# Patient Record
Sex: Male | Born: 1990 | Race: White | Hispanic: No | Marital: Single | Smoking: Never smoker
Health system: Southern US, Community
[De-identification: ages and names within clinical notes are randomized; demographics above are authoritative.]

## PROBLEM LIST (undated history)

## (undated) DIAGNOSIS — S069XAA Unspecified intracranial injury with loss of consciousness status unknown, initial encounter: Secondary | ICD-10-CM

## (undated) DIAGNOSIS — J189 Pneumonia, unspecified organism: Secondary | ICD-10-CM

## (undated) DIAGNOSIS — L0390 Cellulitis, unspecified: Secondary | ICD-10-CM

## (undated) DIAGNOSIS — S069X9A Unspecified intracranial injury with loss of consciousness of unspecified duration, initial encounter: Secondary | ICD-10-CM

## (undated) DIAGNOSIS — R7881 Bacteremia: Secondary | ICD-10-CM

## (undated) DIAGNOSIS — T8579XA Infection and inflammatory reaction due to other internal prosthetic devices, implants and grafts, initial encounter: Secondary | ICD-10-CM

## (undated) HISTORY — PX: PEG TUBE PLACEMENT: SUR1034

## (undated) HISTORY — PX: VENTRICULOPERITONEAL SHUNT: SHX204

## (undated) HISTORY — PX: OTHER SURGICAL HISTORY: SHX169

---
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
## 2011-01-17 LAB — PROTIME-INR: Prothrombin Time: 12.7

## 2011-01-17 LAB — HEMOGLOBIN AND HEMATOCRIT, BLOOD: HCT: 40.1

## 2011-05-14 DIAGNOSIS — G919 Hydrocephalus, unspecified: Secondary | ICD-10-CM | POA: Insufficient documentation

## 2011-05-14 DIAGNOSIS — S069X0S Unspecified intracranial injury without loss of consciousness, sequela: Secondary | ICD-10-CM | POA: Insufficient documentation

## 2011-05-14 DIAGNOSIS — S069X9A Unspecified intracranial injury with loss of consciousness of unspecified duration, initial encounter: Secondary | ICD-10-CM | POA: Insufficient documentation

## 2011-05-14 DIAGNOSIS — S0291XA Unspecified fracture of skull, initial encounter for closed fracture: Secondary | ICD-10-CM | POA: Insufficient documentation

## 2011-05-14 DIAGNOSIS — G911 Obstructive hydrocephalus: Secondary | ICD-10-CM | POA: Insufficient documentation

## 2011-05-14 DIAGNOSIS — R4189 Other symptoms and signs involving cognitive functions and awareness: Secondary | ICD-10-CM | POA: Insufficient documentation

## 2011-05-15 DIAGNOSIS — T7840XA Allergy, unspecified, initial encounter: Secondary | ICD-10-CM | POA: Insufficient documentation

## 2011-05-15 DIAGNOSIS — Z8719 Personal history of other diseases of the digestive system: Secondary | ICD-10-CM | POA: Insufficient documentation

## 2011-12-09 DIAGNOSIS — J189 Pneumonia, unspecified organism: Secondary | ICD-10-CM | POA: Insufficient documentation

## 2011-12-09 HISTORY — DX: Pneumonia, unspecified organism: J18.9

## 2011-12-10 DIAGNOSIS — R7881 Bacteremia: Secondary | ICD-10-CM | POA: Insufficient documentation

## 2011-12-10 HISTORY — DX: Bacteremia: R78.81

## 2012-06-22 ENCOUNTER — Encounter: Payer: Self-pay | Admitting: Pediatrics

## 2012-06-22 ENCOUNTER — Ambulatory Visit (INDEPENDENT_AMBULATORY_CARE_PROVIDER_SITE_OTHER): Payer: Medicaid Other | Admitting: Pediatrics

## 2012-06-22 DIAGNOSIS — G808 Other cerebral palsy: Secondary | ICD-10-CM

## 2012-06-22 NOTE — Procedures (Signed)
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  A 1-1/2 inch noncoring 21-gauge Huebner needle was inserted on the first pass into the reservoir.   The reservoir was drained of 6.5 mL of baclofen placing the reservoir under partial vacuum.  40 mL  (concentration 500 mcg/mL) was placed through a Millipore filter into the reservoir filling it completely.  The reservoir was reprogrammed to reflect a 40 mL volume.  The daily dose is 330.64 mcg per day delivered as a simple continuous infusion. The patient tolerated the procedure well.  The reservoir alarm date is Aug 19, 2012.  He will return on or about that date to empty, refill, and reprogram his intrathecal  baclofen pump.

## 2012-07-30 ENCOUNTER — Telehealth: Payer: Self-pay | Admitting: *Deleted

## 2012-07-30 NOTE — Telephone Encounter (Signed)
Scott Bean left voicemail stating Tegretol level was 11.9 last week at the PCP office.  She states [name] had a seizure last week and one yesterday. 11:53 left message for [name] to call me back to get more information.

## 2012-07-30 NOTE — Telephone Encounter (Signed)
Spoke with Scott Bean.  Last week seizure lasted less than 5 minutes and threw up afterwards.  This morning was less than 5 minutes as well but did not vomit afterwards.  The Tegretol level was done Feb 20th not last week.  The level was 11.9 at that time.  She will be at work after 4pm however she will try to keep her cell phone with her.  The number is [phone].

## 2012-07-30 NOTE — Telephone Encounter (Signed)
[name] called back leaving a voicemail that she was at work and went on break again at [employer].  She will call back then.

## 2012-07-30 NOTE — Telephone Encounter (Signed)
We can't push the carbamazepine any higher.  I'm thinking about starting him on levetiracetam.  I asked mom to call you on Monday to set up a 30 minute visit to discuss changing his medicine.  Talk to me on Monday about how soon we can do that with a regular return visit or if I'm going to have to work him in.Thanks

## 2012-08-02 NOTE — Telephone Encounter (Signed)
Spoke with [name].  They have an appointment on the 13th.  She will discuss the medications at that time.  Spoke with Dr. Sharene Skeans and he agreed.

## 2012-08-02 NOTE — Telephone Encounter (Signed)
Left message for [name] to call me to schedule appointment.  We have openings this week.

## 2012-08-10 DIAGNOSIS — Z79899 Other long term (current) drug therapy: Secondary | ICD-10-CM | POA: Insufficient documentation

## 2012-08-10 DIAGNOSIS — G800 Spastic quadriplegic cerebral palsy: Secondary | ICD-10-CM | POA: Insufficient documentation

## 2012-08-10 DIAGNOSIS — G40309 Generalized idiopathic epilepsy and epileptic syndromes, not intractable, without status epilepticus: Secondary | ICD-10-CM | POA: Insufficient documentation

## 2012-08-10 DIAGNOSIS — G8110 Spastic hemiplegia affecting unspecified side: Secondary | ICD-10-CM | POA: Insufficient documentation

## 2012-08-17 ENCOUNTER — Ambulatory Visit (INDEPENDENT_AMBULATORY_CARE_PROVIDER_SITE_OTHER): Payer: Medicaid Other | Admitting: Pediatrics

## 2012-08-17 DIAGNOSIS — G8110 Spastic hemiplegia affecting unspecified side: Secondary | ICD-10-CM

## 2012-08-17 DIAGNOSIS — G40309 Generalized idiopathic epilepsy and epileptic syndromes, not intractable, without status epilepticus: Secondary | ICD-10-CM

## 2012-08-17 MED ORDER — LEVETIRACETAM 100 MG/ML PO SOLN: ORAL | Status: DC

## 2012-08-18 ENCOUNTER — Encounter: Payer: Self-pay | Admitting: Pediatrics

## 2012-08-18 NOTE — Progress Notes (Signed)
[name] had two seizures both of which lasted less than five minutes.  One was associated with vomiting and the other was not.  These were generalized tonic-clonic events.  The patient had a Tegretol level done on May 27, 2012, which was 11.9 mcg/mL.  I do not believe that we can increase his dose without causing significant side effects.  I discussed the medication levetiracetam and major side effects, which include problems with behavior.  They may benefits to the mediation including its elimination by the kidneys, which significantly lessens interactions between other medications and also the fact that it can be given intravenously.  We will steadily increase his dose by 500 mg each week and divide it morning and evening.  No changes will be made in carbamazepine.  The patient's baclofen pump was emptied, refilled, and reprogramed and this will be described below.  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 A 1-1/2 inch noncoring 21-gauge Huebner needle was inserted on the first pass into the reservoir. The reservoir was drained of 6.5 mL of baclofen placing the reservoir under partial vacuum. 40 mL (concentration 500 mcg/mL) was placed through a Millipore filter into the reservoir filling it completely. The reservoir was reprogrammed to reflect a 40 mL volume. The daily dose is 330.64 mcg per day delivered as a simple continuous infusion. The patient tolerated the procedure well. The reservoir alarm date is October 14, 2012. He will return on or about that date to empty, refill, and reprogram his intrathecal baclofen pump.  Assessment: 1. 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 2. 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  No change in carbamazepine.  At some point if levetiracetam does an excellent job with controlling seizures, we may consider tapering and discontinuing the carbamazepine.  I decreased the alarm volume to 1 mL from 2 mL because so much baclofen is being discarded at the end of each refill.  The patient is stable as far as his spastic quadriparesis is concerned.  We will need to be more aggressive with his seizure treatment.  I do not want this to become another significant medical problem for him.  I do not know why he is having it now.  Physically he is doing well and looked well.  I spent 15 minutes of face-to-face time with him in addition to the time programing the baclofen pump.    Deetta Perla MD

## 2012-08-18 NOTE — Patient Instructions (Signed)
We will introduce levetiracetam to see if we can improve his seizure control.  If this is successful, we may be able to decrease the dose of carbamazepine.  He returns to see me on or about September 14, 2012.

## 2012-08-23 ENCOUNTER — Other Ambulatory Visit: Payer: Self-pay | Admitting: Family

## 2012-09-06 ENCOUNTER — Telehealth: Payer: Self-pay | Admitting: Family

## 2012-09-06 NOTE — Telephone Encounter (Signed)
I would continue it.  This is of concern, but I don't want to stop it now.

## 2012-09-06 NOTE — Telephone Encounter (Signed)
Mom called to report that since starting the Levetiracetam, he did well on 1/2 teaspoon but has been quiet, not laughing or smiling any more on a whole teaspoon. He is not sleepy and has not had any seizures. He started the whole teaspoon twice daily on Friday 09/03/12. Mom asked if she could continue the whole teaspoon dose? TG

## 2012-09-06 NOTE — Telephone Encounter (Signed)
I called Mom with instructions. TG

## 2012-10-14 ENCOUNTER — Ambulatory Visit (INDEPENDENT_AMBULATORY_CARE_PROVIDER_SITE_OTHER): Payer: Medicaid Other | Admitting: Pediatrics

## 2012-10-14 DIAGNOSIS — G8110 Spastic hemiplegia affecting unspecified side: Secondary | ICD-10-CM

## 2012-10-14 DIAGNOSIS — G40309 Generalized idiopathic epilepsy and epileptic syndromes, not intractable, without status epilepticus: Secondary | ICD-10-CM

## 2012-10-15 ENCOUNTER — Encounter: Payer: Self-pay | Admitting: Pediatrics

## 2012-10-15 NOTE — Progress Notes (Signed)
[name] has been seizure free since his last visit.  He is taking and tolerating levetiracetam without significant side effects.  He is involved in a swimming class this summer.  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 A 1-1/2 inch noncoring 21-gauge Huebner needle was inserted on the first pass into the reservoir. The reservoir was drained of 6.0 mL of baclofen placing the reservoir under partial vacuum. 40 mL (concentration 500 mcg/mL) was placed through a Millipore filter into the reservoir filling it completely. The reservoir was reprogrammed to reflect a 40 mL volume. The daily dose is 330.64 mcg per day delivered as a simple continuous infusion. The patient tolerated the procedure well. The reservoir alarm date is December 11, 2012. He will return on or about that date to empty, refill, and reprogram his intrathecal baclofen pump.   Assessment 1.  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 2.  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 3.  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  His pump is working well and spasticity is stable.  No changes will be made in his antiepileptic drugs.  Deetta Perla MD

## 2012-10-20 ENCOUNTER — Telehealth: Payer: Self-pay | Admitting: *Deleted

## 2012-10-20 NOTE — Telephone Encounter (Signed)
I called Mom back and talked with her about the fish oil for traumatic brain injury therapy. I told her that it was experimental and that for that reason, I did not have specific information to give her. I told her that if she chose to give it, that she should monitor him carefully for side effects of GI upset and evidences of bleeding and bruising. She said that she was giving him 1000 mg of fish oil daily now and planned to gradually increase the dose to see if it helped and how he tolerated it. I asked her to stay in touch and let us know how he was doing. TG

## 2012-10-20 NOTE — Telephone Encounter (Signed)
I agree with your advice and recommendations.  I think that it unlikely to help and that he won't tolerate such a high dose.

## 2012-10-20 NOTE — Telephone Encounter (Signed)
Scott Bean the patient's mom called and stated that she had read where a patient who had a brain injury mother is giving him high doses of fish oil she believes 20 grams is what she read in the article and that patient's mom stated that it seems to be helping the patient with the brain injury, Scott Bean is wanting to know if this may be an option for her to try with Scott Bean if it's safe or not. Scott Bean would like a call back to discuss this matter on her mobile at [phone].    Thanks,  Belenda Cruise.

## 2012-12-10 ENCOUNTER — Ambulatory Visit (INDEPENDENT_AMBULATORY_CARE_PROVIDER_SITE_OTHER): Payer: Medicaid Other | Admitting: Pediatrics

## 2012-12-10 DIAGNOSIS — G8110 Spastic hemiplegia affecting unspecified side: Secondary | ICD-10-CM
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
## 2012-12-27 ENCOUNTER — Encounter: Payer: Self-pay | Admitting: Family

## 2012-12-27 ENCOUNTER — Telehealth: Payer: Self-pay | Admitting: *Deleted

## 2012-12-27 ENCOUNTER — Ambulatory Visit (INDEPENDENT_AMBULATORY_CARE_PROVIDER_SITE_OTHER): Payer: Medicaid Other | Admitting: Family

## 2012-12-27 DIAGNOSIS — L0390 Cellulitis, unspecified: Secondary | ICD-10-CM

## 2012-12-27 DIAGNOSIS — L0291 Cutaneous abscess, unspecified: Secondary | ICD-10-CM

## 2012-12-27 HISTORY — DX: Cellulitis, unspecified: L03.90

## 2012-12-27 MED ORDER — VANCOMYCIN HCL 50 MG/ML PO SOLN: 17.0000 mL | Freq: Two times a day (BID) | ORAL | Status: DC

## 2012-12-27 NOTE — Telephone Encounter (Signed)
Mom brought Lerna in today. See office note. TG

## 2012-12-27 NOTE — Telephone Encounter (Signed)
Yes, We need to take a look at it.

## 2012-12-27 NOTE — Telephone Encounter (Signed)
Mom called back and said that she may be able to bring him today but needs a fax sent to transportation service saying that it was a medical necessity. She asked for the note to be faxed to [facility] @ [phone]. I faxed the note as requested. TG

## 2012-12-27 NOTE — Patient Instructions (Signed)
Start Vancomycin 17ml via tube twice per day.  Call on Weds 12/29/12 to let us know how he is doing. Call sooner if needed.  Dr Sharene Skeans will contact Dr Samson Frederic to discuss the cellulitis over the Baclofen site. [name] may need to be seen by Dr Samson Frederic if this does not improve.

## 2012-12-27 NOTE — Telephone Encounter (Signed)
[name] the patient's mom called and stated that the patient has been sick with fever for a few day last week and that normally when he has a fever his pump site will turn red, well he is a little better now not running a fever at all and his pump site is red and swollen and mom is wanting to know what she should do if anything and if this is okay that this is still looking the way that it is. [name] would like a return call to discuss this matter at [phone].      Thanks,  Belenda Cruise.

## 2012-12-27 NOTE — Telephone Encounter (Signed)
Dr Sharene Skeans, do you want me to bring [facility] in (on my schedule) today to view the red, swollen pump site? Inetta Fermo

## 2012-12-27 NOTE — Progress Notes (Signed)
[name] returns today on urgent basis because his Mother called today to report that he had redness and swelling over his pump site. Mom said that he had fever at the end of last week and when the fever ended that he had redness and swelling over the pump site. He has not had increase in spasticity.  I asked her to bring him in so that we could evaluate him. Upon examination, the pump site has erythema, edema and warmth directly over the site, in a diameter of approximately 4 inches. He cries and grimaces when the pump site is palpated. I consulted with Dr. Sharene Skeans regarding this patient. He came in to see the patient and talked with his mother. Dr Sharene Skeans explained that [name] has symptoms of cellulitis on his right lower abdomen over the pump site. We will start him on Vancomycin for 10 days to treat the infection. Mom was instructed to call on Wednesday 12/29/12 to report on his condition or sooner if needed. Dr Sharene Skeans plans to call Dr Samson Frederic at [facility] to discuss the case with him. I called [facility] and consulted a pharmacist regarding an appropriate dose for the Vancomycin, which will be given through the gastrostomy tube. The pharmacist recommended 30mg /kg of Vancomycin 10m/ml. I sent the order to [facility], who will compound the medication for the patient. Mom knows to call if [name]'s condition worsens, if he becomes lethargic, very restless or in any way behaving differently than usual.

## 2012-12-27 NOTE — Telephone Encounter (Signed)
I called Mom and asked her to bring [place] in. She will see if she can get a ride and call me back. TG

## 2012-12-28 ENCOUNTER — Telehealth: Payer: Self-pay | Admitting: Pediatrics

## 2012-12-28 ENCOUNTER — Encounter: Payer: Self-pay | Admitting: *Deleted

## 2012-12-28 NOTE — Telephone Encounter (Signed)
Mother says that he is better today.  His is day 2 of vancomycin.  I contacted Dr. Fredric Dine at [facility] and he recommended an evaluation in the office on Thursday.  Telephone [phone] Tresa Endo).  Asked mother to call and make an appointment.  If the cellulitis is substantially better, he may not need the appointment.  I told mother to make pictures each day of the lesion so that we could see its progression.

## 2012-12-29 ENCOUNTER — Telehealth: Payer: Self-pay | Admitting: *Deleted

## 2012-12-29 NOTE — Telephone Encounter (Signed)
Noted, agree with plan . No additional action taken.

## 2012-12-29 NOTE — Telephone Encounter (Signed)
[name] the patient's mom called and has said that since using the cream on Scott Bean the pump site looks better however she still is going to keep the appointment tomorrow with Dr. Samson Frederic to be on the safe side and have him check it out. If Dr. Sharene Skeans wants to call her back she can be reached at [phone].      Thanks,  Belenda Cruise.

## 2012-12-30 ENCOUNTER — Encounter: Payer: Self-pay | Admitting: *Deleted

## 2012-12-30 ENCOUNTER — Telehealth: Payer: Self-pay | Admitting: *Deleted

## 2012-12-30 NOTE — Telephone Encounter (Signed)
I tried to see the patient's mother and we were unable to keep a connection.  I agree with this plan.

## 2012-12-30 NOTE — Telephone Encounter (Signed)
[name] the patient's mom called and stated that she and Scott Bean have just left Dr. Manfred Shirts office and Scott Bean is going to have surgery tomorrow at 9:00 am to replace the pump and put it on the other side of his stomach he will then have to stay for 2 to 3 weeks for antibiotic treatment. Mom can be reached at [phone].      Thanks,  Belenda Cruise.

## 2013-01-05 ENCOUNTER — Encounter: Payer: Self-pay | Admitting: Family

## 2013-01-05 NOTE — Telephone Encounter (Signed)
This encounter was created in error - please disregard.

## 2013-01-10 ENCOUNTER — Encounter: Payer: Self-pay | Admitting: *Deleted

## 2013-01-14 ENCOUNTER — Other Ambulatory Visit: Payer: Self-pay | Admitting: Family

## 2013-01-14 DIAGNOSIS — G40309 Generalized idiopathic epilepsy and epileptic syndromes, not intractable, without status epilepticus: Secondary | ICD-10-CM

## 2013-01-25 ENCOUNTER — Encounter: Payer: Self-pay | Admitting: Pediatrics

## 2013-01-25 ENCOUNTER — Ambulatory Visit (INDEPENDENT_AMBULATORY_CARE_PROVIDER_SITE_OTHER): Payer: Medicaid Other | Admitting: Pediatrics

## 2013-01-25 DIAGNOSIS — G40309 Generalized idiopathic epilepsy and epileptic syndromes, not intractable, without status epilepticus: Secondary | ICD-10-CM

## 2013-01-25 DIAGNOSIS — G8110 Spastic hemiplegia affecting unspecified side: Secondary | ICD-10-CM
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
## 2013-01-26 ENCOUNTER — Encounter: Payer: Self-pay | Admitting: Pediatrics

## 2013-02-04 ENCOUNTER — Encounter: Payer: Medicaid Other | Admitting: Pediatrics

## 2013-02-06 DIAGNOSIS — T8579XA Infection and inflammatory reaction due to other internal prosthetic devices, implants and grafts, initial encounter: Secondary | ICD-10-CM

## 2013-02-06 HISTORY — DX: Infection and inflammatory reaction due to other internal prosthetic devices, implants and grafts, initial encounter: T85.79XA

## 2013-03-08 ENCOUNTER — Encounter: Payer: Self-pay | Admitting: Pediatrics

## 2013-03-08 ENCOUNTER — Ambulatory Visit (INDEPENDENT_AMBULATORY_CARE_PROVIDER_SITE_OTHER): Payer: Medicaid Other | Admitting: Pediatrics

## 2013-03-08 DIAGNOSIS — G8110 Spastic hemiplegia affecting unspecified side: Secondary | ICD-10-CM

## 2013-03-08 DIAGNOSIS — G40309 Generalized idiopathic epilepsy and epileptic syndromes, not intractable, without status epilepticus: Secondary | ICD-10-CM

## 2013-03-08 NOTE — Progress Notes (Signed)
Patient: Scott Bean MRN: [id number] Sex: male DOB: 1990/11/10  Provider: Deetta Perla, MD Location of Care: [facility] Child Neurology  Note type: Routine return visit  History of Present Illness: Referral Source: Dr. Delaney Meigs History from: mother and [facility] chart Chief Complaint: Baclofen Pump Refill  Scott Bean is a 22 y.o. male who returns for evaluation and management of his generalized spasticity treated with an intrathecal baclofen pump.  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 A 1-1/2 inch noncoring 21-gauge Huebner needle was inserted on the third pass into the reservoir. The reservoir was drained of 3.0 mL of baclofen placing the reservoir under partial vacuum. 40 mL (concentration 500 mcg/mL) was placed through a Millipore filter into the reservoir filling it completely. The reservoir was reprogrammed to reflect a 40 mL volume. The daily dose was increased to 347.52 mcg per day delivered as a simple continuous infusion.(5% increased do to increase spasticity in his legs) The patient tolerated the procedure well. The reservoir alarm date is May 03, 2013.  He will return on or about that day to empty, refill, and reprogram his intrathecal baclofen pump.  His catheter is now at T2. Estimated ERI 79 months.   Review of Systems: 12 system review was unremarkable  No past medical history on file. Hospitalizations: yes, Head Injury: no, Nervous System Infections: no, Immunizations up to date: yes Past Medical History Comments: See surgical Hx for hospitalizations.  Behavior History none  Surgical History Past Surgical History  Procedure Laterality Date  . Decompressive craniotomy    . Peg tube placement    . Ventriculoperitoneal shunt    . Implanation of  intrathecal baclofen pump    . Reimplantation of intrathecal baclofen pump      Family History family history includes Heart Problems in his maternal grandfather. Family History is negative migraines, seizures, cognitive impairment, blindness, deafness, birth defects, chromosomal disorder, autism.  Social History History   Social History  . Marital Status: Single    Spouse Name: N/A    Number of Children: N/A  . Years of Education: N/A   Social History Main Topics  . Smoking status: Never Smoker   . Smokeless tobacco: Never Used  . Alcohol Use: No  . Drug Use: No  . Sexual Activity: No   Other Topics Concern  . None   Social History Narrative  . None   Educational level 12th grade School Attending: [employer]. Occupation: Consulting civil engineer  Living with mother and maternal grandmother  Hobbies/Interest: Horses School comments [name] is doing well in school he will graduate from [employer] in June 2015.  Current Outpatient Prescriptions on File Prior to Visit  Medication Sig Dispense Refill  . aluminum-magnesium hydroxide-simethicone (MAALOX) 200-200-20 MG/5ML SUSP       . carBAMazepine (TEGRETOL) 100 MG/5ML suspension 100 mg/kg/day. 16.76ml per PEG four times daily      . carBAMazepine (TEGRETOL) 100 MG/5ML suspension TAKE 16.5 ML BY MOUTH EVERY 6 HOURS  2245 mL  5  . cetirizine HCl (ZYRTEC) 5 MG/5ML SYRP 1 mg. 1 mg by Tube route daily as needed.      . cloNIDine (CATAPRES - DOSED IN MG/24 HR) 0.1 mg/24hr patch 1 patch. Place 1 patch onto the skin once a week. On Sunday.      . levETIRAcetam (KEPPRA) 100 MG/ML  solution 2.5 mL twice daily x1 week, then 5 mL twice daily x1 week, then 7.5 mL twice daily  500 mL  5  . Omega-3 Fatty Acids (OMEGA-3 FISH OIL) 1200 MG CAPS 2 capsules. 2 capsules by PEG Tube route daily. Pierce capsules and mix with medications as directed      . ranitidine (ZANTAC) 15 MG/ML syrup 150 mg. 150 mg by PEG Tube route 2 times daily.      Marland Kitchen  tetrahydrozoline 0.05 % ophthalmic solution 1 drop. Place 1 drop into the left eye 3 times daily. As directed.      . Vancomycin HCl 50 MG/ML SOLN Take 17 mLs by mouth 2 (two) times daily. Give 17 ml via tube twice per day for 10 days      . Vitamin D, Ergocalciferol, (DRISDOL) 50000 UNITS CAPS capsule 50,000 Units by PEG Tube route once a week. On Sunday       No current facility-administered medications on file prior to visit.   The medication list was reviewed and reconciled. All changes or newly prescribed medications were explained.  A complete medication list was provided to the patient/caregiver.  No Known Allergies  Physical Exam There were no vitals taken for this visit.  Assessment Spastic hemiplegia affecting the nondominant and dominant side secondary to traumatic brain injury. 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

## 2013-03-09 ENCOUNTER — Other Ambulatory Visit: Payer: Self-pay | Admitting: Family

## 2013-03-09 DIAGNOSIS — G40309 Generalized idiopathic epilepsy and epileptic syndromes, not intractable, without status epilepticus: Secondary | ICD-10-CM

## 2013-03-09 MED ORDER — LEVETIRACETAM 100 MG/ML PO SOLN: ORAL | Status: DC

## 2013-05-02 ENCOUNTER — Encounter: Payer: Self-pay | Admitting: Pediatrics

## 2013-05-02 ENCOUNTER — Ambulatory Visit (INDEPENDENT_AMBULATORY_CARE_PROVIDER_SITE_OTHER): Payer: Medicaid Other | Admitting: Pediatrics

## 2013-05-02 DIAGNOSIS — G8110 Spastic hemiplegia affecting unspecified side: Secondary | ICD-10-CM

## 2013-05-02 NOTE — Progress Notes (Signed)
Patient: Scott Bean MRN: [id number] Sex: male DOB: 10-29-1990  Provider: Deetta Perla, MD Location of Care: [facility] Neurology  Note type: Routine return visit  History of Present Illness: Referral Source: Dr. Delaney Meigs History from: mother and [facility] chart Chief Complaint: Baclofen Pump Refill  Scott Bean is a 23 y.o. male Who returns for evaluation and management of spastic double hemi-paresisfrom traumatic brain injury.  He is here to empty refill and reprogram intrathecal Baclofen pump.  His mother tells me that since increasing his dose last time, that the left leg remains very stiff and rigid.  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 A 1-1/2 inch noncoring 21-gauge Huebner needle was inserted on the fourth pass into the reservoir. The reservoir was drained of 3.2 mL of baclofen placing the reservoir under partial vacuum. 40 mL (concentration 500 mcg/mL) was placed through a Millipore filter into the reservoir filling it completely. The reservoir was reprogrammed to reflect a 40 mL volume. The daily dose was increased to 380.03 mcg delivered as a simple continuous infusion.(9% increased do to increased spasticity/rigidity in his left leg) The patient tolerated the procedure well. The reservoir alarm date is June 22, 2013. He will return on or about that day to empty, refill, and reprogram his intrathecal baclofen pump. His catheter is now at T2.  Estimated ERI 77 months.  Asked his mother to shave the central area were the reservoir nipple exists on the day of the procedure to decrease the chances of contamination.  Review of Systems: 12 system review was unremarkable  No past medical history on file. Hospitalizations: yes, Head Injury: no, Nervous System  Infections: no, Immunizations up to date: yes Past Medical History Comments: See surgical Hx for hospitalizations.  Behavior History none  Surgical History Past Surgical History  Procedure Laterality Date  . Decompressive craniotomy    . Peg tube placement    . Ventriculoperitoneal shunt    . Implanation of intrathecal baclofen pump    . Reimplantation of intrathecal baclofen pump      Family History family history includes Heart Problems in his maternal grandfather. Family History is negative migraines, seizures, cognitive impairment, blindness, deafness, birth defects, chromosomal disorder, autism.  Social History History   Social History  . Marital Status: Single    Spouse Name: N/A    Number of Children: N/A  . Years of Education: N/A   Social History Main Topics  . Smoking status: Never Smoker   . Smokeless tobacco: Never Used  . Alcohol Use: No  . Drug Use: No  . Sexual Activity: No   Other Topics Concern  . None   Social History Narrative  . None   Educational level 12th grade special education School Attending: [employer]. Occupation: Consulting civil engineer  Living with mother and maternal grandmother  Hobbies/Interest: Enjoys horse and water therapy. School comments [name] will graduate from [employer] in June of 2015.  Current Outpatient Prescriptions on File Prior to Visit  Medication Sig Dispense Refill  . aluminum-magnesium hydroxide-simethicone (MAALOX) 200-200-20 MG/5ML SUSP       . carBAMazepine (TEGRETOL) 100 MG/5ML suspension 100 mg/kg/day. 16.70ml per PEG four times daily      . carBAMazepine (TEGRETOL) 100 MG/5ML suspension TAKE 16.5 ML BY MOUTH EVERY 6 HOURS  2245 mL  5  . cetirizine  HCl (ZYRTEC) 5 MG/5ML SYRP 1 mg. 1 mg by Tube route daily as needed.      . cloNIDine (CATAPRES - DOSED IN MG/24 HR) 0.1 mg/24hr patch 1 patch. Place 1 patch onto the skin once a week. On Sunday.      . levETIRAcetam (KEPPRA) 100 MG/ML solution Give 7.5 mL  twice daily  500 mL  5  . Omega-3 Fatty Acids (OMEGA-3 FISH OIL) 1200 MG CAPS 2 capsules. 2 capsules by PEG Tube route daily. Pierce capsules and mix with medications as directed      . ranitidine (ZANTAC) 15 MG/ML syrup 150 mg. 150 mg by PEG Tube route 2 times daily.      Marland Kitchen. tetrahydrozoline 0.05 % ophthalmic solution 1 drop. Place 1 drop into the left eye 3 times daily. As directed.      . Vancomycin HCl 50 MG/ML SOLN Take 17 mLs by mouth 2 (two) times daily. Give 17 ml via tube twice per day for 10 days      . Vitamin D, Ergocalciferol, (DRISDOL) 50000 UNITS CAPS capsule 50,000 Units by PEG Tube route once a week. On Sunday       No current facility-administered medications on file prior to visit.   The medication list was reviewed and reconciled. All changes or newly prescribed medications were explained.  A complete medication list was provided to the patient/caregiver.  No Known Allergies  Physical Exam There were no vitals taken for this visit.  Patient has increased rigidity in the left leg I can bend it, but it causes him some pain.  Once I do so, it's easier to move; it still remains at least [place] 3.  Assessment Spastic double hemi-paresis  Emptying refilling and reprogramming of intrathecal Baclofen pump occurred without complications.  Deetta PerlaWilliam H Hickling MD

## 2013-06-16 ENCOUNTER — Other Ambulatory Visit: Payer: Self-pay | Admitting: Family

## 2013-06-21 ENCOUNTER — Ambulatory Visit (INDEPENDENT_AMBULATORY_CARE_PROVIDER_SITE_OTHER): Payer: Medicaid Other | Admitting: Pediatrics

## 2013-06-21 ENCOUNTER — Encounter: Payer: Self-pay | Admitting: Pediatrics

## 2013-06-21 DIAGNOSIS — G8110 Spastic hemiplegia affecting unspecified side: Secondary | ICD-10-CM

## 2013-06-21 NOTE — Progress Notes (Signed)
Patient: Scott Bean MRN: [id number] Sex: male DOB: 02/21/1991  Provider: Deetta PerlaHICKLING,Cobie Leidner H, MD [facility]  Note type: Routine return visit  History of Present Illness: Referral Source: Dr. Delaney MeigsLeonard R. Nyland History from: mother and [facility] chart Chief Complaint: Baclofen Pump Refill  Scott Bean is a 23 y.o. 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  Scott Bean is a 23 y.o. male Who returns for evaluation and management of spastic double hemi-paresisfrom traumatic brain injury. He is here to empty refill and reprogram intrathecal Baclofen pump. His mother tells me that since increasing his dose last time, that the left leg remains very stiff and rigid.   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 A 1-1/2 inch noncoring 21-gauge Huebner needle was inserted after several passes into the reservoir. The reservoir was drained of 2.5 mL of baclofen placing the reservoir under partial vacuum. 40 mL (concentration 500 mcg/mL) was placed through a Millipore filter into the reservoir filling it completely. The reservoir was reprogrammed to reflect a 40 mL volume. The daily dose was increased to 420.22 mcg delivered as a simple continuous infusion.(11% increase due to increased spasticity/rigidity in his left leg) The patient tolerated the procedure well. The reservoir alarm date is Aug 06, 2013. He will return on or about that day to empty, refill, and reprogram his intrathecal baclofen pump. His catheter is now at T2.  Estimated ERI 75 months.  Review of Systems: 12 system review was remarkable for in spasticity in his legs and arms,  preserved vision, limited ability to understand and communicate  No past medical history on file. Hospitalizations: yes, Head Injury: no, Nervous System Infections: no, Immunizations up to date: yes Past Medical History Comments: See Hx for hospitalizations.  Behavior History none  Surgical History Past Surgical History  Procedure Laterality Date  . Decompressive craniotomy    . Peg tube placement    . Ventriculoperitoneal shunt    . Implanation of intrathecal baclofen pump    . Reimplantation of intrathecal baclofen pump      Family History family history includes Heart Problems in his maternal grandfather. Family History is negative migraines, seizures, cognitive impairment, blindness, deafness, birth defects, chromosomal disorder, autism.  Social History History   Social History  . Marital Status: Single    Spouse Name: N/A    Number of Children: N/A  . Years of Education: N/A   Social History Main Topics  . Smoking status: Never Smoker   . Smokeless tobacco: Never Used  . Alcohol Use: No  . Drug Use: No  . Sexual Activity: No   Other Topics Concern  . None   Social History Narrative  . None   Educational level 12th grade special education School Attending: [employer]. Occupation: Consulting civil engineertudent  Living with mother and maternal grandmother  Hobbies/Interest: Enjoys horse and water therapy and being around his friends at school. School comments [name] will graduate June of 2015, he's doing well in school.   Current Outpatient Prescriptions on File Prior to Visit  Medication Sig Dispense Refill  . aluminum-magnesium hydroxide-simethicone (MAALOX) 200-200-20 MG/5ML SUSP       . carBAMazepine (TEGRETOL) 100 MG/5ML suspension 100 mg/kg/day. 16.425ml per PEG four times daily      .  carBAMazepine (TEGRETOL) 100 MG/5ML suspension TAKE 16.5 ML BY MOUTH EVERY 6 HOURS  2244 mL  5  . cetirizine HCl (ZYRTEC) 5 MG/5ML SYRP 1 mg. 1 mg by Tube route daily as needed.      .  cloNIDine (CATAPRES - DOSED IN MG/24 HR) 0.1 mg/24hr patch 1 patch. Place 1 patch onto the skin once a week. On Sunday.      . levETIRAcetam (KEPPRA) 100 MG/ML solution Give 7.5 mL twice daily  500 mL  5  . Omega-3 Fatty Acids (OMEGA-3 FISH OIL) 1200 MG CAPS 2 capsules. 2 capsules by PEG Tube route daily. Pierce capsules and mix with medications as directed      . ranitidine (ZANTAC) 15 MG/ML syrup 150 mg. 150 mg by PEG Tube route 2 times daily.      Marland Kitchen tetrahydrozoline 0.05 % ophthalmic solution 1 drop. Place 1 drop into the left eye 3 times daily. As directed.      . Vancomycin HCl 50 MG/ML SOLN Take 17 mLs by mouth 2 (two) times daily. Give 17 ml via tube twice per day for 10 days      . Vitamin D, Ergocalciferol, (DRISDOL) 50000 UNITS CAPS capsule 50,000 Units by PEG Tube route once a week. On Sunday       No current facility-administered medications on file prior to visit.   The medication list was reviewed and reconciled. All changes or newly prescribed medications were explained.  A complete medication list was provided to the patient/caregiver.  No Known Allergies  Physical Exam There were no vitals taken for this visit. He has severe spasticity in his left leg more so than the right.  Assessment 1.  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  Plan Emptying refilling and reprogramming of intrathecal Baclofen pump occurred without complications.  Deetta Perla MD

## 2013-08-05 ENCOUNTER — Ambulatory Visit (INDEPENDENT_AMBULATORY_CARE_PROVIDER_SITE_OTHER): Payer: Medicaid Other | Admitting: Pediatrics

## 2013-08-05 ENCOUNTER — Encounter: Payer: Self-pay | Admitting: Pediatrics

## 2013-08-05 ENCOUNTER — Encounter: Payer: Medicaid Other | Admitting: Pediatrics

## 2013-08-05 DIAGNOSIS — G8110 Spastic hemiplegia affecting unspecified side: Secondary | ICD-10-CM

## 2013-08-05 NOTE — Progress Notes (Signed)
Patient: Scott Bean MRN: [id number] Sex: male DOB: April 07, 1991  Provider: Deetta PerlaHICKLING,Marielys Trinidad H, MD [facility]  Note type: Routine return visit  History of Present Illness: Referral Source: Dr. Delaney MeigsLeonard R. Nyland [facility] Chief Complaint: Baclofen Pump Refill  Scott Bean is a 23 y.o. male who returns empty, refill, and reprogram his intrathecal baclofen pump.  Scott Bean is a 23 y.o. 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   Scott RodesCody Bean is a 23 y.o. male Who returns for evaluation and management of spastic double hemi-paresisfrom traumatic brain injury. He is here to empty refill and reprogram intrathecal Baclofen pump. His mother tells me that since increasing his dose last time, that the left leg remains very stiff and rigid.   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 A 1-1/2 inch and 3 inch noncoring 21-gauge Huebner needle was inserted after several passes into the reservoir. I had difficulty correctly finding the central reservoir.  The reservoir was drained of 4.5 mL of baclofen placing the reservoir under partial vacuum. 40 mL (concentration 500 mcg/mL) was placed through a Millipore filter into the reservoir filling it completely. The reservoir was reprogrammed to reflect a 40 mL volume. The daily dose was 420.22 mcg delivered as a simple continuous infusion. The patient tolerated the procedure well. The reservoir alarm date is September 20, 2013. He will return on or about that day to empty, refill, and reprogram his intrathecal baclofen pump. His catheter is now at T2. The reservoir has  been marked with a dark Sharpie pen. Estimated ERI 74 months.   Review of Systems: 12 system review was unremarkable  No past medical history on file. Hospitalizations: yes, Head Injury: no, Nervous System Infections: no, Immunizations up to date: yes Past Medical History Comments: See surgical Hx for hospitalizations.  Behavior History none  Surgical History Past Surgical History  Procedure Laterality Date  . Decompressive craniotomy    . Peg tube placement    . Ventriculoperitoneal shunt    . Implanation of intrathecal baclofen pump    . Reimplantation of intrathecal baclofen pump      Family History family history includes Heart Problems in his maternal grandfather. Family History is negative for migraines, seizures, cognitive impairment, blindness, deafness, birth defects, chromosomal disorder, or autism.  Social History History   Social History  . Marital Status: Single    Spouse Name: N/A    Number of Children: N/A  . Years of Education: N/A   Social History Main Topics  . Smoking status: Never Smoker   . Smokeless tobacco: Never Used  . Alcohol Use: No  . Drug Use: No  . Sexual Activity: No   Other Topics Concern  . None   Social History Narrative  . None   Educational level special education 12 th School Attending: [employer]. Occupation: Consulting civil engineertudent  Living with mother and maternal grandmother Hobbies/Interest: Enjoys horse and water therapy and being around friends at school.  School comments Scott BattenCody is doing well in school, he graduates June of 2015.  Current Outpatient Prescriptions on File Prior to Visit  Medication Sig Dispense Refill  . aluminum-magnesium hydroxide-simethicone (MAALOX) 200-200-20 MG/5ML SUSP       .  carBAMazepine (TEGRETOL) 100 MG/5ML suspension 100 mg/kg/day. 16.365ml per PEG four times daily      . carBAMazepine (TEGRETOL) 100 MG/5ML suspension TAKE 16.5 ML BY MOUTH EVERY 6 HOURS  2244 mL  5  . cetirizine HCl (ZYRTEC) 5  MG/5ML SYRP 1 mg. 1 mg by Tube route daily as needed.      . cloNIDine (CATAPRES - DOSED IN MG/24 HR) 0.1 mg/24hr patch 1 patch. Place 1 patch onto the skin once a week. On Sunday.      . levETIRAcetam (KEPPRA) 100 MG/ML solution Give 7.5 mL twice daily  500 mL  5  . Omega-3 Fatty Acids (OMEGA-3 FISH OIL) 1200 MG CAPS 2 capsules. 2 capsules by PEG Tube route daily. Pierce capsules and mix with medications as directed      . ranitidine (ZANTAC) 15 MG/ML syrup 150 mg. 150 mg by PEG Tube route 2 times daily.      Marland Kitchen. tetrahydrozoline 0.05 % ophthalmic solution 1 drop. Place 1 drop into the left eye 3 times daily. As directed.      . Vancomycin HCl 50 MG/ML SOLN Take 17 mLs by mouth 2 (two) times daily. Give 17 ml via tube twice per day for 10 days      . Vitamin D, Ergocalciferol, (DRISDOL) 50000 UNITS CAPS capsule 50,000 Units by PEG Tube route once a week. On Sunday       No current facility-administered medications on file prior to visit.   The medication list was reviewed and reconciled. All changes or newly prescribed medications were explained.  A complete medication list was provided to the patient/caregiver.  No Known Allergies  Physical Exam There were no vitals taken for this visit. He has spastic double hemiplegia with Ashworth scores of approximately 2.  Assessment 1. 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   Plan  Emptying refilling and reprogramming of intrathecal Baclofen pump occurred without complications.  Deetta PerlaWilliam H Sulayman Manning MD

## 2013-09-19 ENCOUNTER — Ambulatory Visit (INDEPENDENT_AMBULATORY_CARE_PROVIDER_SITE_OTHER): Payer: Medicaid Other | Admitting: Pediatrics

## 2013-09-19 ENCOUNTER — Ambulatory Visit (HOSPITAL_COMMUNITY)
Admission: RE | Admit: 2013-09-19 | Discharge: 2013-09-19 | Disposition: A | Discharge status: Home / Self Care | Payer: Medicaid Other | Source: Ambulatory Visit | Attending: Pediatrics | Admitting: Pediatrics

## 2013-09-19 ENCOUNTER — Encounter: Payer: Self-pay | Admitting: Pediatrics

## 2013-09-19 DIAGNOSIS — G8190 Hemiplegia, unspecified affecting unspecified side: Secondary | ICD-10-CM | POA: Insufficient documentation

## 2013-09-19 DIAGNOSIS — G8110 Spastic hemiplegia affecting unspecified side: Secondary | ICD-10-CM

## 2013-09-19 DIAGNOSIS — G40309 Generalized idiopathic epilepsy and epileptic syndromes, not intractable, without status epilepticus: Secondary | ICD-10-CM

## 2013-09-19 DIAGNOSIS — K6389 Other specified diseases of intestine: Secondary | ICD-10-CM | POA: Insufficient documentation

## 2013-09-19 NOTE — Progress Notes (Signed)
Patient: Scott Bean MRN: [id number] Sex: male DOB: 1990/04/20  Provider: Deetta PerlaHICKLING,WILLIAM H, MD [facility]  Note type: Routine return visit  History of Present Illness: Referral Source: Dr. Delaney MeigsLeonard R. Nyland  History from: mother and [facility] chart Chief Complaint: Baclofen Pump Refill  Scott Bean is a 23 y.o. male who returns for emptying, refilling, and reprogramming of intrathecal baclofen pump.  He has spastic double hemi-paresis from traumatic brain injury.  Scott Bean has not had any significant change in his spasticity since last visit.  His mother did not request change in the rate of infusion of baclofen.  There have been no seizures.  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 A 1-1/2 inch and 3 inch noncoring 21-gauge Huebner needle was inserted after several passes into the reservoir. I  Again had difficulty correctly finding the central reservoir. The site of entry was marked with an indelible marker, but prove to be inaccurate.  I obtained a plain film of his abdomen to determine the positioning of the pump and then entered the reservoir on the first pass.  The reservoir was drained of 4.6 mL of baclofen placing the reservoir under partial vacuum. 40 mL (concentration 500 mcg/mL) was placed through a Millipore filter into the reservoir filling it completely. The reservoir was reprogrammed to reflect a 40 mL volume. The daily dose was 420.22 mcg delivered as a simple continuous infusion. He tolerated the procedure well. The reservoir alarm date is November 04, 2013. He will return on or about that day to empty, refill, and reprogram his intrathecal baclofen pump. His catheter is now at T2. Estimated ERI 72 months.  Review of Systems: 12 system review was unremarkable  No  past medical history on file. Hospitalizations: no, Head Injury: no, Nervous System Infections: no, Immunizations up to date: yes Past Medical History Comments: No recent hospitalizations or recent head injuries.  Behavior History none  Surgical History Past Surgical History  Procedure Laterality Date  . Decompressive craniotomy    . Peg tube placement    . Ventriculoperitoneal shunt    . Implanation of intrathecal baclofen pump    . Reimplantation of intrathecal baclofen pump      Family History family history includes Heart Problems in his maternal grandfather. Family History is negative for migraines, seizures, cognitive impairment, blindness, deafness, birth defects, chromosomal disorder, or autism.  Social History History   Social History  . Marital Status: Single    Spouse Name: N/A    Number of Children: N/A  . Years of Education: N/A   Social History Main Topics  . Smoking status: Never Smoker   . Smokeless tobacco: Never Used  . Alcohol Use: No  . Drug Use: No  . Sexual Activity: No   Other Topics Concern  . None   Social History Narrative  . None   Educational level 12 th special education Living with mother and maternal grandmother  Hobbies/Interest: Enjoys horse and water therapy School comments Scott Bean completed school on September 17, 2013 at [employer].   Current Outpatient Prescriptions on File Prior to Visit  Medication Sig Dispense Refill  . aluminum-magnesium hydroxide-simethicone (MAALOX) 200-200-20 MG/5ML SUSP       . carBAMazepine (TEGRETOL) 100 MG/5ML suspension 100 mg/kg/day. 16.315ml per PEG four times daily      . carBAMazepine (TEGRETOL) 100  MG/5ML suspension TAKE 16.5 ML BY MOUTH EVERY 6 HOURS  2244 mL  5  . cetirizine HCl (ZYRTEC) 5 MG/5ML SYRP 1 mg. 1 mg by Tube route daily as needed.      . cloNIDine (CATAPRES - DOSED IN MG/24 HR) 0.1 mg/24hr patch 1 patch. Place 1 patch onto the skin once a week. On Sunday.      . levETIRAcetam  (KEPPRA) 100 MG/ML solution Give 7.5 mL twice daily  500 mL  5  . Omega-3 Fatty Acids (OMEGA-3 FISH OIL) 1200 MG CAPS 2 capsules. 2 capsules by PEG Tube route daily. Pierce capsules and mix with medications as directed      . ranitidine (ZANTAC) 15 MG/ML syrup 150 mg. 150 mg by PEG Tube route 2 times daily.      Marland Kitchen. tetrahydrozoline 0.05 % ophthalmic solution 1 drop. Place 1 drop into the left eye 3 times daily. As directed.      . Vancomycin HCl 50 MG/ML SOLN Take 17 mLs by mouth 2 (two) times daily. Give 17 ml via tube twice per day for 10 days      . Vitamin D, Ergocalciferol, (DRISDOL) 50000 UNITS CAPS capsule 50,000 Units by PEG Tube route once a week. On Sunday       No current facility-administered medications on file prior to visit.   The medication list was reviewed and reconciled. All changes or newly prescribed medications were explained.  A complete medication list was provided to the patient/caregiver.  No Known Allergies  Physical Exam There were no vitals taken for this visit.  He has increased tone in his arms and left leg.  The right leg is less affected.  Assessment 1. 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   Plan He will return on or about July 31 to empty refill and reprogram his intrathecal baclofen pump.  Deetta PerlaWilliam H Hickling, MD

## 2013-10-06 ENCOUNTER — Telehealth: Payer: Self-pay | Admitting: *Deleted

## 2013-10-06 DIAGNOSIS — G40309 Generalized idiopathic epilepsy and epileptic syndromes, not intractable, without status epilepticus: Secondary | ICD-10-CM

## 2013-10-06 DIAGNOSIS — Z79899 Other long term (current) drug therapy: Secondary | ICD-10-CM

## 2013-10-06 NOTE — Telephone Encounter (Signed)
Scott Bean the patient's mom called and stated that around 7:30 pm last night she received a call while at work saying that Scott Bean was kicking his legs out and when she arrived home around 9:30 pm he was very tired and his eyes looked weird. She feels that possibly he had a seizure and may also have a UTI because he has not urinated at all this morning. She also mentioned that he has red rash in between his legs that the doctor prescribed a cream for and it seems to be getting better. Scott Bean was wanting Dr. Sharene SkeansHickling to call in something for the possible UTI, I explained to mom that request would have to go through the patient's PCP office and she conformed understanding and said she would call them about the UTI possibility.   Mom would like a return call to discuss rather or not Scott Bean had a seizure, Scott Bean can be reached at [phone].    Thanks,  Belenda CruiseMichelle B.

## 2013-10-06 NOTE — Telephone Encounter (Signed)
I called and left Mom a message. She called me back and I talked with her about Dr Darl HouseholderHickling's recommendations. She said that she could take Scott Bean to have blood drawn next week at Dr Joyce CopaNyland's office. I called Dr Joyce CopaNyland's office @ [phone] but the office was closed. I will call them on Monday to arrange the blood test. I will call Mom on Monday to let her know and will arrange for the timing for his medication on the day of the test. Mom agreed with these plans. TG

## 2013-10-06 NOTE — Telephone Encounter (Signed)
Mom called me back. She said that Scott Bean will sometimes kick his legs if he is uncomfortable and need change in position. Caregiver came in to room yesterday evening and found him kicking legs and thought his face looked a little funny so he moved him from chair to bed, and called Mom at work to report. Said that he did not have loss of awareness that he saw. Mom came home soon after and thought that Scott Bean looked unusually tired and that his eyes were "jumping" back and forth in unusual way. He was otherwise awake and seemed ok. Eye movements did not last long and he was just tired afterwards and went to sleep as usual. Scott Bean has had a fungal groin rash that has been uncomfortable. He has had decreased urine output and some odor to urine. Mom called PCP this AM and took sample urine to their office. They are going to prescribe antibiotic for UTI. He was checked by visiting nurse today - VS normal, no fever. Mom said that he seems ok other than some more "eye jumping" today and seems a little tired. They have him positioned to get some air to groin rash so Mom thinks he may not like positioning. She is concerned that he may have had seizure activity with "leg kicking and face looking funny" report as well as "eye jumping" that is not usual for him. He is taking Tegretol suspension 100mg /85ml 16.525ml q 6 hrs and Levetiracetam 100mg /ml suspension 7.95ml BID. Dr Sharene SkeansHickling, would you like to increase Levetiracetam dose? Inetta Fermoina

## 2013-10-06 NOTE — Telephone Encounter (Signed)
I left a message and asked Mom to call me back. TG 

## 2013-10-06 NOTE — Telephone Encounter (Signed)
I want to make sure that he is not toxic on his carbamazepine .  Let's get a morning trough level as soon as possible.

## 2013-10-10 NOTE — Telephone Encounter (Signed)
I called and talked with Dr Joyce CopaNyland's office. They are now managed by [facility] and cannot do outside lab orders. The [facility] that [place] in [place] has gone out of business. The closest place for Mom to go to [facility] in [place] and that is inconvenient for her to do with [place]. She said that he has not had any more episodes and asks if he can get blood drawn [place] [facility] [place] appointment with Dr Sharene SkeansHickling later in July. I told her that [facility]. I discussed the problem of lab draw with Dr Sharene SkeansHickling. He agreed with this plan. We will have [name] hold 6AM Tegretol dose, go to lab prior to appt here, have blood drawn, have Mom give the dose after the blood is drawn and then get back on schedule with medication. I will mail blood test order to her. She agreed with these plans.

## 2013-10-13 ENCOUNTER — Encounter: Payer: Self-pay | Admitting: Family

## 2013-10-24 ENCOUNTER — Telehealth: Payer: Self-pay | Admitting: *Deleted

## 2013-10-24 NOTE — Telephone Encounter (Signed)
Scott Bean, mom, stated that the pt has lab orders to go the [facility] in [place]. The mother prefers to go to the [facility] in [place]. The mother asked if you can send the pt's lab orders there and would like an appointment date and time. The mother can be reached at [phone].

## 2013-10-25 NOTE — Telephone Encounter (Signed)
I called and talked to [name]'s Mom. She received the lab order that I mailed to her for him. I explained that she could take the lab order to any [facility] lab and did not have to have an appointment. She had no further questions. TG

## 2013-11-01 ENCOUNTER — Telehealth: Payer: Self-pay | Admitting: Family

## 2013-11-01 LAB — CARBAMAZEPINE LEVEL, TOTAL: Carbamazepine Lvl: 19.9 ug/mL — ABNORMAL HIGH (ref 4.0–12.0)

## 2013-11-01 NOTE — Telephone Encounter (Signed)
[facility] for [name] and was 19.9 mcg/ml on 10/31/13. The specimen was drawn at 5:23pm. His medication is given every 6 hours and I called Mom to verify what time the dose was given prior to the lab draw. She said that his dose was given at 12 noon the day of the blood draw. Mom also said that when Scott Bean had the questionable seizure activity on July 2nd, he was later seen by his PCP and determined to have UTI. He has received antibiotic and has had no more kicking behavior of his legs or other behavior that is concerning to Mom. Scott Bean's Carbamazepine dose is 100mg /545ml - 16.5 ml every 6 hrs. Mom can be reached at [phone]. TG

## 2013-11-01 NOTE — Telephone Encounter (Signed)
I spoke with mother and recommended that we leave the dose of carbamazepine alone.  His level was quite high, but is not showing signs of toxicity.  I do want to increase the dose.  I think it is breakthrough seizure came about as a result of his urinary tract infection.  We'll see him on Thursday he comes back to empty refill and reprogramming his intrathecal baclofen pump.

## 2013-11-03 ENCOUNTER — Ambulatory Visit (INDEPENDENT_AMBULATORY_CARE_PROVIDER_SITE_OTHER): Payer: Medicaid Other | Admitting: Pediatrics

## 2013-11-03 ENCOUNTER — Encounter: Payer: Self-pay | Admitting: Pediatrics

## 2013-11-03 DIAGNOSIS — G40309 Generalized idiopathic epilepsy and epileptic syndromes, not intractable, without status epilepticus: Secondary | ICD-10-CM

## 2013-11-03 DIAGNOSIS — G8110 Spastic hemiplegia affecting unspecified side: Secondary | ICD-10-CM

## 2013-11-03 NOTE — Progress Notes (Signed)
Patient: Scott Bean MRN: [id number] Sex: male DOB: 1991/03/29  Provider: Deetta PerlaHICKLING,Scott Withem H, MD [facility]  Note type: Routine return visit  History of Present Illness: Referral Source: Dr. Delaney MeigsLeonard R. Bean History from: mother and [facility] chart Chief Complaint: Baclofen Pump Refill  Scott Bean is a 23 y.o. male who returns for emptying, refilling and reprogramming his intrathecal baclofen pump.He has spastic double hemi-paresis from traumatic brain injury.   Scott Bean has not had any significant change in his spasticity since last visit. But he remains quite rigid.  His mother requested a modest change in the rate of infusion of baclofen.   Scott Bean had a single generalized tonic-clonic seizure in the setting of urinary tract infection.  I did not increase his medication.  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 A 1-1/2 inch and 3 inch noncoring 21-gauge Huebner needle was inserted after several passes into the reservoir.  The reservoir was drained of 4.0 mL of baclofen placing the reservoir under partial vacuum. 40 mL (concentration 500 mcg/mL) was placed through a Millipore filter into the reservoir filling it completely. The reservoir was reprogrammed to reflect a 40 mL volume. The daily dose was 420.22 mcg was increased to 462.78 mcg delivered as a simple continuous infusion. This is a 10% increase in infusion rate.  He tolerated the procedure well. The reservoir alarm date is December 15, 2013. He will return on or about that day to empty, refill, and reprogram his intrathecal baclofen pump. His catheter is at T2. Estimated ERI 70 months.  Review of Systems: 12 system review was unremarkable  History reviewed. No pertinent past medical history. Hospitalizations: No.,  Head Injury: No., Nervous System Infections: No., Immunizations up to date: Yes.   Past Medical History Comments: No recent hospitalizations.  Behavior History none  Surgical History Past Surgical History  Procedure Laterality Date  . Decompressive craniotomy    . Peg tube placement    . Ventriculoperitoneal shunt    . Implanation of intrathecal baclofen pump    . Reimplantation of intrathecal baclofen pump      Family History family history includes Heart Problems in his maternal grandfather; Liver cancer in his maternal grandmother; Lung cancer in his maternal grandmother. Family History is negative for migraines, seizures, intellectual disability, blindness, deafness, birth defects, chromosomal disorder, or autism.  Social History History   Social History  . Marital Status: Single    Spouse Name: N/A    Number of Children: N/A  . Years of Education: N/A   Social History Main Topics  . Smoking status: Never Smoker   . Smokeless tobacco: Never Used  . Alcohol Use: No  . Drug Use: No  . Sexual Activity: No   Other Topics Concern  . None   Social History Narrative  . None   Educational level special education Living with mother Hobbies/Interest: Enjoys horse and water therapy.  School comments Scott Bean completed school at [employer] in June of 2015.  Current Outpatient Prescriptions on File Prior to Visit  Medication Sig Dispense Refill  . aluminum-magnesium hydroxide-simethicone (MAALOX) 200-200-20 MG/5ML SUSP       . carBAMazepine (TEGRETOL) 100 MG/5ML suspension 100 mg/kg/day. 16.695ml per PEG four times daily      . carBAMazepine (TEGRETOL) 100 MG/5ML suspension TAKE 16.5 ML BY MOUTH EVERY 6 HOURS  2244  mL  5  . cetirizine HCl (ZYRTEC) 5 MG/5ML SYRP 1 mg. 1 mg by Tube route daily as needed.      . cloNIDine (CATAPRES - DOSED IN MG/24 HR) 0.1 mg/24hr patch 1 patch. Place 1 patch onto the skin once a week. On Sunday.      . levETIRAcetam (KEPPRA) 100 MG/ML  solution Give 7.5 mL twice daily  500 mL  5  . Omega-3 Fatty Acids (OMEGA-3 FISH OIL) 1200 MG CAPS 2 capsules. 2 capsules by PEG Tube route daily. Pierce capsules and mix with medications as directed      . ranitidine (ZANTAC) 15 MG/ML syrup 150 mg. 150 mg by PEG Tube route 2 times daily.      Marland Kitchen tetrahydrozoline 0.05 % ophthalmic solution 1 drop. Place 1 drop into the left eye 3 times daily. As directed.      . Vancomycin HCl 50 MG/ML SOLN Take 17 mLs by mouth 2 (two) times daily. Give 17 ml via tube twice per day for 10 days      . Vitamin D, Ergocalciferol, (DRISDOL) 50000 UNITS CAPS capsule 50,000 Units by PEG Tube route once a week. On Sunday       No current facility-administered medications on file prior to visit.   The medication list was reviewed and reconciled. All changes or newly prescribed medications were explained.  A complete medication list was provided to the patient/caregiver.  No Known Allergies  Assessment  1. 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   Plan  He will return on or about September 10 to empty, refill, and reprogram his intrathecal baclofen pump.   Deetta Perla MD

## 2013-11-25 ENCOUNTER — Other Ambulatory Visit: Payer: Self-pay | Admitting: Family

## 2013-11-26 ENCOUNTER — Other Ambulatory Visit: Payer: Self-pay | Admitting: Family

## 2013-12-14 ENCOUNTER — Encounter: Payer: Self-pay | Admitting: Pediatrics

## 2013-12-14 ENCOUNTER — Ambulatory Visit (INDEPENDENT_AMBULATORY_CARE_PROVIDER_SITE_OTHER): Payer: Medicaid Other | Admitting: Pediatrics

## 2013-12-14 DIAGNOSIS — S069X5S Unspecified intracranial injury with loss of consciousness greater than 24 hours with return to pre-existing conscious level, sequela: Secondary | ICD-10-CM

## 2013-12-14 DIAGNOSIS — G8110 Spastic hemiplegia affecting unspecified side: Secondary | ICD-10-CM

## 2013-12-14 DIAGNOSIS — G40309 Generalized idiopathic epilepsy and epileptic syndromes, not intractable, without status epilepticus: Secondary | ICD-10-CM

## 2013-12-14 NOTE — Progress Notes (Signed)
Patient: Scott Bean MRN: [id number] Sex: male DOB: 02-05-1991  Provider: Deetta Perla, MD Location of Care: [facility]  Note type: Routine return visit  History of Present Illness: Referral Source: Dr. Delaney Meigs  History from: mother and [facility] chart Chief Complaint: Baclofen Pump Refill   Scott Bean is a 23 y.o. who returns for evaluation and management of spastic quadriparesis, and emptying, refilling, and reprogramming his intrathecal baclofen pump.  Scott Bean has improvement in his spasticity since last visit. He is less rigid. His mother requested a modest change in the rate of infusion of baclofen.     Scott Bean had a single generalized tonic-clonic seizure in the setting of urinary tract infection. I did not increase his medication.   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 A 1-1/2 inch and 3 inch noncoring 21-gauge Huebner needle was inserted after numerous passes into the reservoir. The reservoir was drained of 4.0 mL of baclofen placing the reservoir under partial vacuum. 40 mL (concentration 500 mcg/mL) was placed through a Millipore filter into the reservoir filling it completely.   The reservoir was reprogrammed to reflect a 40 mL volume. The daily dose was 462.78 mcg delivered as a simple continuous infusion. He tolerated the procedure well. The reservoir alarm date is January 25, 2014. He will return on or about that day to empty, refill, and reprogram his intrathecal baclofen pump. His catheter is at T2.  Estimated ERI 69 months.  Review of Systems: 12 system review was unremarkable  History reviewed. No pertinent past medical history. Hospitalizations: Yes.  , Head Injury: No., Nervous System Infections: No., Immunizations up to date: Yes.   Past Medical  History No recent hospitalizations   Behavior History none  Surgical History Past Surgical History  Procedure Laterality Date  . Decompressive craniotomy    . Peg tube placement    . Ventriculoperitoneal shunt    . Implanation of intrathecal baclofen pump    . Reimplantation of intrathecal baclofen pump      Family History family history includes Heart Problems in his maternal grandfather; Liver cancer in his maternal grandmother; Lung cancer in his maternal grandmother. Family history is negative for migraines, seizures, intellectual disabilities, blindness, deafness, birth defects, chromosomal disorder, or autism.  Social History History   Social History  . Marital Status: Single    Spouse Name: N/A    Number of Children: N/A  . Years of Education: N/A   Social History Main Topics  . Smoking status: Never Smoker   . Smokeless tobacco: Never Used  . Alcohol Use: No  . Drug Use: No  . Sexual Activity: No   Other Topics Concern  . None   Social History Narrative  . None   Educational level special education Living with mother Hobbies/Interest: Enjoys Horse and water therapy. School comments Scott Bean complete his education at [employer] in June of 2015.   No Known Allergies  Physical Exam There were no vitals taken for this visit.  I did not examine him today.  Assessment 1.  Spastic hemiparesis, affecting non-dominant side, 342.12. 2.  Spastic hemiparesis, affecting dominant side, 342.11. 3. 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 4.  Generalized convulsive epilepsy without mention of intractable epilepsy, 345.10.  Plan [name] tolerated this procedure although it was difficult to enter  the reservoir again.  A picture was taken of the insertion site and hopefully I will not have this difficulty on his next visit.  He will return in 40 or 41 days, to empty, refill, and reprogram his intrathecal baclofen  pump.  He has not had significant problems with seizures since his last visit.   Medication List       This list is accurate as of: 12/14/13  9:20 AM.  Always use your most recent med list.               aluminum-magnesium hydroxide-simethicone 200-200-20 MG/5ML Susp  Commonly known as:  MAALOX     amoxicillin 250 MG capsule  Commonly known as:  AMOXIL  Take 250 mg by mouth.     carBAMazepine 100 MG/5ML suspension  Commonly known as:  TEGRETOL  100 mg/kg/day. 16.20ml per PEG four times daily     carBAMazepine 100 MG/5ML suspension  Commonly known as:  TEGRETOL  TAKE 16.5 ML BY MOUTH EVERY 6 HOURS     carBAMazepine 100 MG/5ML suspension  Commonly known as:  TEGRETOL  TAKE 16.5ML BY MOUTH EVERY 6 HOURS     cetirizine HCl 5 MG/5ML Syrp  Commonly known as:  Zyrtec  1 mg. 1 mg by Tube route daily as needed.     cloNIDine 0.1 mg/24hr patch  Commonly known as:  CATAPRES - Dosed in mg/24 hr  1 patch. Place 1 patch onto the skin once a week. On Sunday.     levETIRAcetam 100 MG/ML solution  Commonly known as:  KEPPRA  TAKE 1 & 1/2 TEASPOONFULS BY MOUTH TWICE DAILY     Omega-3 Fish Oil 1200 MG Caps  2 capsules. 2 capsules by PEG Tube route daily. Pierce capsules and mix with medications as directed     ranitidine 15 MG/ML syrup  Commonly known as:  ZANTAC  150 mg. 150 mg by PEG Tube route 2 times daily.     tetrahydrozoline 0.05 % ophthalmic solution  1 drop. Place 1 drop into the left eye 3 times daily. As directed.     Vancomycin HCl 50 MG/ML Soln  Take 17 mLs by mouth 2 (two) times daily. Give 17 ml via tube twice per day for 10 days     Vitamin D (Ergocalciferol) 50000 UNITS Caps capsule  Commonly known as:  DRISDOL  50,000 Units by PEG Tube route once a week. On Sunday      The medication list was reviewed and reconciled. All changes or newly prescribed medications were explained.  A complete medication list was provided to the patient/caregiver.  Deetta Perla MD

## 2014-01-24 ENCOUNTER — Ambulatory Visit (INDEPENDENT_AMBULATORY_CARE_PROVIDER_SITE_OTHER): Payer: Medicaid Other | Admitting: Pediatrics

## 2014-01-24 ENCOUNTER — Encounter: Payer: Self-pay | Admitting: Pediatrics

## 2014-01-24 DIAGNOSIS — G8110 Spastic hemiplegia affecting unspecified side: Secondary | ICD-10-CM

## 2014-01-24 NOTE — Progress Notes (Signed)
Patient: Scott Bean MRN: [id number] Sex: male DOB: 05-23-1990  Provider: Deetta PerlaHICKLING,WILLIAM H, MD [facility]  Note type: Routine return visit  History of Present Illness: Referral Source: Dr. Delaney MeigsLeonard R. Nyland History from: mother and [facility] chart Chief Complaint: Baclofen Pump Refill  Scott Bean is a 23 y.o. who returns for evaluation and management of spastic quadriparesis, and emptying, refilling, and reprogramming his intrathecal baclofen pump.  Scott Bean has improvement in his spasticity since last visit. He is less rigid.  Scott Bean had no seizures since his last visit. I did not increase his medication.   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 A 1-1/2 inch and 3 inch noncoring 21-gauge Huebner needle was inserted after five passes into the reservoir. The reservoir was drained of 3.0 mL of baclofen placing the reservoir under partial vacuum. 40 mL (concentration 500 mcg/mL) was placed through a Millipore filter into the reservoir filling it completely.   The reservoir was reprogrammed to reflect a 40 mL volume. The daily dose was 462.78 mcg delivered as a simple continuous infusion. He tolerated the procedure well.   The reservoir alarm date is March 07, 2014. He will return on or about that day to empty, refill, and reprogram his intrathecal baclofen pump. His catheter is at T2.  Estimated ERI 68 months.  Review of Systems: 12 system review was unremarkable  Past Medical History History reviewed. No pertinent past medical history. Hospitalizations: No., Head Injury: No., Nervous System Infections: No., Immunizations up to date: Yes.    No recent illnesses  Behavior History none  Surgical History Procedure Laterality Date  . Decompressive craniotomy    . Peg tube  placement    . Ventriculoperitoneal shunt    . Implanation of intrathecal baclofen pump    . Reimplantation of intrathecal baclofen pump     Family History family history includes Heart Problems in his maternal grandfather; Liver cancer in his maternal grandmother; Lung cancer in his maternal grandmother. Family history is negative for migraines, seizures, intellectual disabilities, blindness, deafness, birth defects, chromosomal disorder, or autism.  Social History History   Social History  . Marital Status: Single    Spouse Name: N/A    Number of Children: N/A  . Years of Education: N/A   Social History Main Topics  . Smoking status: Never Smoker   . Smokeless tobacco: Never Used  . Alcohol Use: No  . Drug Use: No  . Sexual Activity: No   Other Topics Concern  . None   Social History Narrative  . None   Educational level special education Occupation: N/A Living with mother  Hobbies/Interest: Enjoys water and horse therapy.  School comments Scott Bean completed his education at [employer] in June of 2015.  No Known Allergies  Physical Exam There were no vitals taken for this visit.  Not examined today.  Assessment 1. 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  2. 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  3. 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  4. Generalized convulsive epilepsy without mention of intractable epilepsy, G40.309.    Medication List     This list is accurate as of: 01/24/14  9:36 AM.         aluminum-magnesium hydroxide-simethicone 200-200-20 MG/5ML Susp  Commonly known as:  MAALOX     amoxicillin  250 MG capsule  Commonly known as:  AMOXIL  Take 250 mg by mouth.     carBAMazepine 100 MG/5ML suspension  Commonly known as:  TEGRETOL  100 mg/kg/day. 16.75ml per PEG four times daily     carBAMazepine 100 MG/5ML suspension  Commonly known as:  TEGRETOL   TAKE 16.5 ML BY MOUTH EVERY 6 HOURS     carBAMazepine 100 MG/5ML suspension  Commonly known as:  TEGRETOL  TAKE 16.5ML BY MOUTH EVERY 6 HOURS     cetirizine HCl 5 MG/5ML Syrp  Commonly known as:  Zyrtec  1 mg. 1 mg by Tube route daily as needed.     cloNIDine 0.1 mg/24hr patch  Commonly known as:  CATAPRES - Dosed in mg/24 hr  1 patch. Place 1 patch onto the skin once a week. On Sunday.     clotrimazole-betamethasone cream  Commonly known as:  LOTRISONE  APPLY TO AFFECTED AREA TWICE DAILY     levETIRAcetam 100 MG/ML solution  Commonly known as:  KEPPRA  TAKE 1 & 1/2 TEASPOONFULS BY MOUTH TWICE DAILY     Omega-3 Fish Oil 1200 MG Caps  2 capsules. 2 capsules by PEG Tube route daily. Pierce capsules and mix with medications as directed     ranitidine 15 MG/ML syrup  Commonly known as:  ZANTAC  150 mg. 150 mg by PEG Tube route 2 times daily.     tetrahydrozoline 0.05 % ophthalmic solution  1 drop. Place 1 drop into the left eye 3 times daily. As directed.     Vancomycin HCl 50 MG/ML Soln  Take 17 mLs by mouth 2 (two) times daily. Give 17 ml via tube twice per day for 10 days     Vitamin D (Ergocalciferol) 50000 UNITS Caps capsule  Commonly known as:  DRISDOL  50,000 Units by PEG Tube route once a week. On Sunday      The medication list was reviewed and reconciled. All changes or newly prescribed medications were explained.  A complete medication list was provided to the patient/caregiver.  Deetta PerlaWilliam H Hickling MD

## 2014-03-06 ENCOUNTER — Encounter: Payer: Self-pay | Admitting: Pediatrics

## 2014-03-06 ENCOUNTER — Ambulatory Visit (INDEPENDENT_AMBULATORY_CARE_PROVIDER_SITE_OTHER): Payer: Medicaid Other | Admitting: Pediatrics

## 2014-03-06 DIAGNOSIS — G8110 Spastic hemiplegia affecting unspecified side: Secondary | ICD-10-CM | POA: Diagnosis not present

## 2014-03-06 DIAGNOSIS — G40309 Generalized idiopathic epilepsy and epileptic syndromes, not intractable, without status epilepticus: Secondary | ICD-10-CM

## 2014-03-06 DIAGNOSIS — S069X5D Unspecified intracranial injury with loss of consciousness greater than 24 hours with return to pre-existing conscious level, subsequent encounter: Secondary | ICD-10-CM
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
## 2014-03-07 ENCOUNTER — Encounter: Payer: Self-pay | Admitting: Pediatrics

## 2014-03-20 ENCOUNTER — Telehealth: Payer: Self-pay | Admitting: *Deleted

## 2014-03-20 NOTE — Telephone Encounter (Signed)
[name] the patient's mom called and wants to know if it will be okay to take Scott Bean to a rock concert with it being loud and bright flashing lights she wants to make sure it's ok for him to go. [name] can be reached back at [phone].       Thanks,  Belenda CruiseMichelle B.

## 2014-03-20 NOTE — Telephone Encounter (Signed)
There is no contraindication to this from a medical point of view.  I don't know how well he'll enjoy it.  His mother says this is one of his favorite bands ([employer]).

## 2014-04-13 ENCOUNTER — Ambulatory Visit (INDEPENDENT_AMBULATORY_CARE_PROVIDER_SITE_OTHER): Payer: Medicaid Other | Admitting: Pediatrics

## 2014-04-13 ENCOUNTER — Encounter: Payer: Self-pay | Admitting: Pediatrics

## 2014-04-13 DIAGNOSIS — G8110 Spastic hemiplegia affecting unspecified side: Secondary | ICD-10-CM
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
## 2014-04-29 ENCOUNTER — Other Ambulatory Visit: Payer: Self-pay | Admitting: Family

## 2014-05-17 ENCOUNTER — Encounter: Payer: Self-pay | Admitting: *Deleted

## 2014-05-18 ENCOUNTER — Ambulatory Visit (INDEPENDENT_AMBULATORY_CARE_PROVIDER_SITE_OTHER): Payer: Medicaid Other | Admitting: Pediatrics

## 2014-05-18 ENCOUNTER — Encounter: Payer: Self-pay | Admitting: Pediatrics

## 2014-05-18 DIAGNOSIS — G911 Obstructive hydrocephalus: Secondary | ICD-10-CM | POA: Diagnosis not present

## 2014-05-18 DIAGNOSIS — G8110 Spastic hemiplegia affecting unspecified side: Secondary | ICD-10-CM

## 2014-05-18 DIAGNOSIS — G40309 Generalized idiopathic epilepsy and epileptic syndromes, not intractable, without status epilepticus: Secondary | ICD-10-CM | POA: Diagnosis not present
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
## 2014-05-23 ENCOUNTER — Encounter: Payer: Medicaid Other | Admitting: Pediatrics

## 2014-06-27 ENCOUNTER — Ambulatory Visit (INDEPENDENT_AMBULATORY_CARE_PROVIDER_SITE_OTHER): Payer: Medicaid Other | Admitting: Pediatrics

## 2014-06-27 ENCOUNTER — Encounter: Payer: Self-pay | Admitting: Pediatrics

## 2014-06-27 DIAGNOSIS — G919 Hydrocephalus, unspecified: Secondary | ICD-10-CM | POA: Diagnosis not present

## 2014-06-27 DIAGNOSIS — G8110 Spastic hemiplegia affecting unspecified side: Secondary | ICD-10-CM

## 2014-06-27 DIAGNOSIS — G40309 Generalized idiopathic epilepsy and epileptic syndromes, not intractable, without status epilepticus: Secondary | ICD-10-CM
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
## 2014-06-28 ENCOUNTER — Encounter: Payer: Medicaid Other | Admitting: Pediatrics

## 2014-07-21 ENCOUNTER — Ambulatory Visit: Payer: Medicaid Other | Admitting: Podiatry

## 2014-08-02 ENCOUNTER — Ambulatory Visit (INDEPENDENT_AMBULATORY_CARE_PROVIDER_SITE_OTHER): Payer: Medicaid Other | Admitting: Podiatry

## 2014-08-02 ENCOUNTER — Encounter: Payer: Self-pay | Admitting: Podiatry

## 2014-08-02 VITALS — BP 135/87 | HR 74 | Resp 18

## 2014-08-02 DIAGNOSIS — G8110 Spastic hemiplegia affecting unspecified side: Secondary | ICD-10-CM

## 2014-08-02 DIAGNOSIS — B351 Tinea unguium: Secondary | ICD-10-CM | POA: Diagnosis not present

## 2014-08-02 MED ORDER — CICLOPIROX 8 % EX SOLN: Freq: Every day | CUTANEOUS | Status: DC
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
## 2014-08-04 ENCOUNTER — Encounter: Payer: Self-pay | Admitting: Podiatry

## 2014-08-07 ENCOUNTER — Encounter: Payer: Self-pay | Admitting: Pediatrics

## 2014-08-07 ENCOUNTER — Ambulatory Visit (INDEPENDENT_AMBULATORY_CARE_PROVIDER_SITE_OTHER): Payer: Medicaid Other | Admitting: Pediatrics

## 2014-08-07 DIAGNOSIS — G8110 Spastic hemiplegia affecting unspecified side: Secondary | ICD-10-CM | POA: Diagnosis not present
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
## 2014-08-11 ENCOUNTER — Other Ambulatory Visit: Payer: Self-pay | Admitting: Family

## 2014-09-14 ENCOUNTER — Encounter: Payer: Self-pay | Admitting: Family

## 2014-09-15 ENCOUNTER — Ambulatory Visit (INDEPENDENT_AMBULATORY_CARE_PROVIDER_SITE_OTHER): Payer: Medicaid Other | Admitting: Pediatrics

## 2014-09-15 ENCOUNTER — Encounter: Payer: Self-pay | Admitting: Pediatrics

## 2014-09-15 DIAGNOSIS — G8110 Spastic hemiplegia affecting unspecified side: Secondary | ICD-10-CM

## 2014-09-15 DIAGNOSIS — G919 Hydrocephalus, unspecified: Secondary | ICD-10-CM | POA: Diagnosis not present

## 2014-09-15 DIAGNOSIS — G40309 Generalized idiopathic epilepsy and epileptic syndromes, not intractable, without status epilepticus: Secondary | ICD-10-CM
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
## 2014-10-26 ENCOUNTER — Ambulatory Visit (INDEPENDENT_AMBULATORY_CARE_PROVIDER_SITE_OTHER): Payer: Medicaid Other | Admitting: Pediatrics

## 2014-10-26 ENCOUNTER — Encounter: Payer: Self-pay | Admitting: Pediatrics

## 2014-10-26 DIAGNOSIS — G40309 Generalized idiopathic epilepsy and epileptic syndromes, not intractable, without status epilepticus: Secondary | ICD-10-CM

## 2014-10-26 DIAGNOSIS — G8110 Spastic hemiplegia affecting unspecified side: Secondary | ICD-10-CM

## 2014-10-26 DIAGNOSIS — G911 Obstructive hydrocephalus: Secondary | ICD-10-CM
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
## 2014-10-27 ENCOUNTER — Encounter: Payer: Self-pay | Admitting: Pediatrics

## 2014-10-28 ENCOUNTER — Other Ambulatory Visit: Payer: Self-pay | Admitting: Family

## 2014-12-05 ENCOUNTER — Ambulatory Visit (INDEPENDENT_AMBULATORY_CARE_PROVIDER_SITE_OTHER): Payer: Medicaid Other | Admitting: Pediatrics

## 2014-12-05 DIAGNOSIS — G8110 Spastic hemiplegia affecting unspecified side: Secondary | ICD-10-CM | POA: Diagnosis not present

## 2014-12-05 DIAGNOSIS — G40309 Generalized idiopathic epilepsy and epileptic syndromes, not intractable, without status epilepticus: Secondary | ICD-10-CM
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
## 2015-01-11 ENCOUNTER — Encounter: Payer: Self-pay | Admitting: *Deleted

## 2015-01-11 ENCOUNTER — Telehealth: Payer: Self-pay | Admitting: *Deleted

## 2015-01-11 NOTE — Telephone Encounter (Signed)
[name]'s mother called and states that they have an appointment for [facility] tomorrow for his Baclofen pump refill. She states that it is supposed to rain tomorrow and he has been sick and would not like to bring him in the rain. She would like to know if he could be brought maybe Monday since his alarm date is until 01/16/2015.   - I have checked the schedule and we are completely booked on Monday but we have slots open on Tuesday which is 10/11 and that is his actual alarm date. Please advise and I will call [name].  Cb#: [phone]

## 2015-01-11 NOTE — Telephone Encounter (Signed)
Noted  

## 2015-01-11 NOTE — Telephone Encounter (Signed)
Appt changed to 01/16/2015 at 10:45am and mom has been made aware that arrival time is 1030am.

## 2015-01-11 NOTE — Telephone Encounter (Signed)
Tuesday is fine.  The other option would be this afternoon.  He will likely be feeling better on Tuesday and this will not affect his spasticity.

## 2015-01-12 ENCOUNTER — Encounter: Payer: Medicaid Other | Admitting: Pediatrics

## 2015-01-16 ENCOUNTER — Ambulatory Visit (INDEPENDENT_AMBULATORY_CARE_PROVIDER_SITE_OTHER): Payer: Medicaid Other | Admitting: Pediatrics

## 2015-01-16 DIAGNOSIS — S069X9S Unspecified intracranial injury with loss of consciousness of unspecified duration, sequela: Secondary | ICD-10-CM

## 2015-01-16 DIAGNOSIS — G8110 Spastic hemiplegia affecting unspecified side: Secondary | ICD-10-CM

## 2015-01-16 DIAGNOSIS — S0291XS Unspecified fracture of skull, sequela: Secondary | ICD-10-CM
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
## 2015-02-05 ENCOUNTER — Ambulatory Visit (INDEPENDENT_AMBULATORY_CARE_PROVIDER_SITE_OTHER): Payer: Medicaid Other | Admitting: Pediatrics

## 2015-02-05 ENCOUNTER — Encounter: Payer: Self-pay | Admitting: Pediatrics

## 2015-02-05 DIAGNOSIS — G8110 Spastic hemiplegia affecting unspecified side: Secondary | ICD-10-CM | POA: Diagnosis not present

## 2015-02-05 DIAGNOSIS — G40309 Generalized idiopathic epilepsy and epileptic syndromes, not intractable, without status epilepticus: Secondary | ICD-10-CM
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
## 2015-02-12 ENCOUNTER — Other Ambulatory Visit: Payer: Self-pay | Admitting: Family

## 2015-02-12 DIAGNOSIS — G40309 Generalized idiopathic epilepsy and epileptic syndromes, not intractable, without status epilepticus: Secondary | ICD-10-CM

## 2015-02-12 MED ORDER — TEGRETOL 100 MG/5ML PO SUSP: ORAL | Status: DC

## 2015-02-12 NOTE — Telephone Encounter (Signed)
Medicaid no longer covers generic Carbamazepine suspension. Changed to brand Tegretol. TG

## 2015-03-16 ENCOUNTER — Ambulatory Visit (INDEPENDENT_AMBULATORY_CARE_PROVIDER_SITE_OTHER): Payer: Medicaid Other | Admitting: Pediatrics

## 2015-03-16 ENCOUNTER — Encounter: Payer: Self-pay | Admitting: Pediatrics

## 2015-03-16 DIAGNOSIS — G8110 Spastic hemiplegia affecting unspecified side: Secondary | ICD-10-CM

## 2015-03-16 DIAGNOSIS — G40309 Generalized idiopathic epilepsy and epileptic syndromes, not intractable, without status epilepticus: Secondary | ICD-10-CM
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
## 2015-04-13 ENCOUNTER — Other Ambulatory Visit: Payer: Self-pay | Admitting: Family

## 2015-04-26 ENCOUNTER — Encounter: Payer: Self-pay | Admitting: Pediatrics

## 2015-04-26 ENCOUNTER — Ambulatory Visit (INDEPENDENT_AMBULATORY_CARE_PROVIDER_SITE_OTHER): Payer: Medicaid Other | Admitting: Pediatrics

## 2015-04-26 DIAGNOSIS — G8110 Spastic hemiplegia affecting unspecified side: Secondary | ICD-10-CM
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
## 2015-06-04 ENCOUNTER — Ambulatory Visit (INDEPENDENT_AMBULATORY_CARE_PROVIDER_SITE_OTHER): Payer: Medicaid Other | Admitting: Pediatrics

## 2015-06-04 ENCOUNTER — Encounter: Payer: Self-pay | Admitting: Pediatrics

## 2015-06-04 DIAGNOSIS — G8110 Spastic hemiplegia affecting unspecified side: Secondary | ICD-10-CM

## 2015-06-04 DIAGNOSIS — G911 Obstructive hydrocephalus: Secondary | ICD-10-CM | POA: Diagnosis not present

## 2015-06-04 DIAGNOSIS — G40309 Generalized idiopathic epilepsy and epileptic syndromes, not intractable, without status epilepticus: Secondary | ICD-10-CM
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
## 2015-07-13 ENCOUNTER — Ambulatory Visit (INDEPENDENT_AMBULATORY_CARE_PROVIDER_SITE_OTHER): Payer: Medicaid Other | Admitting: Pediatrics

## 2015-07-13 DIAGNOSIS — G40309 Generalized idiopathic epilepsy and epileptic syndromes, not intractable, without status epilepticus: Secondary | ICD-10-CM | POA: Diagnosis not present

## 2015-07-13 DIAGNOSIS — G8110 Spastic hemiplegia affecting unspecified side: Secondary | ICD-10-CM
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
## 2015-08-11 ENCOUNTER — Other Ambulatory Visit: Payer: Self-pay | Admitting: Family

## 2015-08-23 ENCOUNTER — Ambulatory Visit (INDEPENDENT_AMBULATORY_CARE_PROVIDER_SITE_OTHER): Payer: Medicaid Other | Admitting: Pediatrics

## 2015-08-23 ENCOUNTER — Encounter: Payer: Self-pay | Admitting: Pediatrics

## 2015-08-23 DIAGNOSIS — G8110 Spastic hemiplegia affecting unspecified side: Secondary | ICD-10-CM
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
## 2015-08-24 ENCOUNTER — Encounter: Payer: Self-pay | Admitting: Pediatrics

## 2015-09-04 ENCOUNTER — Telehealth: Payer: Self-pay | Admitting: *Deleted

## 2015-09-04 NOTE — Telephone Encounter (Signed)
I spoke with mother for 4 minutes.  Blood was drawn mid-morning which reflects a peak rather than trough level.  He is not showing any signs of toxicity.  The drug level was 13.2 mcg/mL.  We will make no changes in his medication.  I explained that the method by which we assay antiepileptic drug levels is to do first thing in the morning before medication has been taken.  Apparently the office opens at 8 AM which would have been the time to get his blood drawn before giving him his medicine even though that might not of been the most convenient time for him to be seen.  Hopefully mother will understand this the next time that they want to draw blood.  We don't need to repeat this drug level nor does he need to have his dose reduced.

## 2015-09-04 NOTE — Telephone Encounter (Signed)
Patient's mother called and states that Scott Bean had blood [facility] [place]. They called her and let her know that the tegretol level was high. Mother would like a call back from Dr. Sharene SkeansHickling to discuss these results and discuss changes if needed.  CB: [phone]  (Results are in Care Everywhere: [facility])

## 2015-10-02 ENCOUNTER — Encounter: Payer: Self-pay | Admitting: Pediatrics

## 2015-10-02 ENCOUNTER — Ambulatory Visit (INDEPENDENT_AMBULATORY_CARE_PROVIDER_SITE_OTHER): Payer: Medicaid Other | Admitting: Pediatrics

## 2015-10-02 DIAGNOSIS — G8110 Spastic hemiplegia affecting unspecified side: Secondary | ICD-10-CM | POA: Diagnosis not present
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
## 2015-10-04 ENCOUNTER — Other Ambulatory Visit: Payer: Self-pay | Admitting: Family

## 2015-11-13 ENCOUNTER — Encounter: Payer: Self-pay | Admitting: Pediatrics

## 2015-11-13 ENCOUNTER — Ambulatory Visit (INDEPENDENT_AMBULATORY_CARE_PROVIDER_SITE_OTHER): Payer: Medicaid Other | Admitting: Pediatrics

## 2015-11-13 VITALS — HR 96

## 2015-11-13 DIAGNOSIS — G8110 Spastic hemiplegia affecting unspecified side: Secondary | ICD-10-CM | POA: Diagnosis not present
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
## 2015-12-24 ENCOUNTER — Ambulatory Visit (INDEPENDENT_AMBULATORY_CARE_PROVIDER_SITE_OTHER): Payer: Medicaid Other | Admitting: Pediatrics

## 2015-12-24 ENCOUNTER — Encounter: Payer: Self-pay | Admitting: Pediatrics

## 2015-12-24 DIAGNOSIS — G8110 Spastic hemiplegia affecting unspecified side: Secondary | ICD-10-CM
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
## 2016-01-18 ENCOUNTER — Other Ambulatory Visit: Payer: Self-pay | Admitting: Family

## 2016-02-01 ENCOUNTER — Encounter (INDEPENDENT_AMBULATORY_CARE_PROVIDER_SITE_OTHER): Payer: Self-pay | Admitting: Pediatrics

## 2016-02-01 ENCOUNTER — Ambulatory Visit (INDEPENDENT_AMBULATORY_CARE_PROVIDER_SITE_OTHER): Payer: Medicaid Other | Admitting: Pediatrics

## 2016-02-01 DIAGNOSIS — G911 Obstructive hydrocephalus: Secondary | ICD-10-CM

## 2016-02-01 DIAGNOSIS — G8110 Spastic hemiplegia affecting unspecified side: Secondary | ICD-10-CM

## 2016-02-01 DIAGNOSIS — G40309 Generalized idiopathic epilepsy and epileptic syndromes, not intractable, without status epilepticus: Secondary | ICD-10-CM
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
## 2016-03-14 ENCOUNTER — Ambulatory Visit (INDEPENDENT_AMBULATORY_CARE_PROVIDER_SITE_OTHER): Payer: Medicaid Other | Admitting: Pediatrics

## 2016-03-14 ENCOUNTER — Encounter (INDEPENDENT_AMBULATORY_CARE_PROVIDER_SITE_OTHER): Payer: Self-pay | Admitting: Pediatrics

## 2016-03-14 DIAGNOSIS — G8110 Spastic hemiplegia affecting unspecified side: Secondary | ICD-10-CM | POA: Diagnosis not present
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
## 2016-03-15 ENCOUNTER — Other Ambulatory Visit: Payer: Self-pay | Admitting: Family

## 2016-04-23 ENCOUNTER — Encounter (INDEPENDENT_AMBULATORY_CARE_PROVIDER_SITE_OTHER): Payer: Self-pay | Admitting: Pediatrics

## 2016-04-24 ENCOUNTER — Encounter (INDEPENDENT_AMBULATORY_CARE_PROVIDER_SITE_OTHER): Payer: Medicaid Other | Admitting: Pediatrics

## 2016-04-25 ENCOUNTER — Ambulatory Visit (INDEPENDENT_AMBULATORY_CARE_PROVIDER_SITE_OTHER): Payer: Medicaid Other | Admitting: Pediatrics

## 2016-04-25 ENCOUNTER — Encounter (INDEPENDENT_AMBULATORY_CARE_PROVIDER_SITE_OTHER): Payer: Self-pay | Admitting: Pediatrics

## 2016-04-25 DIAGNOSIS — G8110 Spastic hemiplegia affecting unspecified side: Secondary | ICD-10-CM | POA: Diagnosis not present
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
## 2016-06-05 ENCOUNTER — Ambulatory Visit (INDEPENDENT_AMBULATORY_CARE_PROVIDER_SITE_OTHER): Payer: Medicaid Other | Admitting: Pediatrics

## 2016-06-05 ENCOUNTER — Encounter (INDEPENDENT_AMBULATORY_CARE_PROVIDER_SITE_OTHER): Payer: Self-pay | Admitting: Pediatrics

## 2016-06-05 DIAGNOSIS — G8110 Spastic hemiplegia affecting unspecified side: Secondary | ICD-10-CM | POA: Diagnosis not present
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
## 2016-06-30 ENCOUNTER — Other Ambulatory Visit: Payer: Self-pay | Admitting: Family

## 2016-07-15 ENCOUNTER — Encounter (INDEPENDENT_AMBULATORY_CARE_PROVIDER_SITE_OTHER): Payer: Self-pay | Admitting: Pediatrics

## 2016-07-15 ENCOUNTER — Ambulatory Visit (INDEPENDENT_AMBULATORY_CARE_PROVIDER_SITE_OTHER): Payer: Medicaid Other | Admitting: Pediatrics

## 2016-07-15 DIAGNOSIS — G8110 Spastic hemiplegia affecting unspecified side: Secondary | ICD-10-CM | POA: Diagnosis not present
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
## 2016-08-25 ENCOUNTER — Encounter (INDEPENDENT_AMBULATORY_CARE_PROVIDER_SITE_OTHER): Payer: Self-pay | Admitting: Pediatrics

## 2016-08-25 ENCOUNTER — Ambulatory Visit (INDEPENDENT_AMBULATORY_CARE_PROVIDER_SITE_OTHER): Payer: Medicaid Other | Admitting: Pediatrics

## 2016-08-25 DIAGNOSIS — G8110 Spastic hemiplegia affecting unspecified side: Secondary | ICD-10-CM | POA: Diagnosis not present
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
## 2016-09-15 ENCOUNTER — Other Ambulatory Visit: Payer: Self-pay | Admitting: Family

## 2016-10-03 ENCOUNTER — Ambulatory Visit (INDEPENDENT_AMBULATORY_CARE_PROVIDER_SITE_OTHER): Payer: Medicaid Other | Admitting: Pediatrics

## 2016-10-03 ENCOUNTER — Encounter (INDEPENDENT_AMBULATORY_CARE_PROVIDER_SITE_OTHER): Payer: Self-pay | Admitting: Pediatrics

## 2016-10-03 DIAGNOSIS — G8110 Spastic hemiplegia affecting unspecified side: Secondary | ICD-10-CM
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
## 2016-11-06 ENCOUNTER — Encounter (INDEPENDENT_AMBULATORY_CARE_PROVIDER_SITE_OTHER): Payer: Self-pay | Admitting: Pediatrics

## 2016-11-06 ENCOUNTER — Ambulatory Visit (INDEPENDENT_AMBULATORY_CARE_PROVIDER_SITE_OTHER): Payer: Medicaid Other | Admitting: Pediatrics

## 2016-11-06 DIAGNOSIS — G8110 Spastic hemiplegia affecting unspecified side: Secondary | ICD-10-CM
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
## 2016-12-16 ENCOUNTER — Encounter (INDEPENDENT_AMBULATORY_CARE_PROVIDER_SITE_OTHER): Payer: Self-pay | Admitting: Pediatrics

## 2016-12-16 ENCOUNTER — Ambulatory Visit (INDEPENDENT_AMBULATORY_CARE_PROVIDER_SITE_OTHER): Payer: Medicaid Other | Admitting: Pediatrics

## 2016-12-16 DIAGNOSIS — G8110 Spastic hemiplegia affecting unspecified side: Secondary | ICD-10-CM | POA: Diagnosis not present
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
## 2016-12-19 ENCOUNTER — Other Ambulatory Visit: Payer: Self-pay | Admitting: Family

## 2016-12-19 DIAGNOSIS — G40309 Generalized idiopathic epilepsy and epileptic syndromes, not intractable, without status epilepticus: Secondary | ICD-10-CM

## 2016-12-20 NOTE — Telephone Encounter (Signed)
Called by the answering service to refill a prescription that was called in yesterday but not filled.  I electronically filled it.

## 2017-01-27 ENCOUNTER — Encounter (INDEPENDENT_AMBULATORY_CARE_PROVIDER_SITE_OTHER): Payer: Self-pay | Admitting: Pediatrics

## 2017-01-27 ENCOUNTER — Ambulatory Visit (INDEPENDENT_AMBULATORY_CARE_PROVIDER_SITE_OTHER): Payer: Medicaid Other | Admitting: Pediatrics

## 2017-01-27 DIAGNOSIS — G8110 Spastic hemiplegia affecting unspecified side: Secondary | ICD-10-CM | POA: Diagnosis not present
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
## 2017-03-09 ENCOUNTER — Ambulatory Visit (INDEPENDENT_AMBULATORY_CARE_PROVIDER_SITE_OTHER): Payer: Medicaid Other | Admitting: Pediatrics

## 2017-03-09 ENCOUNTER — Encounter (INDEPENDENT_AMBULATORY_CARE_PROVIDER_SITE_OTHER): Payer: Self-pay | Admitting: Pediatrics

## 2017-03-09 DIAGNOSIS — G8110 Spastic hemiplegia affecting unspecified side: Secondary | ICD-10-CM | POA: Diagnosis not present
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
## 2017-03-12 ENCOUNTER — Other Ambulatory Visit: Payer: Self-pay | Admitting: Pediatrics

## 2017-04-10 ENCOUNTER — Other Ambulatory Visit (INDEPENDENT_AMBULATORY_CARE_PROVIDER_SITE_OTHER): Payer: Self-pay | Admitting: Pediatrics

## 2017-04-15 ENCOUNTER — Telehealth (INDEPENDENT_AMBULATORY_CARE_PROVIDER_SITE_OTHER): Payer: Self-pay

## 2017-04-15 NOTE — Telephone Encounter (Signed)
[name] has a baclofen pump refill on Monday, January 14.  This is the day of his alarm date.  I think that we should try to move this up to sometime on Friday and we need to decide when that will be.

## 2017-04-15 NOTE — Telephone Encounter (Signed)
Mrs. [name] called to see if [name] will be okay with his Baclofen pump with the snow coming this weekend. He has an appointment Tuesday the 15th and she is just worried they may not be able to get out. Please give her a call

## 2017-04-16 NOTE — Telephone Encounter (Signed)
Thank you :)

## 2017-04-16 NOTE — Telephone Encounter (Signed)
Spoke with Scott Bean about the appointment changing to tomorrow. She stated that they will be here at 9:00

## 2017-04-17 ENCOUNTER — Encounter (INDEPENDENT_AMBULATORY_CARE_PROVIDER_SITE_OTHER): Payer: Self-pay | Admitting: Pediatrics

## 2017-04-17 ENCOUNTER — Ambulatory Visit (INDEPENDENT_AMBULATORY_CARE_PROVIDER_SITE_OTHER): Payer: Medicaid Other | Admitting: Pediatrics

## 2017-04-17 DIAGNOSIS — G8110 Spastic hemiplegia affecting unspecified side: Secondary | ICD-10-CM
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
## 2017-04-20 ENCOUNTER — Encounter (INDEPENDENT_AMBULATORY_CARE_PROVIDER_SITE_OTHER): Payer: Medicaid Other | Admitting: Pediatrics

## 2017-05-08 ENCOUNTER — Telehealth (INDEPENDENT_AMBULATORY_CARE_PROVIDER_SITE_OTHER): Payer: Self-pay

## 2017-05-08 ENCOUNTER — Other Ambulatory Visit (INDEPENDENT_AMBULATORY_CARE_PROVIDER_SITE_OTHER): Payer: Self-pay | Admitting: Pediatrics

## 2017-05-08 DIAGNOSIS — G40309 Generalized idiopathic epilepsy and epileptic syndromes, not intractable, without status epilepticus: Secondary | ICD-10-CM

## 2017-05-08 MED ORDER — RANITIDINE HCL 15 MG/ML PO SYRP: 150.0000 mg | ORAL_SOLUTION | Freq: Two times a day (BID) | ORAL | 1 refills | Status: DC

## 2017-05-08 MED ORDER — TEGRETOL 100 MG/5ML PO SUSP: ORAL | 5 refills | Status: DC

## 2017-05-08 MED ORDER — LEVETIRACETAM 100 MG/ML PO SOLN: ORAL | 0 refills | Status: DC

## 2017-05-08 NOTE — Telephone Encounter (Signed)
Signed and returned for disposition 

## 2017-05-08 NOTE — Telephone Encounter (Signed)
Mrs. [name] called in for refills on [name]'s medication. He needs a new script written for Clonidine.

## 2017-05-29 ENCOUNTER — Encounter (INDEPENDENT_AMBULATORY_CARE_PROVIDER_SITE_OTHER): Payer: Self-pay | Admitting: Pediatrics

## 2017-05-29 ENCOUNTER — Ambulatory Visit (INDEPENDENT_AMBULATORY_CARE_PROVIDER_SITE_OTHER): Payer: Medicaid Other | Admitting: Pediatrics

## 2017-05-29 DIAGNOSIS — G8110 Spastic hemiplegia affecting unspecified side: Secondary | ICD-10-CM
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
## 2017-06-23 DIAGNOSIS — S06890A Other specified intracranial injury without loss of consciousness, initial encounter: Secondary | ICD-10-CM | POA: Diagnosis not present

## 2017-07-03 ENCOUNTER — Other Ambulatory Visit (INDEPENDENT_AMBULATORY_CARE_PROVIDER_SITE_OTHER): Payer: Self-pay | Admitting: Pediatrics

## 2017-07-09 ENCOUNTER — Other Ambulatory Visit (INDEPENDENT_AMBULATORY_CARE_PROVIDER_SITE_OTHER): Payer: Self-pay | Admitting: Pediatrics

## 2017-07-09 ENCOUNTER — Ambulatory Visit (INDEPENDENT_AMBULATORY_CARE_PROVIDER_SITE_OTHER): Payer: Medicaid Other | Admitting: Pediatrics

## 2017-07-09 ENCOUNTER — Encounter (INDEPENDENT_AMBULATORY_CARE_PROVIDER_SITE_OTHER): Payer: Self-pay | Admitting: Pediatrics

## 2017-07-09 DIAGNOSIS — G8110 Spastic hemiplegia affecting unspecified side: Secondary | ICD-10-CM | POA: Diagnosis not present

## 2017-07-09 DIAGNOSIS — G40309 Generalized idiopathic epilepsy and epileptic syndromes, not intractable, without status epilepticus: Secondary | ICD-10-CM

## 2017-07-09 MED ORDER — LEVETIRACETAM 100 MG/ML PO SOLN: ORAL | 5 refills | Status: DC
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
## 2017-08-03 ENCOUNTER — Other Ambulatory Visit (INDEPENDENT_AMBULATORY_CARE_PROVIDER_SITE_OTHER): Payer: Self-pay | Admitting: Pediatrics

## 2017-08-20 ENCOUNTER — Encounter (INDEPENDENT_AMBULATORY_CARE_PROVIDER_SITE_OTHER): Payer: Self-pay | Admitting: Pediatrics

## 2017-08-20 ENCOUNTER — Ambulatory Visit (INDEPENDENT_AMBULATORY_CARE_PROVIDER_SITE_OTHER): Payer: Medicaid Other | Admitting: Pediatrics

## 2017-08-20 DIAGNOSIS — G8110 Spastic hemiplegia affecting unspecified side: Secondary | ICD-10-CM | POA: Diagnosis not present
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
## 2017-09-02 ENCOUNTER — Other Ambulatory Visit (INDEPENDENT_AMBULATORY_CARE_PROVIDER_SITE_OTHER): Payer: Self-pay | Admitting: Pediatrics

## 2017-09-03 ENCOUNTER — Other Ambulatory Visit (INDEPENDENT_AMBULATORY_CARE_PROVIDER_SITE_OTHER): Payer: Self-pay | Admitting: Pediatrics

## 2017-09-03 DIAGNOSIS — G40309 Generalized idiopathic epilepsy and epileptic syndromes, not intractable, without status epilepticus: Secondary | ICD-10-CM

## 2017-09-03 MED ORDER — TEGRETOL 100 MG/5ML PO SUSP: ORAL | 5 refills | Status: DC

## 2017-09-03 MED ORDER — CARBAMAZEPINE 200 MG PO TABS: ORAL_TABLET | ORAL | 0 refills | Status: DC

## 2017-09-03 NOTE — Telephone Encounter (Signed)
Mrs. Scott Bean called stating that the pharmacy does not have Scott Bean's Tegretol. She stated that due to the holiday, they did not order the medication in time. She states that all of the pharmacies and the warehouse, do not have this medication in [place]. She states that Scott Bean will miss four doses due to the medication not being ready until tomorrow. She is wondering if the four doses are okay to miss or can we send it to another pharmacy. Please advise.

## 2017-09-03 NOTE — Telephone Encounter (Signed)
I called mother twice.  It seems that she is measuring a little bit more each time that she should.  She is a day 30 her treatment and she should have 34 days if she measured exactly.  I recommended that we give [name] 200 mg tablets 1-1/2 3 times daily and then 2 at bedtime which works out to 1300 mg very close to his 1320 that he would ordinarily take.  I told her to make sure she crush the tablets to find powder mix them in some fluid and then gave it through the G-tube.  I gave her 14 tablets which gives her 2 days worth.  I also asked the pharmacist to give her a syringe that exactly measures 10 mL and 6.5 mL.  He said that he would do so.  Prescriptions have been sent electronically.

## 2017-09-18 DIAGNOSIS — S06360D Traumatic hemorrhage of cerebrum, unspecified, without loss of consciousness, subsequent encounter: Secondary | ICD-10-CM | POA: Diagnosis not present

## 2017-09-18 DIAGNOSIS — T85733A Infection and inflammatory reaction due to implanted electronic neurostimulator of spinal cord, electrode (lead), initial encounter: Secondary | ICD-10-CM | POA: Diagnosis not present

## 2017-09-18 DIAGNOSIS — S06360A Traumatic hemorrhage of cerebrum, unspecified, without loss of consciousness, initial encounter: Secondary | ICD-10-CM | POA: Diagnosis not present

## 2017-09-18 DIAGNOSIS — G911 Obstructive hydrocephalus: Secondary | ICD-10-CM | POA: Diagnosis not present

## 2017-10-01 ENCOUNTER — Encounter (INDEPENDENT_AMBULATORY_CARE_PROVIDER_SITE_OTHER): Payer: Self-pay | Admitting: Pediatrics

## 2017-10-01 ENCOUNTER — Ambulatory Visit (INDEPENDENT_AMBULATORY_CARE_PROVIDER_SITE_OTHER): Payer: Medicaid Other | Admitting: Pediatrics

## 2017-10-01 DIAGNOSIS — G8110 Spastic hemiplegia affecting unspecified side: Secondary | ICD-10-CM | POA: Diagnosis not present
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
## 2017-10-03 ENCOUNTER — Other Ambulatory Visit (INDEPENDENT_AMBULATORY_CARE_PROVIDER_SITE_OTHER): Payer: Self-pay | Admitting: Pediatrics

## 2017-10-05 DIAGNOSIS — S06360D Traumatic hemorrhage of cerebrum, unspecified, without loss of consciousness, subsequent encounter: Secondary | ICD-10-CM | POA: Diagnosis not present

## 2017-10-05 DIAGNOSIS — S06360A Traumatic hemorrhage of cerebrum, unspecified, without loss of consciousness, initial encounter: Secondary | ICD-10-CM | POA: Diagnosis not present

## 2017-10-05 DIAGNOSIS — T85733A Infection and inflammatory reaction due to implanted electronic neurostimulator of spinal cord, electrode (lead), initial encounter: Secondary | ICD-10-CM | POA: Diagnosis not present

## 2017-10-05 DIAGNOSIS — G911 Obstructive hydrocephalus: Secondary | ICD-10-CM | POA: Diagnosis not present

## 2017-10-12 DIAGNOSIS — Z452 Encounter for adjustment and management of vascular access device: Secondary | ICD-10-CM | POA: Diagnosis not present

## 2017-10-12 DIAGNOSIS — G911 Obstructive hydrocephalus: Secondary | ICD-10-CM | POA: Diagnosis not present

## 2017-10-12 DIAGNOSIS — R32 Unspecified urinary incontinence: Secondary | ICD-10-CM | POA: Diagnosis not present

## 2017-10-12 DIAGNOSIS — Z87820 Personal history of traumatic brain injury: Secondary | ICD-10-CM | POA: Diagnosis not present

## 2017-10-12 DIAGNOSIS — R569 Unspecified convulsions: Secondary | ICD-10-CM | POA: Diagnosis not present

## 2017-10-12 DIAGNOSIS — R258 Other abnormal involuntary movements: Secondary | ICD-10-CM | POA: Diagnosis not present

## 2017-10-12 DIAGNOSIS — Z931 Gastrostomy status: Secondary | ICD-10-CM | POA: Diagnosis not present

## 2017-10-12 DIAGNOSIS — Z5181 Encounter for therapeutic drug level monitoring: Secondary | ICD-10-CM | POA: Diagnosis not present

## 2017-10-20 DIAGNOSIS — S06360D Traumatic hemorrhage of cerebrum, unspecified, without loss of consciousness, subsequent encounter: Secondary | ICD-10-CM | POA: Diagnosis not present

## 2017-10-20 DIAGNOSIS — S06360A Traumatic hemorrhage of cerebrum, unspecified, without loss of consciousness, initial encounter: Secondary | ICD-10-CM | POA: Diagnosis not present

## 2017-10-20 DIAGNOSIS — T85733A Infection and inflammatory reaction due to implanted electronic neurostimulator of spinal cord, electrode (lead), initial encounter: Secondary | ICD-10-CM | POA: Diagnosis not present

## 2017-10-20 DIAGNOSIS — G911 Obstructive hydrocephalus: Secondary | ICD-10-CM | POA: Diagnosis not present

## 2017-10-20 DIAGNOSIS — Z931 Gastrostomy status: Secondary | ICD-10-CM | POA: Diagnosis not present

## 2017-10-30 DIAGNOSIS — Z87820 Personal history of traumatic brain injury: Secondary | ICD-10-CM | POA: Diagnosis not present

## 2017-10-30 DIAGNOSIS — G911 Obstructive hydrocephalus: Secondary | ICD-10-CM | POA: Diagnosis not present

## 2017-10-30 DIAGNOSIS — R32 Unspecified urinary incontinence: Secondary | ICD-10-CM | POA: Diagnosis not present

## 2017-10-30 DIAGNOSIS — Z452 Encounter for adjustment and management of vascular access device: Secondary | ICD-10-CM | POA: Diagnosis not present

## 2017-10-30 DIAGNOSIS — Z931 Gastrostomy status: Secondary | ICD-10-CM | POA: Diagnosis not present

## 2017-10-30 DIAGNOSIS — R258 Other abnormal involuntary movements: Secondary | ICD-10-CM | POA: Diagnosis not present

## 2017-10-30 DIAGNOSIS — Z5181 Encounter for therapeutic drug level monitoring: Secondary | ICD-10-CM | POA: Diagnosis not present

## 2017-10-30 DIAGNOSIS — R569 Unspecified convulsions: Secondary | ICD-10-CM | POA: Diagnosis not present

## 2017-11-05 DIAGNOSIS — T85733A Infection and inflammatory reaction due to implanted electronic neurostimulator of spinal cord, electrode (lead), initial encounter: Secondary | ICD-10-CM | POA: Diagnosis not present

## 2017-11-05 DIAGNOSIS — S06360D Traumatic hemorrhage of cerebrum, unspecified, without loss of consciousness, subsequent encounter: Secondary | ICD-10-CM | POA: Diagnosis not present

## 2017-11-05 DIAGNOSIS — G911 Obstructive hydrocephalus: Secondary | ICD-10-CM | POA: Diagnosis not present

## 2017-11-05 DIAGNOSIS — S06360A Traumatic hemorrhage of cerebrum, unspecified, without loss of consciousness, initial encounter: Secondary | ICD-10-CM | POA: Diagnosis not present

## 2017-11-12 ENCOUNTER — Encounter (INDEPENDENT_AMBULATORY_CARE_PROVIDER_SITE_OTHER): Payer: Self-pay | Admitting: Pediatrics

## 2017-11-12 ENCOUNTER — Ambulatory Visit (INDEPENDENT_AMBULATORY_CARE_PROVIDER_SITE_OTHER): Payer: Medicaid Other | Admitting: Pediatrics

## 2017-11-12 DIAGNOSIS — I10 Essential (primary) hypertension: Secondary | ICD-10-CM | POA: Diagnosis not present

## 2017-11-12 DIAGNOSIS — K219 Gastro-esophageal reflux disease without esophagitis: Secondary | ICD-10-CM | POA: Insufficient documentation

## 2017-11-12 DIAGNOSIS — G8110 Spastic hemiplegia affecting unspecified side: Secondary | ICD-10-CM

## 2017-11-12 DIAGNOSIS — R569 Unspecified convulsions: Secondary | ICD-10-CM | POA: Diagnosis not present

## 2017-11-12 DIAGNOSIS — Z7689 Persons encountering health services in other specified circumstances: Secondary | ICD-10-CM | POA: Diagnosis not present
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
## 2017-11-16 DIAGNOSIS — Z931 Gastrostomy status: Secondary | ICD-10-CM | POA: Diagnosis not present

## 2017-11-19 DIAGNOSIS — S06360A Traumatic hemorrhage of cerebrum, unspecified, without loss of consciousness, initial encounter: Secondary | ICD-10-CM | POA: Diagnosis not present

## 2017-11-19 DIAGNOSIS — T85733A Infection and inflammatory reaction due to implanted electronic neurostimulator of spinal cord, electrode (lead), initial encounter: Secondary | ICD-10-CM | POA: Diagnosis not present

## 2017-11-19 DIAGNOSIS — S06360D Traumatic hemorrhage of cerebrum, unspecified, without loss of consciousness, subsequent encounter: Secondary | ICD-10-CM | POA: Diagnosis not present

## 2017-11-19 DIAGNOSIS — G911 Obstructive hydrocephalus: Secondary | ICD-10-CM | POA: Diagnosis not present

## 2017-12-06 DIAGNOSIS — G911 Obstructive hydrocephalus: Secondary | ICD-10-CM | POA: Diagnosis not present

## 2017-12-06 DIAGNOSIS — T85733A Infection and inflammatory reaction due to implanted electronic neurostimulator of spinal cord, electrode (lead), initial encounter: Secondary | ICD-10-CM | POA: Diagnosis not present

## 2017-12-06 DIAGNOSIS — S06360A Traumatic hemorrhage of cerebrum, unspecified, without loss of consciousness, initial encounter: Secondary | ICD-10-CM | POA: Diagnosis not present

## 2017-12-06 DIAGNOSIS — S06360D Traumatic hemorrhage of cerebrum, unspecified, without loss of consciousness, subsequent encounter: Secondary | ICD-10-CM | POA: Diagnosis not present

## 2017-12-14 ENCOUNTER — Telehealth (INDEPENDENT_AMBULATORY_CARE_PROVIDER_SITE_OTHER): Payer: Self-pay | Admitting: Pediatrics

## 2017-12-14 DIAGNOSIS — T85733A Infection and inflammatory reaction due to implanted electronic neurostimulator of spinal cord, electrode (lead), initial encounter: Secondary | ICD-10-CM | POA: Diagnosis not present

## 2017-12-14 NOTE — Telephone Encounter (Signed)
This is a gastrostomy tube.  We have not provided this referral will order to [facility].  We need to be the primary physician, or gastroenterologist.  I left a message for mother to call back but that is the issue.

## 2017-12-14 NOTE — Telephone Encounter (Signed)
Mom is requesting a rx for the [employer]

## 2017-12-14 NOTE — Telephone Encounter (Signed)
°  Who's calling (name and relationship to patient) : [name] (Mother) Best contact number: [phone] Provider they see: Dr. Sharene Skeans  Reason for call: Mom wanted to speak with someone about an rx for a Scott Bean for pt. Mom stated the type is a 14 [place] 2.0. She would like for there to be 4 refills on the Button so that the button will last the pt the entire year.       PRESCRIPTION REFILL ONLY  Name of prescription: Scott Bean 14 [place] 2.0 Pharmacy: [facility]   [phone]

## 2017-12-14 NOTE — Telephone Encounter (Signed)
[name], patient's mother, called the office stating this was not to be sent to [place]. The Mickey button is handled by a different physician.  Rufina Falco

## 2017-12-16 DIAGNOSIS — Z87820 Personal history of traumatic brain injury: Secondary | ICD-10-CM | POA: Diagnosis not present

## 2017-12-16 DIAGNOSIS — G911 Obstructive hydrocephalus: Secondary | ICD-10-CM | POA: Diagnosis not present

## 2017-12-16 DIAGNOSIS — Z931 Gastrostomy status: Secondary | ICD-10-CM | POA: Diagnosis not present

## 2017-12-16 DIAGNOSIS — Z5181 Encounter for therapeutic drug level monitoring: Secondary | ICD-10-CM | POA: Diagnosis not present

## 2017-12-16 DIAGNOSIS — R258 Other abnormal involuntary movements: Secondary | ICD-10-CM | POA: Diagnosis not present

## 2017-12-16 DIAGNOSIS — R569 Unspecified convulsions: Secondary | ICD-10-CM | POA: Diagnosis not present

## 2017-12-16 DIAGNOSIS — Z452 Encounter for adjustment and management of vascular access device: Secondary | ICD-10-CM | POA: Diagnosis not present

## 2017-12-16 DIAGNOSIS — R32 Unspecified urinary incontinence: Secondary | ICD-10-CM | POA: Diagnosis not present

## 2017-12-17 DIAGNOSIS — Z931 Gastrostomy status: Secondary | ICD-10-CM | POA: Diagnosis not present

## 2017-12-17 DIAGNOSIS — Z452 Encounter for adjustment and management of vascular access device: Secondary | ICD-10-CM | POA: Diagnosis not present

## 2017-12-17 DIAGNOSIS — G911 Obstructive hydrocephalus: Secondary | ICD-10-CM | POA: Diagnosis not present

## 2017-12-17 DIAGNOSIS — R258 Other abnormal involuntary movements: Secondary | ICD-10-CM | POA: Diagnosis not present

## 2017-12-17 DIAGNOSIS — R32 Unspecified urinary incontinence: Secondary | ICD-10-CM | POA: Diagnosis not present

## 2017-12-17 DIAGNOSIS — Z87820 Personal history of traumatic brain injury: Secondary | ICD-10-CM | POA: Diagnosis not present

## 2017-12-17 DIAGNOSIS — Z5181 Encounter for therapeutic drug level monitoring: Secondary | ICD-10-CM | POA: Diagnosis not present

## 2017-12-17 DIAGNOSIS — R569 Unspecified convulsions: Secondary | ICD-10-CM | POA: Diagnosis not present

## 2017-12-19 DIAGNOSIS — G911 Obstructive hydrocephalus: Secondary | ICD-10-CM | POA: Diagnosis not present

## 2017-12-19 DIAGNOSIS — S06360D Traumatic hemorrhage of cerebrum, unspecified, without loss of consciousness, subsequent encounter: Secondary | ICD-10-CM | POA: Diagnosis not present

## 2017-12-19 DIAGNOSIS — T85733A Infection and inflammatory reaction due to implanted electronic neurostimulator of spinal cord, electrode (lead), initial encounter: Secondary | ICD-10-CM | POA: Diagnosis not present

## 2017-12-19 DIAGNOSIS — S06360A Traumatic hemorrhage of cerebrum, unspecified, without loss of consciousness, initial encounter: Secondary | ICD-10-CM | POA: Diagnosis not present

## 2017-12-23 DIAGNOSIS — S06360A Traumatic hemorrhage of cerebrum, unspecified, without loss of consciousness, initial encounter: Secondary | ICD-10-CM | POA: Diagnosis not present

## 2017-12-23 DIAGNOSIS — G911 Obstructive hydrocephalus: Secondary | ICD-10-CM | POA: Diagnosis not present

## 2017-12-23 DIAGNOSIS — S06360D Traumatic hemorrhage of cerebrum, unspecified, without loss of consciousness, subsequent encounter: Secondary | ICD-10-CM | POA: Diagnosis not present

## 2017-12-23 DIAGNOSIS — T85733A Infection and inflammatory reaction due to implanted electronic neurostimulator of spinal cord, electrode (lead), initial encounter: Secondary | ICD-10-CM | POA: Diagnosis not present

## 2017-12-24 ENCOUNTER — Ambulatory Visit (INDEPENDENT_AMBULATORY_CARE_PROVIDER_SITE_OTHER): Payer: Medicaid Other | Admitting: Pediatrics

## 2017-12-24 ENCOUNTER — Encounter (INDEPENDENT_AMBULATORY_CARE_PROVIDER_SITE_OTHER): Payer: Self-pay | Admitting: Pediatrics

## 2017-12-24 DIAGNOSIS — G8110 Spastic hemiplegia affecting unspecified side: Secondary | ICD-10-CM | POA: Diagnosis not present

## 2017-12-24 DIAGNOSIS — Z7689 Persons encountering health services in other specified circumstances: Secondary | ICD-10-CM | POA: Diagnosis not present
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
## 2018-01-05 DIAGNOSIS — S06360A Traumatic hemorrhage of cerebrum, unspecified, without loss of consciousness, initial encounter: Secondary | ICD-10-CM | POA: Diagnosis not present

## 2018-01-05 DIAGNOSIS — S06360D Traumatic hemorrhage of cerebrum, unspecified, without loss of consciousness, subsequent encounter: Secondary | ICD-10-CM | POA: Diagnosis not present

## 2018-01-05 DIAGNOSIS — G911 Obstructive hydrocephalus: Secondary | ICD-10-CM | POA: Diagnosis not present

## 2018-01-05 DIAGNOSIS — T85733A Infection and inflammatory reaction due to implanted electronic neurostimulator of spinal cord, electrode (lead), initial encounter: Secondary | ICD-10-CM | POA: Diagnosis not present

## 2018-01-08 ENCOUNTER — Other Ambulatory Visit (INDEPENDENT_AMBULATORY_CARE_PROVIDER_SITE_OTHER): Payer: Self-pay | Admitting: Family

## 2018-01-08 ENCOUNTER — Other Ambulatory Visit (INDEPENDENT_AMBULATORY_CARE_PROVIDER_SITE_OTHER): Payer: Self-pay | Admitting: Pediatrics

## 2018-01-08 DIAGNOSIS — G40309 Generalized idiopathic epilepsy and epileptic syndromes, not intractable, without status epilepticus: Secondary | ICD-10-CM

## 2018-01-13 DIAGNOSIS — Z931 Gastrostomy status: Secondary | ICD-10-CM | POA: Diagnosis not present

## 2018-01-18 DIAGNOSIS — G911 Obstructive hydrocephalus: Secondary | ICD-10-CM | POA: Diagnosis not present

## 2018-01-18 DIAGNOSIS — T85733A Infection and inflammatory reaction due to implanted electronic neurostimulator of spinal cord, electrode (lead), initial encounter: Secondary | ICD-10-CM | POA: Diagnosis not present

## 2018-01-18 DIAGNOSIS — S06360A Traumatic hemorrhage of cerebrum, unspecified, without loss of consciousness, initial encounter: Secondary | ICD-10-CM | POA: Diagnosis not present

## 2018-01-18 DIAGNOSIS — S06360D Traumatic hemorrhage of cerebrum, unspecified, without loss of consciousness, subsequent encounter: Secondary | ICD-10-CM | POA: Diagnosis not present

## 2018-02-04 ENCOUNTER — Encounter (INDEPENDENT_AMBULATORY_CARE_PROVIDER_SITE_OTHER): Payer: Self-pay | Admitting: Pediatrics

## 2018-02-04 ENCOUNTER — Ambulatory Visit (INDEPENDENT_AMBULATORY_CARE_PROVIDER_SITE_OTHER): Payer: Medicaid Other | Admitting: Pediatrics

## 2018-02-04 DIAGNOSIS — G8110 Spastic hemiplegia affecting unspecified side: Secondary | ICD-10-CM

## 2018-02-04 DIAGNOSIS — Z7689 Persons encountering health services in other specified circumstances: Secondary | ICD-10-CM | POA: Diagnosis not present
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
## 2018-02-05 ENCOUNTER — Other Ambulatory Visit (INDEPENDENT_AMBULATORY_CARE_PROVIDER_SITE_OTHER): Payer: Self-pay | Admitting: Pediatrics

## 2018-02-05 DIAGNOSIS — G40309 Generalized idiopathic epilepsy and epileptic syndromes, not intractable, without status epilepticus: Secondary | ICD-10-CM

## 2018-02-05 DIAGNOSIS — S06360D Traumatic hemorrhage of cerebrum, unspecified, without loss of consciousness, subsequent encounter: Secondary | ICD-10-CM | POA: Diagnosis not present

## 2018-02-05 DIAGNOSIS — S06360A Traumatic hemorrhage of cerebrum, unspecified, without loss of consciousness, initial encounter: Secondary | ICD-10-CM | POA: Diagnosis not present

## 2018-02-05 DIAGNOSIS — T85733A Infection and inflammatory reaction due to implanted electronic neurostimulator of spinal cord, electrode (lead), initial encounter: Secondary | ICD-10-CM | POA: Diagnosis not present

## 2018-02-05 DIAGNOSIS — G911 Obstructive hydrocephalus: Secondary | ICD-10-CM | POA: Diagnosis not present

## 2018-02-11 DIAGNOSIS — R258 Other abnormal involuntary movements: Secondary | ICD-10-CM | POA: Diagnosis not present

## 2018-02-11 DIAGNOSIS — G911 Obstructive hydrocephalus: Secondary | ICD-10-CM | POA: Diagnosis not present

## 2018-02-11 DIAGNOSIS — Z5181 Encounter for therapeutic drug level monitoring: Secondary | ICD-10-CM | POA: Diagnosis not present

## 2018-02-11 DIAGNOSIS — Z931 Gastrostomy status: Secondary | ICD-10-CM | POA: Diagnosis not present

## 2018-02-11 DIAGNOSIS — Z87820 Personal history of traumatic brain injury: Secondary | ICD-10-CM | POA: Diagnosis not present

## 2018-02-11 DIAGNOSIS — Z452 Encounter for adjustment and management of vascular access device: Secondary | ICD-10-CM | POA: Diagnosis not present

## 2018-02-11 DIAGNOSIS — R32 Unspecified urinary incontinence: Secondary | ICD-10-CM | POA: Diagnosis not present

## 2018-02-11 DIAGNOSIS — Z7689 Persons encountering health services in other specified circumstances: Secondary | ICD-10-CM | POA: Diagnosis not present

## 2018-02-11 DIAGNOSIS — R569 Unspecified convulsions: Secondary | ICD-10-CM | POA: Diagnosis not present

## 2018-02-16 DIAGNOSIS — Z87820 Personal history of traumatic brain injury: Secondary | ICD-10-CM | POA: Diagnosis not present

## 2018-02-16 DIAGNOSIS — R258 Other abnormal involuntary movements: Secondary | ICD-10-CM | POA: Diagnosis not present

## 2018-02-16 DIAGNOSIS — G911 Obstructive hydrocephalus: Secondary | ICD-10-CM | POA: Diagnosis not present

## 2018-02-16 DIAGNOSIS — R569 Unspecified convulsions: Secondary | ICD-10-CM | POA: Diagnosis not present

## 2018-02-16 DIAGNOSIS — Z452 Encounter for adjustment and management of vascular access device: Secondary | ICD-10-CM | POA: Diagnosis not present

## 2018-02-16 DIAGNOSIS — Z931 Gastrostomy status: Secondary | ICD-10-CM | POA: Diagnosis not present

## 2018-02-16 DIAGNOSIS — Z5181 Encounter for therapeutic drug level monitoring: Secondary | ICD-10-CM | POA: Diagnosis not present

## 2018-02-16 DIAGNOSIS — R32 Unspecified urinary incontinence: Secondary | ICD-10-CM | POA: Diagnosis not present

## 2018-02-17 DIAGNOSIS — T85733A Infection and inflammatory reaction due to implanted electronic neurostimulator of spinal cord, electrode (lead), initial encounter: Secondary | ICD-10-CM | POA: Diagnosis not present

## 2018-02-17 DIAGNOSIS — G911 Obstructive hydrocephalus: Secondary | ICD-10-CM | POA: Diagnosis not present

## 2018-02-17 DIAGNOSIS — S06360A Traumatic hemorrhage of cerebrum, unspecified, without loss of consciousness, initial encounter: Secondary | ICD-10-CM | POA: Diagnosis not present

## 2018-02-17 DIAGNOSIS — S06360D Traumatic hemorrhage of cerebrum, unspecified, without loss of consciousness, subsequent encounter: Secondary | ICD-10-CM | POA: Diagnosis not present

## 2018-03-07 DIAGNOSIS — S06360D Traumatic hemorrhage of cerebrum, unspecified, without loss of consciousness, subsequent encounter: Secondary | ICD-10-CM | POA: Diagnosis not present

## 2018-03-07 DIAGNOSIS — T85733A Infection and inflammatory reaction due to implanted electronic neurostimulator of spinal cord, electrode (lead), initial encounter: Secondary | ICD-10-CM | POA: Diagnosis not present

## 2018-03-07 DIAGNOSIS — S06360A Traumatic hemorrhage of cerebrum, unspecified, without loss of consciousness, initial encounter: Secondary | ICD-10-CM | POA: Diagnosis not present

## 2018-03-07 DIAGNOSIS — G911 Obstructive hydrocephalus: Secondary | ICD-10-CM | POA: Diagnosis not present

## 2018-03-08 ENCOUNTER — Other Ambulatory Visit (INDEPENDENT_AMBULATORY_CARE_PROVIDER_SITE_OTHER): Payer: Self-pay | Admitting: Pediatrics

## 2018-03-08 DIAGNOSIS — G40309 Generalized idiopathic epilepsy and epileptic syndromes, not intractable, without status epilepticus: Secondary | ICD-10-CM

## 2018-03-17 DIAGNOSIS — Z931 Gastrostomy status: Secondary | ICD-10-CM | POA: Diagnosis not present

## 2018-03-18 ENCOUNTER — Encounter (INDEPENDENT_AMBULATORY_CARE_PROVIDER_SITE_OTHER): Payer: Self-pay | Admitting: Pediatrics

## 2018-03-18 ENCOUNTER — Ambulatory Visit (INDEPENDENT_AMBULATORY_CARE_PROVIDER_SITE_OTHER): Payer: Medicaid Other | Admitting: Pediatrics

## 2018-03-18 DIAGNOSIS — G8110 Spastic hemiplegia affecting unspecified side: Secondary | ICD-10-CM | POA: Diagnosis not present

## 2018-03-18 DIAGNOSIS — Z7689 Persons encountering health services in other specified circumstances: Secondary | ICD-10-CM | POA: Diagnosis not present
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
## 2018-03-19 DIAGNOSIS — G911 Obstructive hydrocephalus: Secondary | ICD-10-CM | POA: Diagnosis not present

## 2018-03-19 DIAGNOSIS — S06360A Traumatic hemorrhage of cerebrum, unspecified, without loss of consciousness, initial encounter: Secondary | ICD-10-CM | POA: Diagnosis not present

## 2018-03-19 DIAGNOSIS — T85733A Infection and inflammatory reaction due to implanted electronic neurostimulator of spinal cord, electrode (lead), initial encounter: Secondary | ICD-10-CM | POA: Diagnosis not present

## 2018-03-19 DIAGNOSIS — S06360D Traumatic hemorrhage of cerebrum, unspecified, without loss of consciousness, subsequent encounter: Secondary | ICD-10-CM | POA: Diagnosis not present

## 2018-04-07 DIAGNOSIS — G911 Obstructive hydrocephalus: Secondary | ICD-10-CM | POA: Diagnosis not present

## 2018-04-07 DIAGNOSIS — S06360A Traumatic hemorrhage of cerebrum, unspecified, without loss of consciousness, initial encounter: Secondary | ICD-10-CM | POA: Diagnosis not present

## 2018-04-07 DIAGNOSIS — T85733A Infection and inflammatory reaction due to implanted electronic neurostimulator of spinal cord, electrode (lead), initial encounter: Secondary | ICD-10-CM | POA: Diagnosis not present

## 2018-04-07 DIAGNOSIS — S06360D Traumatic hemorrhage of cerebrum, unspecified, without loss of consciousness, subsequent encounter: Secondary | ICD-10-CM | POA: Diagnosis not present

## 2018-04-08 ENCOUNTER — Other Ambulatory Visit (INDEPENDENT_AMBULATORY_CARE_PROVIDER_SITE_OTHER): Payer: Self-pay | Admitting: Pediatrics

## 2018-04-08 DIAGNOSIS — G40309 Generalized idiopathic epilepsy and epileptic syndromes, not intractable, without status epilepticus: Secondary | ICD-10-CM

## 2018-04-08 MED ORDER — TEGRETOL 100 MG/5ML PO SUSP: ORAL | 5 refills | Status: DC

## 2018-04-08 MED ORDER — LEVETIRACETAM 100 MG/ML PO SOLN: ORAL | 5 refills | Status: DC

## 2018-04-08 NOTE — Telephone Encounter (Signed)
Please send this to the pharmacy 

## 2018-04-08 NOTE — Addendum Note (Signed)
Addended by: Deetta Perla on: 04/08/2018 11:09 AM   Modules accepted: Orders

## 2018-04-08 NOTE — Telephone Encounter (Signed)
There were no refills because this was not discussed nor was it mentioned by mother.  There are now 5 refills.

## 2018-04-08 NOTE — Telephone Encounter (Signed)
Prescriptions represcribed with no refills, as this has been the case the last several months.  Patient has appointment with Dr Sharene Skeans this month where they can discuss having refills for longer period of time, as these seem to be chronic meds.   Lorenz Coaster MD MPH

## 2018-04-13 DIAGNOSIS — Z87820 Personal history of traumatic brain injury: Secondary | ICD-10-CM | POA: Diagnosis not present

## 2018-04-13 DIAGNOSIS — R569 Unspecified convulsions: Secondary | ICD-10-CM | POA: Diagnosis not present

## 2018-04-13 DIAGNOSIS — Z5181 Encounter for therapeutic drug level monitoring: Secondary | ICD-10-CM | POA: Diagnosis not present

## 2018-04-13 DIAGNOSIS — Z452 Encounter for adjustment and management of vascular access device: Secondary | ICD-10-CM | POA: Diagnosis not present

## 2018-04-13 DIAGNOSIS — G911 Obstructive hydrocephalus: Secondary | ICD-10-CM | POA: Diagnosis not present

## 2018-04-13 DIAGNOSIS — R32 Unspecified urinary incontinence: Secondary | ICD-10-CM | POA: Diagnosis not present

## 2018-04-13 DIAGNOSIS — Z931 Gastrostomy status: Secondary | ICD-10-CM | POA: Diagnosis not present

## 2018-04-13 DIAGNOSIS — R258 Other abnormal involuntary movements: Secondary | ICD-10-CM | POA: Diagnosis not present

## 2018-04-14 DIAGNOSIS — G911 Obstructive hydrocephalus: Secondary | ICD-10-CM | POA: Diagnosis not present

## 2018-04-14 DIAGNOSIS — R32 Unspecified urinary incontinence: Secondary | ICD-10-CM | POA: Diagnosis not present

## 2018-04-14 DIAGNOSIS — Z931 Gastrostomy status: Secondary | ICD-10-CM | POA: Diagnosis not present

## 2018-04-14 DIAGNOSIS — Z5181 Encounter for therapeutic drug level monitoring: Secondary | ICD-10-CM | POA: Diagnosis not present

## 2018-04-14 DIAGNOSIS — R569 Unspecified convulsions: Secondary | ICD-10-CM | POA: Diagnosis not present

## 2018-04-14 DIAGNOSIS — Z452 Encounter for adjustment and management of vascular access device: Secondary | ICD-10-CM | POA: Diagnosis not present

## 2018-04-14 DIAGNOSIS — Z87820 Personal history of traumatic brain injury: Secondary | ICD-10-CM | POA: Diagnosis not present

## 2018-04-14 DIAGNOSIS — R258 Other abnormal involuntary movements: Secondary | ICD-10-CM | POA: Diagnosis not present

## 2018-04-16 DIAGNOSIS — G911 Obstructive hydrocephalus: Secondary | ICD-10-CM | POA: Diagnosis not present

## 2018-04-16 DIAGNOSIS — S06360A Traumatic hemorrhage of cerebrum, unspecified, without loss of consciousness, initial encounter: Secondary | ICD-10-CM | POA: Diagnosis not present

## 2018-04-16 DIAGNOSIS — S06360D Traumatic hemorrhage of cerebrum, unspecified, without loss of consciousness, subsequent encounter: Secondary | ICD-10-CM | POA: Diagnosis not present

## 2018-04-16 DIAGNOSIS — T85733A Infection and inflammatory reaction due to implanted electronic neurostimulator of spinal cord, electrode (lead), initial encounter: Secondary | ICD-10-CM | POA: Diagnosis not present

## 2018-04-17 DIAGNOSIS — Z931 Gastrostomy status: Secondary | ICD-10-CM | POA: Diagnosis not present

## 2018-04-18 DIAGNOSIS — S06360A Traumatic hemorrhage of cerebrum, unspecified, without loss of consciousness, initial encounter: Secondary | ICD-10-CM | POA: Diagnosis not present

## 2018-04-18 DIAGNOSIS — S06360D Traumatic hemorrhage of cerebrum, unspecified, without loss of consciousness, subsequent encounter: Secondary | ICD-10-CM | POA: Diagnosis not present

## 2018-04-18 DIAGNOSIS — G911 Obstructive hydrocephalus: Secondary | ICD-10-CM | POA: Diagnosis not present

## 2018-04-18 DIAGNOSIS — T85733A Infection and inflammatory reaction due to implanted electronic neurostimulator of spinal cord, electrode (lead), initial encounter: Secondary | ICD-10-CM | POA: Diagnosis not present

## 2018-04-29 ENCOUNTER — Encounter (INDEPENDENT_AMBULATORY_CARE_PROVIDER_SITE_OTHER): Payer: Self-pay | Admitting: Pediatrics

## 2018-04-29 ENCOUNTER — Ambulatory Visit (INDEPENDENT_AMBULATORY_CARE_PROVIDER_SITE_OTHER): Payer: Medicaid Other | Admitting: Pediatrics

## 2018-04-29 DIAGNOSIS — G8110 Spastic hemiplegia affecting unspecified side: Secondary | ICD-10-CM | POA: Diagnosis not present

## 2018-04-29 DIAGNOSIS — Z7689 Persons encountering health services in other specified circumstances: Secondary | ICD-10-CM | POA: Diagnosis not present
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
## 2018-05-07 ENCOUNTER — Other Ambulatory Visit (INDEPENDENT_AMBULATORY_CARE_PROVIDER_SITE_OTHER): Payer: Self-pay | Admitting: Pediatrics

## 2018-05-08 DIAGNOSIS — G911 Obstructive hydrocephalus: Secondary | ICD-10-CM | POA: Diagnosis not present

## 2018-05-08 DIAGNOSIS — S06360D Traumatic hemorrhage of cerebrum, unspecified, without loss of consciousness, subsequent encounter: Secondary | ICD-10-CM | POA: Diagnosis not present

## 2018-05-08 DIAGNOSIS — S06360A Traumatic hemorrhage of cerebrum, unspecified, without loss of consciousness, initial encounter: Secondary | ICD-10-CM | POA: Diagnosis not present

## 2018-05-08 DIAGNOSIS — T85733A Infection and inflammatory reaction due to implanted electronic neurostimulator of spinal cord, electrode (lead), initial encounter: Secondary | ICD-10-CM | POA: Diagnosis not present

## 2018-05-15 DIAGNOSIS — Z931 Gastrostomy status: Secondary | ICD-10-CM | POA: Diagnosis not present

## 2018-05-18 DIAGNOSIS — S06360A Traumatic hemorrhage of cerebrum, unspecified, without loss of consciousness, initial encounter: Secondary | ICD-10-CM | POA: Diagnosis not present

## 2018-05-18 DIAGNOSIS — G911 Obstructive hydrocephalus: Secondary | ICD-10-CM | POA: Diagnosis not present

## 2018-05-18 DIAGNOSIS — T85733A Infection and inflammatory reaction due to implanted electronic neurostimulator of spinal cord, electrode (lead), initial encounter: Secondary | ICD-10-CM | POA: Diagnosis not present

## 2018-05-18 DIAGNOSIS — S06360D Traumatic hemorrhage of cerebrum, unspecified, without loss of consciousness, subsequent encounter: Secondary | ICD-10-CM | POA: Diagnosis not present

## 2018-06-06 DIAGNOSIS — S06360A Traumatic hemorrhage of cerebrum, unspecified, without loss of consciousness, initial encounter: Secondary | ICD-10-CM | POA: Diagnosis not present

## 2018-06-06 DIAGNOSIS — T85733A Infection and inflammatory reaction due to implanted electronic neurostimulator of spinal cord, electrode (lead), initial encounter: Secondary | ICD-10-CM | POA: Diagnosis not present

## 2018-06-06 DIAGNOSIS — G911 Obstructive hydrocephalus: Secondary | ICD-10-CM | POA: Diagnosis not present

## 2018-06-06 DIAGNOSIS — S06360D Traumatic hemorrhage of cerebrum, unspecified, without loss of consciousness, subsequent encounter: Secondary | ICD-10-CM | POA: Diagnosis not present

## 2018-06-08 ENCOUNTER — Other Ambulatory Visit (INDEPENDENT_AMBULATORY_CARE_PROVIDER_SITE_OTHER): Payer: Self-pay | Admitting: Pediatrics

## 2018-06-10 ENCOUNTER — Ambulatory Visit (INDEPENDENT_AMBULATORY_CARE_PROVIDER_SITE_OTHER): Payer: Medicaid Other | Admitting: Pediatrics

## 2018-06-10 ENCOUNTER — Encounter (INDEPENDENT_AMBULATORY_CARE_PROVIDER_SITE_OTHER): Payer: Self-pay | Admitting: Pediatrics

## 2018-06-10 DIAGNOSIS — Z7689 Persons encountering health services in other specified circumstances: Secondary | ICD-10-CM | POA: Diagnosis not present

## 2018-06-10 DIAGNOSIS — R258 Other abnormal involuntary movements: Secondary | ICD-10-CM | POA: Diagnosis not present

## 2018-06-10 DIAGNOSIS — R569 Unspecified convulsions: Secondary | ICD-10-CM | POA: Diagnosis not present

## 2018-06-10 DIAGNOSIS — Z87820 Personal history of traumatic brain injury: Secondary | ICD-10-CM | POA: Diagnosis not present

## 2018-06-10 DIAGNOSIS — Z931 Gastrostomy status: Secondary | ICD-10-CM | POA: Diagnosis not present

## 2018-06-10 DIAGNOSIS — Z5181 Encounter for therapeutic drug level monitoring: Secondary | ICD-10-CM | POA: Diagnosis not present

## 2018-06-10 DIAGNOSIS — Z452 Encounter for adjustment and management of vascular access device: Secondary | ICD-10-CM | POA: Diagnosis not present

## 2018-06-10 DIAGNOSIS — G911 Obstructive hydrocephalus: Secondary | ICD-10-CM | POA: Diagnosis not present

## 2018-06-10 DIAGNOSIS — G8110 Spastic hemiplegia affecting unspecified side: Secondary | ICD-10-CM

## 2018-06-10 DIAGNOSIS — R32 Unspecified urinary incontinence: Secondary | ICD-10-CM | POA: Diagnosis not present
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
## 2018-06-17 DIAGNOSIS — Z5181 Encounter for therapeutic drug level monitoring: Secondary | ICD-10-CM | POA: Diagnosis not present

## 2018-06-17 DIAGNOSIS — R258 Other abnormal involuntary movements: Secondary | ICD-10-CM | POA: Diagnosis not present

## 2018-06-17 DIAGNOSIS — R32 Unspecified urinary incontinence: Secondary | ICD-10-CM | POA: Diagnosis not present

## 2018-06-17 DIAGNOSIS — G911 Obstructive hydrocephalus: Secondary | ICD-10-CM | POA: Diagnosis not present

## 2018-06-17 DIAGNOSIS — Z87820 Personal history of traumatic brain injury: Secondary | ICD-10-CM | POA: Diagnosis not present

## 2018-06-17 DIAGNOSIS — R569 Unspecified convulsions: Secondary | ICD-10-CM | POA: Diagnosis not present

## 2018-06-17 DIAGNOSIS — Z931 Gastrostomy status: Secondary | ICD-10-CM | POA: Diagnosis not present

## 2018-06-17 DIAGNOSIS — Z452 Encounter for adjustment and management of vascular access device: Secondary | ICD-10-CM | POA: Diagnosis not present

## 2018-06-21 DIAGNOSIS — G911 Obstructive hydrocephalus: Secondary | ICD-10-CM | POA: Diagnosis not present

## 2018-06-21 DIAGNOSIS — S06360D Traumatic hemorrhage of cerebrum, unspecified, without loss of consciousness, subsequent encounter: Secondary | ICD-10-CM | POA: Diagnosis not present

## 2018-06-21 DIAGNOSIS — S06360A Traumatic hemorrhage of cerebrum, unspecified, without loss of consciousness, initial encounter: Secondary | ICD-10-CM | POA: Diagnosis not present

## 2018-06-21 DIAGNOSIS — T85733A Infection and inflammatory reaction due to implanted electronic neurostimulator of spinal cord, electrode (lead), initial encounter: Secondary | ICD-10-CM | POA: Diagnosis not present

## 2018-07-14 DIAGNOSIS — S06360A Traumatic hemorrhage of cerebrum, unspecified, without loss of consciousness, initial encounter: Secondary | ICD-10-CM | POA: Diagnosis not present

## 2018-07-14 DIAGNOSIS — G911 Obstructive hydrocephalus: Secondary | ICD-10-CM | POA: Diagnosis not present

## 2018-07-14 DIAGNOSIS — T85733A Infection and inflammatory reaction due to implanted electronic neurostimulator of spinal cord, electrode (lead), initial encounter: Secondary | ICD-10-CM | POA: Diagnosis not present

## 2018-07-14 DIAGNOSIS — S06360D Traumatic hemorrhage of cerebrum, unspecified, without loss of consciousness, subsequent encounter: Secondary | ICD-10-CM | POA: Diagnosis not present

## 2018-07-16 DIAGNOSIS — S06360A Traumatic hemorrhage of cerebrum, unspecified, without loss of consciousness, initial encounter: Secondary | ICD-10-CM | POA: Diagnosis not present

## 2018-07-16 DIAGNOSIS — G911 Obstructive hydrocephalus: Secondary | ICD-10-CM | POA: Diagnosis not present

## 2018-07-16 DIAGNOSIS — S06360D Traumatic hemorrhage of cerebrum, unspecified, without loss of consciousness, subsequent encounter: Secondary | ICD-10-CM | POA: Diagnosis not present

## 2018-07-16 DIAGNOSIS — T85733A Infection and inflammatory reaction due to implanted electronic neurostimulator of spinal cord, electrode (lead), initial encounter: Secondary | ICD-10-CM | POA: Diagnosis not present

## 2018-07-22 ENCOUNTER — Encounter (INDEPENDENT_AMBULATORY_CARE_PROVIDER_SITE_OTHER): Payer: Self-pay | Admitting: Pediatrics

## 2018-07-22 ENCOUNTER — Ambulatory Visit (INDEPENDENT_AMBULATORY_CARE_PROVIDER_SITE_OTHER): Payer: Medicaid Other | Admitting: Pediatrics

## 2018-07-22 ENCOUNTER — Other Ambulatory Visit: Payer: Self-pay

## 2018-07-22 DIAGNOSIS — G8110 Spastic hemiplegia affecting unspecified side: Secondary | ICD-10-CM
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
## 2018-07-28 DIAGNOSIS — K219 Gastro-esophageal reflux disease without esophagitis: Secondary | ICD-10-CM | POA: Diagnosis not present

## 2018-07-28 DIAGNOSIS — I10 Essential (primary) hypertension: Secondary | ICD-10-CM | POA: Diagnosis not present

## 2018-07-28 DIAGNOSIS — R569 Unspecified convulsions: Secondary | ICD-10-CM | POA: Diagnosis not present

## 2018-08-02 DIAGNOSIS — Z87820 Personal history of traumatic brain injury: Secondary | ICD-10-CM | POA: Diagnosis not present

## 2018-08-02 DIAGNOSIS — Z5181 Encounter for therapeutic drug level monitoring: Secondary | ICD-10-CM | POA: Diagnosis not present

## 2018-08-02 DIAGNOSIS — R569 Unspecified convulsions: Secondary | ICD-10-CM | POA: Diagnosis not present

## 2018-08-02 DIAGNOSIS — R258 Other abnormal involuntary movements: Secondary | ICD-10-CM | POA: Diagnosis not present

## 2018-08-02 DIAGNOSIS — Z452 Encounter for adjustment and management of vascular access device: Secondary | ICD-10-CM | POA: Diagnosis not present

## 2018-08-02 DIAGNOSIS — Z931 Gastrostomy status: Secondary | ICD-10-CM | POA: Diagnosis not present

## 2018-08-02 DIAGNOSIS — R32 Unspecified urinary incontinence: Secondary | ICD-10-CM | POA: Diagnosis not present

## 2018-08-02 DIAGNOSIS — G911 Obstructive hydrocephalus: Secondary | ICD-10-CM | POA: Diagnosis not present

## 2018-08-06 DIAGNOSIS — S06360D Traumatic hemorrhage of cerebrum, unspecified, without loss of consciousness, subsequent encounter: Secondary | ICD-10-CM | POA: Diagnosis not present

## 2018-08-09 DIAGNOSIS — R32 Unspecified urinary incontinence: Secondary | ICD-10-CM | POA: Diagnosis not present

## 2018-08-09 DIAGNOSIS — Z931 Gastrostomy status: Secondary | ICD-10-CM | POA: Diagnosis not present

## 2018-08-09 DIAGNOSIS — Z87820 Personal history of traumatic brain injury: Secondary | ICD-10-CM | POA: Diagnosis not present

## 2018-08-09 DIAGNOSIS — Z452 Encounter for adjustment and management of vascular access device: Secondary | ICD-10-CM | POA: Diagnosis not present

## 2018-08-09 DIAGNOSIS — G911 Obstructive hydrocephalus: Secondary | ICD-10-CM | POA: Diagnosis not present

## 2018-08-09 DIAGNOSIS — R569 Unspecified convulsions: Secondary | ICD-10-CM | POA: Diagnosis not present

## 2018-08-09 DIAGNOSIS — Z5181 Encounter for therapeutic drug level monitoring: Secondary | ICD-10-CM | POA: Diagnosis not present

## 2018-08-09 DIAGNOSIS — R258 Other abnormal involuntary movements: Secondary | ICD-10-CM | POA: Diagnosis not present

## 2018-08-19 DIAGNOSIS — S06360A Traumatic hemorrhage of cerebrum, unspecified, without loss of consciousness, initial encounter: Secondary | ICD-10-CM | POA: Diagnosis not present

## 2018-08-19 DIAGNOSIS — T85733A Infection and inflammatory reaction due to implanted electronic neurostimulator of spinal cord, electrode (lead), initial encounter: Secondary | ICD-10-CM | POA: Diagnosis not present

## 2018-08-19 DIAGNOSIS — G911 Obstructive hydrocephalus: Secondary | ICD-10-CM | POA: Diagnosis not present

## 2018-08-19 DIAGNOSIS — S06360D Traumatic hemorrhage of cerebrum, unspecified, without loss of consciousness, subsequent encounter: Secondary | ICD-10-CM | POA: Diagnosis not present

## 2018-09-02 ENCOUNTER — Ambulatory Visit (INDEPENDENT_AMBULATORY_CARE_PROVIDER_SITE_OTHER): Payer: Medicaid Other | Admitting: Pediatrics

## 2018-09-02 ENCOUNTER — Encounter (INDEPENDENT_AMBULATORY_CARE_PROVIDER_SITE_OTHER): Payer: Self-pay | Admitting: Pediatrics

## 2018-09-02 ENCOUNTER — Other Ambulatory Visit: Payer: Self-pay

## 2018-09-02 DIAGNOSIS — G8110 Spastic hemiplegia affecting unspecified side: Secondary | ICD-10-CM | POA: Diagnosis not present
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
## 2018-09-06 DIAGNOSIS — S06360D Traumatic hemorrhage of cerebrum, unspecified, without loss of consciousness, subsequent encounter: Secondary | ICD-10-CM | POA: Diagnosis not present

## 2018-09-13 DIAGNOSIS — R258 Other abnormal involuntary movements: Secondary | ICD-10-CM | POA: Diagnosis not present

## 2018-09-13 DIAGNOSIS — G911 Obstructive hydrocephalus: Secondary | ICD-10-CM | POA: Diagnosis not present

## 2018-09-13 DIAGNOSIS — R569 Unspecified convulsions: Secondary | ICD-10-CM | POA: Diagnosis not present

## 2018-09-13 DIAGNOSIS — Z931 Gastrostomy status: Secondary | ICD-10-CM | POA: Diagnosis not present

## 2018-09-13 DIAGNOSIS — Z87820 Personal history of traumatic brain injury: Secondary | ICD-10-CM | POA: Diagnosis not present

## 2018-09-13 DIAGNOSIS — R32 Unspecified urinary incontinence: Secondary | ICD-10-CM | POA: Diagnosis not present

## 2018-09-24 DIAGNOSIS — G911 Obstructive hydrocephalus: Secondary | ICD-10-CM | POA: Diagnosis not present

## 2018-09-24 DIAGNOSIS — S06360A Traumatic hemorrhage of cerebrum, unspecified, without loss of consciousness, initial encounter: Secondary | ICD-10-CM | POA: Diagnosis not present

## 2018-09-24 DIAGNOSIS — S06360D Traumatic hemorrhage of cerebrum, unspecified, without loss of consciousness, subsequent encounter: Secondary | ICD-10-CM | POA: Diagnosis not present

## 2018-09-24 DIAGNOSIS — T85733A Infection and inflammatory reaction due to implanted electronic neurostimulator of spinal cord, electrode (lead), initial encounter: Secondary | ICD-10-CM | POA: Diagnosis not present

## 2018-10-06 DIAGNOSIS — S06360D Traumatic hemorrhage of cerebrum, unspecified, without loss of consciousness, subsequent encounter: Secondary | ICD-10-CM | POA: Diagnosis not present

## 2018-10-11 DIAGNOSIS — R258 Other abnormal involuntary movements: Secondary | ICD-10-CM | POA: Diagnosis not present

## 2018-10-11 DIAGNOSIS — G911 Obstructive hydrocephalus: Secondary | ICD-10-CM | POA: Diagnosis not present

## 2018-10-11 DIAGNOSIS — Z87820 Personal history of traumatic brain injury: Secondary | ICD-10-CM | POA: Diagnosis not present

## 2018-10-11 DIAGNOSIS — R32 Unspecified urinary incontinence: Secondary | ICD-10-CM | POA: Diagnosis not present

## 2018-10-11 DIAGNOSIS — R569 Unspecified convulsions: Secondary | ICD-10-CM | POA: Diagnosis not present

## 2018-10-11 DIAGNOSIS — Z931 Gastrostomy status: Secondary | ICD-10-CM | POA: Diagnosis not present

## 2018-10-14 ENCOUNTER — Encounter (INDEPENDENT_AMBULATORY_CARE_PROVIDER_SITE_OTHER): Payer: Self-pay | Admitting: Pediatrics

## 2018-10-14 ENCOUNTER — Other Ambulatory Visit: Payer: Self-pay

## 2018-10-14 ENCOUNTER — Ambulatory Visit (INDEPENDENT_AMBULATORY_CARE_PROVIDER_SITE_OTHER): Payer: Medicaid Other | Admitting: Pediatrics

## 2018-10-14 DIAGNOSIS — G8110 Spastic hemiplegia affecting unspecified side: Secondary | ICD-10-CM
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
## 2018-10-18 ENCOUNTER — Other Ambulatory Visit: Payer: Self-pay

## 2018-10-19 ENCOUNTER — Encounter: Payer: Self-pay | Admitting: Family Medicine

## 2018-10-19 ENCOUNTER — Ambulatory Visit (INDEPENDENT_AMBULATORY_CARE_PROVIDER_SITE_OTHER): Payer: Medicaid Other | Admitting: Family Medicine

## 2018-10-19 VITALS — BP 137/76 | HR 87 | Temp 97.1°F

## 2018-10-19 DIAGNOSIS — G8110 Spastic hemiplegia affecting unspecified side: Secondary | ICD-10-CM | POA: Diagnosis not present

## 2018-10-19 DIAGNOSIS — Z7689 Persons encountering health services in other specified circumstances: Secondary | ICD-10-CM | POA: Diagnosis not present

## 2018-10-19 DIAGNOSIS — S06360D Traumatic hemorrhage of cerebrum, unspecified, without loss of consciousness, subsequent encounter: Secondary | ICD-10-CM | POA: Diagnosis not present

## 2018-10-19 DIAGNOSIS — G911 Obstructive hydrocephalus: Secondary | ICD-10-CM | POA: Diagnosis not present

## 2018-10-19 DIAGNOSIS — T85733A Infection and inflammatory reaction due to implanted electronic neurostimulator of spinal cord, electrode (lead), initial encounter: Secondary | ICD-10-CM | POA: Diagnosis not present

## 2018-10-19 DIAGNOSIS — S06360A Traumatic hemorrhage of cerebrum, unspecified, without loss of consciousness, initial encounter: Secondary | ICD-10-CM | POA: Diagnosis not present

## 2018-10-19 DIAGNOSIS — G40309 Generalized idiopathic epilepsy and epileptic syndromes, not intractable, without status epilepticus: Secondary | ICD-10-CM

## 2018-10-19 NOTE — Patient Instructions (Addendum)
Make sure he is getting regular eye exams to check intraocular pressure while on Tegretol.    You had labs performed today.  You will be contacted with the results of the labs once they are available, usually in the next 3 business days for routine lab work.  If you have an active my chart account, they will be released to your MyChart.  If you prefer to have these labs released to you via telephone, please let us know.  If you had a pap smear or biopsy performed, expect to be contacted in about 7-10 days.
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
## 2018-10-20 ENCOUNTER — Telehealth: Payer: Self-pay | Admitting: Family Medicine

## 2018-10-20 LAB — CMP14+EGFR
ALT: 39 IU/L (ref 0–44)
AST: 8 IU/L (ref 0–40)
Albumin/Globulin Ratio: 1.5 (ref 1.2–2.2)
Albumin: 4.8 g/dL (ref 4.1–5.2)
Alkaline Phosphatase: 163 IU/L — ABNORMAL HIGH (ref 39–117)
BUN/Creatinine Ratio: 25 — ABNORMAL HIGH (ref 9–20)
BUN: 9 mg/dL (ref 6–20)
Bilirubin Total: <0.2 mg/dL (ref 0.0–1.2)
CO2: 24 mmol/L (ref 20–29)
Calcium: 9.5 mg/dL (ref 8.7–10.2)
Chloride: 96 mmol/L (ref 96–106)
Creatinine, Ser: 0.36 mg/dL — ABNORMAL LOW (ref 0.76–1.27)
GFR calc Af Amer: 197 mL/min/{1.73_m2} (ref >59)
GFR calc non Af Amer: 170 mL/min/{1.73_m2} (ref >59)
Globulin, Total: 3.1 g/dL (ref 1.5–4.5)
Glucose: 92 mg/dL (ref 65–99)
Potassium: 4.3 mmol/L (ref 3.5–5.2)
Sodium: 136 mmol/L (ref 134–144)
Total Protein: 7.9 g/dL (ref 6.0–8.5)

## 2018-10-20 LAB — CARBAMAZEPINE LEVEL, TOTAL: Carbamazepine (Tegretol), S: 14.5 ug/mL (ref 4.0–12.0)

## 2018-10-20 LAB — CBC
Hematocrit: 43.8 % (ref 37.5–51.0)
Hemoglobin: 15.1 g/dL (ref 13.0–17.7)
MCH: 31.1 pg (ref 26.6–33.0)
MCHC: 34.5 g/dL (ref 31.5–35.7)
MCV: 90 fL (ref 79–97)
Platelets: 263 10*3/uL (ref 150–450)
RBC: 4.86 x10E6/uL (ref 4.14–5.80)
RDW: 13.3 % (ref 11.6–15.4)
WBC: 7.1 10*3/uL (ref 3.4–10.8)

## 2018-10-20 LAB — LEVETIRACETAM LEVEL: Levetiracetam Lvl: 18.1 ug/mL (ref 10.0–40.0)

## 2018-10-20 NOTE — Telephone Encounter (Signed)
Aware.  Provider has not reviewed.

## 2018-11-01 ENCOUNTER — Other Ambulatory Visit (INDEPENDENT_AMBULATORY_CARE_PROVIDER_SITE_OTHER): Payer: Self-pay | Admitting: Pediatrics

## 2018-11-01 DIAGNOSIS — G40309 Generalized idiopathic epilepsy and epileptic syndromes, not intractable, without status epilepticus: Secondary | ICD-10-CM

## 2018-11-19 DIAGNOSIS — S06360A Traumatic hemorrhage of cerebrum, unspecified, without loss of consciousness, initial encounter: Secondary | ICD-10-CM | POA: Diagnosis not present

## 2018-11-19 DIAGNOSIS — T85733A Infection and inflammatory reaction due to implanted electronic neurostimulator of spinal cord, electrode (lead), initial encounter: Secondary | ICD-10-CM | POA: Diagnosis not present

## 2018-11-19 DIAGNOSIS — G911 Obstructive hydrocephalus: Secondary | ICD-10-CM | POA: Diagnosis not present

## 2018-11-19 DIAGNOSIS — S06360D Traumatic hemorrhage of cerebrum, unspecified, without loss of consciousness, subsequent encounter: Secondary | ICD-10-CM | POA: Diagnosis not present

## 2018-11-25 ENCOUNTER — Encounter (INDEPENDENT_AMBULATORY_CARE_PROVIDER_SITE_OTHER): Payer: Self-pay | Admitting: Pediatrics

## 2018-11-25 ENCOUNTER — Other Ambulatory Visit: Payer: Self-pay

## 2018-11-25 ENCOUNTER — Ambulatory Visit (INDEPENDENT_AMBULATORY_CARE_PROVIDER_SITE_OTHER): Payer: Medicaid Other | Admitting: Pediatrics

## 2018-11-25 DIAGNOSIS — G8110 Spastic hemiplegia affecting unspecified side: Secondary | ICD-10-CM
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
## 2018-11-30 ENCOUNTER — Other Ambulatory Visit: Payer: Self-pay | Admitting: *Deleted

## 2018-11-30 MED ORDER — FLUTICASONE PROPIONATE 50 MCG/ACT NA SUSP: 2.0000 | Freq: Every day | NASAL | 1 refills | Status: DC

## 2018-12-07 DIAGNOSIS — G911 Obstructive hydrocephalus: Secondary | ICD-10-CM | POA: Diagnosis not present

## 2018-12-07 DIAGNOSIS — S06360A Traumatic hemorrhage of cerebrum, unspecified, without loss of consciousness, initial encounter: Secondary | ICD-10-CM | POA: Diagnosis not present

## 2018-12-07 DIAGNOSIS — S06360D Traumatic hemorrhage of cerebrum, unspecified, without loss of consciousness, subsequent encounter: Secondary | ICD-10-CM | POA: Diagnosis not present

## 2018-12-07 DIAGNOSIS — T85733A Infection and inflammatory reaction due to implanted electronic neurostimulator of spinal cord, electrode (lead), initial encounter: Secondary | ICD-10-CM | POA: Diagnosis not present

## 2018-12-08 DIAGNOSIS — R258 Other abnormal involuntary movements: Secondary | ICD-10-CM | POA: Diagnosis not present

## 2018-12-08 DIAGNOSIS — R32 Unspecified urinary incontinence: Secondary | ICD-10-CM | POA: Diagnosis not present

## 2018-12-08 DIAGNOSIS — Z931 Gastrostomy status: Secondary | ICD-10-CM | POA: Diagnosis not present

## 2018-12-08 DIAGNOSIS — R569 Unspecified convulsions: Secondary | ICD-10-CM | POA: Diagnosis not present

## 2018-12-08 DIAGNOSIS — Z87820 Personal history of traumatic brain injury: Secondary | ICD-10-CM | POA: Diagnosis not present

## 2018-12-08 DIAGNOSIS — G911 Obstructive hydrocephalus: Secondary | ICD-10-CM | POA: Diagnosis not present

## 2018-12-09 DIAGNOSIS — Z931 Gastrostomy status: Secondary | ICD-10-CM | POA: Diagnosis not present

## 2018-12-09 DIAGNOSIS — G911 Obstructive hydrocephalus: Secondary | ICD-10-CM | POA: Diagnosis not present

## 2018-12-09 DIAGNOSIS — R258 Other abnormal involuntary movements: Secondary | ICD-10-CM | POA: Diagnosis not present

## 2018-12-09 DIAGNOSIS — Z87820 Personal history of traumatic brain injury: Secondary | ICD-10-CM | POA: Diagnosis not present

## 2018-12-09 DIAGNOSIS — R569 Unspecified convulsions: Secondary | ICD-10-CM | POA: Diagnosis not present

## 2018-12-09 DIAGNOSIS — R32 Unspecified urinary incontinence: Secondary | ICD-10-CM | POA: Diagnosis not present

## 2018-12-10 DIAGNOSIS — S06360D Traumatic hemorrhage of cerebrum, unspecified, without loss of consciousness, subsequent encounter: Secondary | ICD-10-CM | POA: Diagnosis not present

## 2018-12-10 DIAGNOSIS — G911 Obstructive hydrocephalus: Secondary | ICD-10-CM | POA: Diagnosis not present

## 2018-12-10 DIAGNOSIS — S06360A Traumatic hemorrhage of cerebrum, unspecified, without loss of consciousness, initial encounter: Secondary | ICD-10-CM | POA: Diagnosis not present

## 2018-12-10 DIAGNOSIS — T85733A Infection and inflammatory reaction due to implanted electronic neurostimulator of spinal cord, electrode (lead), initial encounter: Secondary | ICD-10-CM | POA: Diagnosis not present

## 2018-12-22 ENCOUNTER — Ambulatory Visit (INDEPENDENT_AMBULATORY_CARE_PROVIDER_SITE_OTHER): Payer: Medicaid Other

## 2018-12-22 ENCOUNTER — Other Ambulatory Visit: Payer: Self-pay

## 2018-12-22 DIAGNOSIS — Z87820 Personal history of traumatic brain injury: Secondary | ICD-10-CM

## 2018-12-22 DIAGNOSIS — R258 Other abnormal involuntary movements: Secondary | ICD-10-CM | POA: Diagnosis not present

## 2018-12-22 DIAGNOSIS — G911 Obstructive hydrocephalus: Secondary | ICD-10-CM

## 2018-12-22 DIAGNOSIS — R569 Unspecified convulsions: Secondary | ICD-10-CM | POA: Diagnosis not present

## 2018-12-22 DIAGNOSIS — Z931 Gastrostomy status: Secondary | ICD-10-CM

## 2018-12-22 DIAGNOSIS — R32 Unspecified urinary incontinence: Secondary | ICD-10-CM

## 2018-12-31 ENCOUNTER — Telehealth (INDEPENDENT_AMBULATORY_CARE_PROVIDER_SITE_OTHER): Payer: Self-pay | Admitting: Pediatrics

## 2018-12-31 DIAGNOSIS — G40309 Generalized idiopathic epilepsy and epileptic syndromes, not intractable, without status epilepticus: Secondary | ICD-10-CM

## 2018-12-31 MED ORDER — CARBAMAZEPINE 100 MG PO CHEW: CHEWABLE_TABLET | ORAL | 0 refills | Status: DC

## 2018-12-31 NOTE — Telephone Encounter (Signed)
Please send the pill form to the pharmacy for [facility]

## 2018-12-31 NOTE — Telephone Encounter (Signed)
Prescription has been sent as requested.  100 mg chewable carbamazepine was used

## 2018-12-31 NOTE — Telephone Encounter (Signed)
°  Who's calling (name and relationship to patient) : [name] (Mother)  Best contact number: [phone] Provider they see: Dr. Gaynell Face  Reason for call: Mother stated pharmacy informed her that rx supplier would not have liquid Tegretol ready until Monday. Mom wanted to know if Dr. Gaynell Face could send in request for a few Tegretol pills to last pt until Monday.      PRESCRIPTION REFILL ONLY  Name of prescription: Tegretol  Pharmacy: [facility]

## 2019-01-06 ENCOUNTER — Other Ambulatory Visit: Payer: Self-pay

## 2019-01-06 ENCOUNTER — Encounter (INDEPENDENT_AMBULATORY_CARE_PROVIDER_SITE_OTHER): Payer: Self-pay | Admitting: Pediatrics

## 2019-01-06 ENCOUNTER — Ambulatory Visit (INDEPENDENT_AMBULATORY_CARE_PROVIDER_SITE_OTHER): Payer: Medicaid Other | Admitting: Pediatrics

## 2019-01-06 DIAGNOSIS — G8110 Spastic hemiplegia affecting unspecified side: Secondary | ICD-10-CM | POA: Diagnosis not present

## 2019-01-06 DIAGNOSIS — S06360D Traumatic hemorrhage of cerebrum, unspecified, without loss of consciousness, subsequent encounter: Secondary | ICD-10-CM | POA: Diagnosis not present

## 2019-01-06 DIAGNOSIS — T85733A Infection and inflammatory reaction due to implanted electronic neurostimulator of spinal cord, electrode (lead), initial encounter: Secondary | ICD-10-CM | POA: Diagnosis not present

## 2019-01-06 DIAGNOSIS — S06360A Traumatic hemorrhage of cerebrum, unspecified, without loss of consciousness, initial encounter: Secondary | ICD-10-CM | POA: Diagnosis not present

## 2019-01-06 DIAGNOSIS — G40309 Generalized idiopathic epilepsy and epileptic syndromes, not intractable, without status epilepticus: Secondary | ICD-10-CM

## 2019-01-06 DIAGNOSIS — G911 Obstructive hydrocephalus: Secondary | ICD-10-CM | POA: Diagnosis not present
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
## 2019-01-06 NOTE — Patient Instructions (Signed)
I am glad that he is doing well.  We will see you on November 12

## 2019-01-10 DIAGNOSIS — T85733A Infection and inflammatory reaction due to implanted electronic neurostimulator of spinal cord, electrode (lead), initial encounter: Secondary | ICD-10-CM | POA: Diagnosis not present

## 2019-01-10 DIAGNOSIS — S06360A Traumatic hemorrhage of cerebrum, unspecified, without loss of consciousness, initial encounter: Secondary | ICD-10-CM | POA: Diagnosis not present

## 2019-01-10 DIAGNOSIS — S06360D Traumatic hemorrhage of cerebrum, unspecified, without loss of consciousness, subsequent encounter: Secondary | ICD-10-CM | POA: Diagnosis not present

## 2019-01-10 DIAGNOSIS — G911 Obstructive hydrocephalus: Secondary | ICD-10-CM | POA: Diagnosis not present

## 2019-01-11 DIAGNOSIS — S06360D Traumatic hemorrhage of cerebrum, unspecified, without loss of consciousness, subsequent encounter: Secondary | ICD-10-CM | POA: Diagnosis not present

## 2019-01-11 DIAGNOSIS — S06360A Traumatic hemorrhage of cerebrum, unspecified, without loss of consciousness, initial encounter: Secondary | ICD-10-CM | POA: Diagnosis not present

## 2019-01-11 DIAGNOSIS — G911 Obstructive hydrocephalus: Secondary | ICD-10-CM | POA: Diagnosis not present

## 2019-01-11 DIAGNOSIS — T85733A Infection and inflammatory reaction due to implanted electronic neurostimulator of spinal cord, electrode (lead), initial encounter: Secondary | ICD-10-CM | POA: Diagnosis not present

## 2019-02-06 DIAGNOSIS — T85733A Infection and inflammatory reaction due to implanted electronic neurostimulator of spinal cord, electrode (lead), initial encounter: Secondary | ICD-10-CM | POA: Diagnosis not present

## 2019-02-06 DIAGNOSIS — S06360D Traumatic hemorrhage of cerebrum, unspecified, without loss of consciousness, subsequent encounter: Secondary | ICD-10-CM | POA: Diagnosis not present

## 2019-02-06 DIAGNOSIS — G911 Obstructive hydrocephalus: Secondary | ICD-10-CM | POA: Diagnosis not present

## 2019-02-06 DIAGNOSIS — S06360A Traumatic hemorrhage of cerebrum, unspecified, without loss of consciousness, initial encounter: Secondary | ICD-10-CM | POA: Diagnosis not present

## 2019-02-08 DIAGNOSIS — R569 Unspecified convulsions: Secondary | ICD-10-CM | POA: Diagnosis not present

## 2019-02-08 DIAGNOSIS — Z87820 Personal history of traumatic brain injury: Secondary | ICD-10-CM | POA: Diagnosis not present

## 2019-02-08 DIAGNOSIS — R32 Unspecified urinary incontinence: Secondary | ICD-10-CM | POA: Diagnosis not present

## 2019-02-08 DIAGNOSIS — Z931 Gastrostomy status: Secondary | ICD-10-CM | POA: Diagnosis not present

## 2019-02-08 DIAGNOSIS — G911 Obstructive hydrocephalus: Secondary | ICD-10-CM | POA: Diagnosis not present

## 2019-02-08 DIAGNOSIS — R258 Other abnormal involuntary movements: Secondary | ICD-10-CM | POA: Diagnosis not present

## 2019-02-10 DIAGNOSIS — S06360D Traumatic hemorrhage of cerebrum, unspecified, without loss of consciousness, subsequent encounter: Secondary | ICD-10-CM | POA: Diagnosis not present

## 2019-02-10 DIAGNOSIS — G911 Obstructive hydrocephalus: Secondary | ICD-10-CM | POA: Diagnosis not present

## 2019-02-10 DIAGNOSIS — T85733A Infection and inflammatory reaction due to implanted electronic neurostimulator of spinal cord, electrode (lead), initial encounter: Secondary | ICD-10-CM | POA: Diagnosis not present

## 2019-02-10 DIAGNOSIS — S06360A Traumatic hemorrhage of cerebrum, unspecified, without loss of consciousness, initial encounter: Secondary | ICD-10-CM | POA: Diagnosis not present

## 2019-02-11 ENCOUNTER — Ambulatory Visit (INDEPENDENT_AMBULATORY_CARE_PROVIDER_SITE_OTHER): Payer: Medicaid Other | Admitting: Pediatrics

## 2019-02-11 ENCOUNTER — Other Ambulatory Visit: Payer: Self-pay

## 2019-02-11 ENCOUNTER — Encounter (INDEPENDENT_AMBULATORY_CARE_PROVIDER_SITE_OTHER): Payer: Self-pay | Admitting: Pediatrics

## 2019-02-11 DIAGNOSIS — G8110 Spastic hemiplegia affecting unspecified side: Secondary | ICD-10-CM

## 2019-02-11 NOTE — Patient Instructions (Signed)
We will plan to see you on December 11.  That is a week earlier than the device should empty.  I do not think there will be any problem.  If for some reason he is getting stiffer, give me a call and I will be happy to see him.  I think the pump is working fine.
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
## 2019-02-17 ENCOUNTER — Encounter (INDEPENDENT_AMBULATORY_CARE_PROVIDER_SITE_OTHER): Payer: Medicaid Other | Admitting: Pediatrics

## 2019-02-28 ENCOUNTER — Other Ambulatory Visit (INDEPENDENT_AMBULATORY_CARE_PROVIDER_SITE_OTHER): Payer: Self-pay | Admitting: Pediatrics

## 2019-02-28 DIAGNOSIS — G40309 Generalized idiopathic epilepsy and epileptic syndromes, not intractable, without status epilepticus: Secondary | ICD-10-CM

## 2019-02-28 NOTE — Telephone Encounter (Signed)
Please send to the pharmacy °

## 2019-03-01 ENCOUNTER — Ambulatory Visit (INDEPENDENT_AMBULATORY_CARE_PROVIDER_SITE_OTHER): Payer: Medicaid Other

## 2019-03-01 ENCOUNTER — Other Ambulatory Visit: Payer: Self-pay

## 2019-03-01 DIAGNOSIS — Z87820 Personal history of traumatic brain injury: Secondary | ICD-10-CM

## 2019-03-01 DIAGNOSIS — R569 Unspecified convulsions: Secondary | ICD-10-CM | POA: Diagnosis not present

## 2019-03-01 DIAGNOSIS — R258 Other abnormal involuntary movements: Secondary | ICD-10-CM

## 2019-03-01 DIAGNOSIS — R32 Unspecified urinary incontinence: Secondary | ICD-10-CM | POA: Diagnosis not present

## 2019-03-01 DIAGNOSIS — G911 Obstructive hydrocephalus: Secondary | ICD-10-CM | POA: Diagnosis not present

## 2019-03-08 DIAGNOSIS — S06360A Traumatic hemorrhage of cerebrum, unspecified, without loss of consciousness, initial encounter: Secondary | ICD-10-CM | POA: Diagnosis not present

## 2019-03-08 DIAGNOSIS — G911 Obstructive hydrocephalus: Secondary | ICD-10-CM | POA: Diagnosis not present

## 2019-03-08 DIAGNOSIS — S06360D Traumatic hemorrhage of cerebrum, unspecified, without loss of consciousness, subsequent encounter: Secondary | ICD-10-CM | POA: Diagnosis not present

## 2019-03-08 DIAGNOSIS — T85733A Infection and inflammatory reaction due to implanted electronic neurostimulator of spinal cord, electrode (lead), initial encounter: Secondary | ICD-10-CM | POA: Diagnosis not present

## 2019-03-14 DIAGNOSIS — G911 Obstructive hydrocephalus: Secondary | ICD-10-CM | POA: Diagnosis not present

## 2019-03-14 DIAGNOSIS — S06360A Traumatic hemorrhage of cerebrum, unspecified, without loss of consciousness, initial encounter: Secondary | ICD-10-CM | POA: Diagnosis not present

## 2019-03-14 DIAGNOSIS — S06360D Traumatic hemorrhage of cerebrum, unspecified, without loss of consciousness, subsequent encounter: Secondary | ICD-10-CM | POA: Diagnosis not present

## 2019-03-14 DIAGNOSIS — T85733A Infection and inflammatory reaction due to implanted electronic neurostimulator of spinal cord, electrode (lead), initial encounter: Secondary | ICD-10-CM | POA: Diagnosis not present

## 2019-03-18 ENCOUNTER — Encounter (INDEPENDENT_AMBULATORY_CARE_PROVIDER_SITE_OTHER): Payer: Self-pay | Admitting: Pediatrics

## 2019-03-18 ENCOUNTER — Other Ambulatory Visit: Payer: Self-pay

## 2019-03-18 ENCOUNTER — Ambulatory Visit (INDEPENDENT_AMBULATORY_CARE_PROVIDER_SITE_OTHER): Payer: Medicaid Other | Admitting: Pediatrics

## 2019-03-18 DIAGNOSIS — R404 Transient alteration of awareness: Secondary | ICD-10-CM

## 2019-03-18 DIAGNOSIS — G8110 Spastic hemiplegia affecting unspecified side: Secondary | ICD-10-CM
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
## 2019-03-18 NOTE — Patient Instructions (Signed)
Please make a video of the behaviors that occur 3 times a day and are associated with change in awareness, eye movements, and may represent some change in his seizures.  Focus on his face.  Either send it by email which may be hard because the files are very big for bring it when you come next time

## 2019-03-24 ENCOUNTER — Encounter: Payer: Self-pay | Admitting: Family

## 2019-03-24 ENCOUNTER — Ambulatory Visit (INDEPENDENT_AMBULATORY_CARE_PROVIDER_SITE_OTHER): Payer: Medicaid Other | Admitting: Family

## 2019-03-24 ENCOUNTER — Telehealth: Payer: Self-pay | Admitting: Family Medicine

## 2019-03-24 DIAGNOSIS — L0291 Cutaneous abscess, unspecified: Secondary | ICD-10-CM | POA: Diagnosis not present

## 2019-03-24 MED ORDER — SULFAMETHOXAZOLE-TRIMETHOPRIM 800-160 MG PO TABS: 1.0000 | ORAL_TABLET | Freq: Two times a day (BID) | ORAL | 0 refills | Status: DC

## 2019-03-24 NOTE — Progress Notes (Signed)
   Virtual Visit via telephone Note Due to COVID-19 pandemic this visit was conducted virtually. This visit type was conducted due to national recommendations for restrictions regarding the COVID-19 Pandemic (e.g. social distancing, sheltering in place) in an effort to limit this patient's exposure and mitigate transmission in our community. All issues noted in this document were discussed and addressed.  A physical exam was not performed with this format.  I connected with Scott Bean's mother on 03/24/19 at 3:44 pm by telephone and verified that I am speaking with the correct person using two identifiers. Scott Bean is currently located at home and mother is currently with him  during visit. The provider, Evelina Dun, FNP is located in their office at time of visit.  I discussed the limitations, risks, security and privacy concerns of performing an evaluation and management service by telephone and the availability of in person appointments. I also discussed with the patient that there may be a patient responsible charge related to this service. The patient expressed understanding and agreed to proceed.   History and Present Illness:  HPI  Mother calls that she noticed an abscess on his posterior neck about 2 weeks ago. She has applied warm compresses and it started draining moderate amount of white, bloody discharge. She reports the area is very red and tender.   Pt has hx of TBI and his mother is his caregiver.   Review of Systems  Skin:       Abscess   All other systems reviewed and are negative.    Observations/Objective: Pt is nonverbal   Assessment and Plan: 1. Abscess Keep clean and dry  Warm compresses  Call if area worsen or does not improve  - sulfamethoxazole-trimethoprim (BACTRIM DS) 800-160 MG tablet; Take 1 tablet by mouth 2 (two) times daily.  Dispense: 14 tablet; Refill: 0     I discussed the assessment and treatment plan with the patient. The  patient was provided an opportunity to ask questions and all were answered. The patient agreed with the plan and demonstrated an understanding of the instructions.   The patient was advised to call back or seek an in-person evaluation if the symptoms worsen or if the condition fails to improve as anticipated.  The above assessment and management plan was discussed with the patient. The patient verbalized understanding of and has agreed to the management plan. Patient is aware to call the clinic if symptoms persist or worsen. Patient is aware when to return to the clinic for a follow-up visit. Patient educated on when it is appropriate to go to the emergency department.   Time call ended:  3:55 pm   I provided 11 minutes of non-face-to-face time during this encounter.    Evelina Dun, FNP

## 2019-03-24 NOTE — Telephone Encounter (Signed)
Televisit made  

## 2019-03-24 NOTE — Telephone Encounter (Signed)
Patients mom called stating that pt has a boil on the back of his neck that is now draining. Wants to know if patient needs an antibiotic.

## 2019-04-02 ENCOUNTER — Other Ambulatory Visit (INDEPENDENT_AMBULATORY_CARE_PROVIDER_SITE_OTHER): Payer: Self-pay | Admitting: Pediatrics

## 2019-04-02 ENCOUNTER — Telehealth: Payer: Self-pay | Admitting: Family Medicine

## 2019-04-02 DIAGNOSIS — L0291 Cutaneous abscess, unspecified: Secondary | ICD-10-CM

## 2019-04-02 DIAGNOSIS — G40309 Generalized idiopathic epilepsy and epileptic syndromes, not intractable, without status epilepticus: Secondary | ICD-10-CM

## 2019-04-02 MED ORDER — SULFAMETHOXAZOLE-TRIMETHOPRIM 800-160 MG PO TABS: 1.0000 | ORAL_TABLET | Freq: Two times a day (BID) | ORAL | 0 refills | Status: AC

## 2019-04-02 NOTE — Telephone Encounter (Signed)
Mom called to report the abscess on Scott Bean's neck has improved some, but she can tell there is still stuff in there that needs to come out.  States the swelling did go down and the redness did lighten up but neither are completely gone.  I told her I would send in a few more days of antibiotic as he has only just completed the 7 days last night but that if it does not improve she should bring him into the office to be seen.  She has been and will continue applying warm compresses.

## 2019-04-08 DIAGNOSIS — R258 Other abnormal involuntary movements: Secondary | ICD-10-CM | POA: Diagnosis not present

## 2019-04-08 DIAGNOSIS — R569 Unspecified convulsions: Secondary | ICD-10-CM | POA: Diagnosis not present

## 2019-04-08 DIAGNOSIS — R32 Unspecified urinary incontinence: Secondary | ICD-10-CM | POA: Diagnosis not present

## 2019-04-08 DIAGNOSIS — S06360A Traumatic hemorrhage of cerebrum, unspecified, without loss of consciousness, initial encounter: Secondary | ICD-10-CM | POA: Diagnosis not present

## 2019-04-08 DIAGNOSIS — Z79891 Long term (current) use of opiate analgesic: Secondary | ICD-10-CM | POA: Diagnosis not present

## 2019-04-08 DIAGNOSIS — Z9181 History of falling: Secondary | ICD-10-CM | POA: Diagnosis not present

## 2019-04-08 DIAGNOSIS — Z87820 Personal history of traumatic brain injury: Secondary | ICD-10-CM | POA: Diagnosis not present

## 2019-04-08 DIAGNOSIS — S06360D Traumatic hemorrhage of cerebrum, unspecified, without loss of consciousness, subsequent encounter: Secondary | ICD-10-CM | POA: Diagnosis not present

## 2019-04-08 DIAGNOSIS — T85733A Infection and inflammatory reaction due to implanted electronic neurostimulator of spinal cord, electrode (lead), initial encounter: Secondary | ICD-10-CM | POA: Diagnosis not present

## 2019-04-08 DIAGNOSIS — Z931 Gastrostomy status: Secondary | ICD-10-CM | POA: Diagnosis not present

## 2019-04-08 DIAGNOSIS — G911 Obstructive hydrocephalus: Secondary | ICD-10-CM | POA: Diagnosis not present

## 2019-04-12 DIAGNOSIS — G911 Obstructive hydrocephalus: Secondary | ICD-10-CM | POA: Diagnosis not present

## 2019-04-12 DIAGNOSIS — S06360A Traumatic hemorrhage of cerebrum, unspecified, without loss of consciousness, initial encounter: Secondary | ICD-10-CM | POA: Diagnosis not present

## 2019-04-12 DIAGNOSIS — S06360D Traumatic hemorrhage of cerebrum, unspecified, without loss of consciousness, subsequent encounter: Secondary | ICD-10-CM | POA: Diagnosis not present

## 2019-04-12 DIAGNOSIS — T85733A Infection and inflammatory reaction due to implanted electronic neurostimulator of spinal cord, electrode (lead), initial encounter: Secondary | ICD-10-CM | POA: Diagnosis not present

## 2019-04-20 ENCOUNTER — Ambulatory Visit (INDEPENDENT_AMBULATORY_CARE_PROVIDER_SITE_OTHER): Payer: Medicaid Other

## 2019-04-20 ENCOUNTER — Other Ambulatory Visit: Payer: Self-pay

## 2019-04-20 DIAGNOSIS — Z931 Gastrostomy status: Secondary | ICD-10-CM

## 2019-04-20 DIAGNOSIS — Z79891 Long term (current) use of opiate analgesic: Secondary | ICD-10-CM

## 2019-04-20 DIAGNOSIS — R569 Unspecified convulsions: Secondary | ICD-10-CM

## 2019-04-20 DIAGNOSIS — Z87820 Personal history of traumatic brain injury: Secondary | ICD-10-CM

## 2019-04-20 DIAGNOSIS — Z79899 Other long term (current) drug therapy: Secondary | ICD-10-CM

## 2019-04-20 DIAGNOSIS — Z9181 History of falling: Secondary | ICD-10-CM | POA: Diagnosis not present

## 2019-04-20 DIAGNOSIS — R258 Other abnormal involuntary movements: Secondary | ICD-10-CM | POA: Diagnosis not present

## 2019-04-20 DIAGNOSIS — G911 Obstructive hydrocephalus: Secondary | ICD-10-CM | POA: Diagnosis not present

## 2019-04-20 DIAGNOSIS — R32 Unspecified urinary incontinence: Secondary | ICD-10-CM

## 2019-04-29 ENCOUNTER — Encounter (INDEPENDENT_AMBULATORY_CARE_PROVIDER_SITE_OTHER): Payer: Self-pay | Admitting: Pediatrics

## 2019-04-29 ENCOUNTER — Other Ambulatory Visit: Payer: Self-pay

## 2019-04-29 ENCOUNTER — Ambulatory Visit (INDEPENDENT_AMBULATORY_CARE_PROVIDER_SITE_OTHER): Payer: Medicaid Other | Admitting: Pediatrics

## 2019-04-29 DIAGNOSIS — G8110 Spastic hemiplegia affecting unspecified side: Secondary | ICD-10-CM
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
## 2019-04-29 NOTE — Patient Instructions (Signed)
Glad to see you today.  I am so happy that Scott Bean is doing well.  I wish him happy birthday tomorrow.  We will see you June 10, 2019.  I will try to get up with Dr. Marianna Payment to let him know that before 5 months the pump needs to be replaced.  Please let me know if there is anything I can do before you return.

## 2019-05-03 DIAGNOSIS — S06360A Traumatic hemorrhage of cerebrum, unspecified, without loss of consciousness, initial encounter: Secondary | ICD-10-CM | POA: Diagnosis not present

## 2019-05-03 DIAGNOSIS — S06360D Traumatic hemorrhage of cerebrum, unspecified, without loss of consciousness, subsequent encounter: Secondary | ICD-10-CM | POA: Diagnosis not present

## 2019-05-03 DIAGNOSIS — T85733A Infection and inflammatory reaction due to implanted electronic neurostimulator of spinal cord, electrode (lead), initial encounter: Secondary | ICD-10-CM | POA: Diagnosis not present

## 2019-05-03 DIAGNOSIS — G911 Obstructive hydrocephalus: Secondary | ICD-10-CM | POA: Diagnosis not present

## 2019-05-04 ENCOUNTER — Other Ambulatory Visit (INDEPENDENT_AMBULATORY_CARE_PROVIDER_SITE_OTHER): Payer: Self-pay | Admitting: Pediatrics

## 2019-05-04 DIAGNOSIS — G40309 Generalized idiopathic epilepsy and epileptic syndromes, not intractable, without status epilepticus: Secondary | ICD-10-CM

## 2019-05-09 DIAGNOSIS — S06360D Traumatic hemorrhage of cerebrum, unspecified, without loss of consciousness, subsequent encounter: Secondary | ICD-10-CM | POA: Diagnosis not present

## 2019-05-09 DIAGNOSIS — T85733A Infection and inflammatory reaction due to implanted electronic neurostimulator of spinal cord, electrode (lead), initial encounter: Secondary | ICD-10-CM | POA: Diagnosis not present

## 2019-05-09 DIAGNOSIS — S06360A Traumatic hemorrhage of cerebrum, unspecified, without loss of consciousness, initial encounter: Secondary | ICD-10-CM | POA: Diagnosis not present

## 2019-05-09 DIAGNOSIS — G911 Obstructive hydrocephalus: Secondary | ICD-10-CM | POA: Diagnosis not present

## 2019-05-13 DIAGNOSIS — S06360A Traumatic hemorrhage of cerebrum, unspecified, without loss of consciousness, initial encounter: Secondary | ICD-10-CM | POA: Diagnosis not present

## 2019-05-13 DIAGNOSIS — T85733A Infection and inflammatory reaction due to implanted electronic neurostimulator of spinal cord, electrode (lead), initial encounter: Secondary | ICD-10-CM | POA: Diagnosis not present

## 2019-05-13 DIAGNOSIS — S06360D Traumatic hemorrhage of cerebrum, unspecified, without loss of consciousness, subsequent encounter: Secondary | ICD-10-CM | POA: Diagnosis not present

## 2019-05-13 DIAGNOSIS — G911 Obstructive hydrocephalus: Secondary | ICD-10-CM | POA: Diagnosis not present

## 2019-06-05 DIAGNOSIS — Z931 Gastrostomy status: Secondary | ICD-10-CM | POA: Diagnosis not present

## 2019-06-05 DIAGNOSIS — Z79891 Long term (current) use of opiate analgesic: Secondary | ICD-10-CM | POA: Diagnosis not present

## 2019-06-05 DIAGNOSIS — R32 Unspecified urinary incontinence: Secondary | ICD-10-CM | POA: Diagnosis not present

## 2019-06-05 DIAGNOSIS — Z9181 History of falling: Secondary | ICD-10-CM | POA: Diagnosis not present

## 2019-06-05 DIAGNOSIS — R258 Other abnormal involuntary movements: Secondary | ICD-10-CM | POA: Diagnosis not present

## 2019-06-05 DIAGNOSIS — Z87820 Personal history of traumatic brain injury: Secondary | ICD-10-CM | POA: Diagnosis not present

## 2019-06-05 DIAGNOSIS — R569 Unspecified convulsions: Secondary | ICD-10-CM | POA: Diagnosis not present

## 2019-06-05 DIAGNOSIS — G911 Obstructive hydrocephalus: Secondary | ICD-10-CM | POA: Diagnosis not present

## 2019-06-05 DIAGNOSIS — Z79899 Other long term (current) drug therapy: Secondary | ICD-10-CM | POA: Diagnosis not present

## 2019-06-06 ENCOUNTER — Other Ambulatory Visit (INDEPENDENT_AMBULATORY_CARE_PROVIDER_SITE_OTHER): Payer: Self-pay | Admitting: Pediatrics

## 2019-06-06 DIAGNOSIS — G911 Obstructive hydrocephalus: Secondary | ICD-10-CM | POA: Diagnosis not present

## 2019-06-06 DIAGNOSIS — T85733A Infection and inflammatory reaction due to implanted electronic neurostimulator of spinal cord, electrode (lead), initial encounter: Secondary | ICD-10-CM | POA: Diagnosis not present

## 2019-06-06 DIAGNOSIS — G40309 Generalized idiopathic epilepsy and epileptic syndromes, not intractable, without status epilepticus: Secondary | ICD-10-CM

## 2019-06-06 DIAGNOSIS — S06360D Traumatic hemorrhage of cerebrum, unspecified, without loss of consciousness, subsequent encounter: Secondary | ICD-10-CM | POA: Diagnosis not present

## 2019-06-06 DIAGNOSIS — S06360A Traumatic hemorrhage of cerebrum, unspecified, without loss of consciousness, initial encounter: Secondary | ICD-10-CM | POA: Diagnosis not present

## 2019-06-10 ENCOUNTER — Ambulatory Visit (INDEPENDENT_AMBULATORY_CARE_PROVIDER_SITE_OTHER): Payer: Medicaid Other | Admitting: Pediatrics

## 2019-06-10 ENCOUNTER — Other Ambulatory Visit: Payer: Self-pay

## 2019-06-10 ENCOUNTER — Encounter (INDEPENDENT_AMBULATORY_CARE_PROVIDER_SITE_OTHER): Payer: Self-pay | Admitting: Pediatrics

## 2019-06-10 DIAGNOSIS — G8110 Spastic hemiplegia affecting unspecified side: Secondary | ICD-10-CM | POA: Diagnosis not present
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
## 2019-06-13 DIAGNOSIS — G911 Obstructive hydrocephalus: Secondary | ICD-10-CM | POA: Diagnosis not present

## 2019-06-13 DIAGNOSIS — S06360D Traumatic hemorrhage of cerebrum, unspecified, without loss of consciousness, subsequent encounter: Secondary | ICD-10-CM | POA: Diagnosis not present

## 2019-06-13 DIAGNOSIS — T85733A Infection and inflammatory reaction due to implanted electronic neurostimulator of spinal cord, electrode (lead), initial encounter: Secondary | ICD-10-CM | POA: Diagnosis not present

## 2019-06-13 DIAGNOSIS — S06360A Traumatic hemorrhage of cerebrum, unspecified, without loss of consciousness, initial encounter: Secondary | ICD-10-CM | POA: Diagnosis not present

## 2019-07-07 ENCOUNTER — Other Ambulatory Visit (INDEPENDENT_AMBULATORY_CARE_PROVIDER_SITE_OTHER): Payer: Self-pay | Admitting: Pediatrics

## 2019-07-07 DIAGNOSIS — T85733A Infection and inflammatory reaction due to implanted electronic neurostimulator of spinal cord, electrode (lead), initial encounter: Secondary | ICD-10-CM | POA: Diagnosis not present

## 2019-07-07 DIAGNOSIS — G911 Obstructive hydrocephalus: Secondary | ICD-10-CM | POA: Diagnosis not present

## 2019-07-07 DIAGNOSIS — S06360A Traumatic hemorrhage of cerebrum, unspecified, without loss of consciousness, initial encounter: Secondary | ICD-10-CM | POA: Diagnosis not present

## 2019-07-07 DIAGNOSIS — G40309 Generalized idiopathic epilepsy and epileptic syndromes, not intractable, without status epilepticus: Secondary | ICD-10-CM

## 2019-07-07 DIAGNOSIS — S06360D Traumatic hemorrhage of cerebrum, unspecified, without loss of consciousness, subsequent encounter: Secondary | ICD-10-CM | POA: Diagnosis not present

## 2019-07-13 DIAGNOSIS — T85733A Infection and inflammatory reaction due to implanted electronic neurostimulator of spinal cord, electrode (lead), initial encounter: Secondary | ICD-10-CM | POA: Diagnosis not present

## 2019-07-13 DIAGNOSIS — S06360D Traumatic hemorrhage of cerebrum, unspecified, without loss of consciousness, subsequent encounter: Secondary | ICD-10-CM | POA: Diagnosis not present

## 2019-07-13 DIAGNOSIS — G911 Obstructive hydrocephalus: Secondary | ICD-10-CM | POA: Diagnosis not present

## 2019-07-13 DIAGNOSIS — S06360A Traumatic hemorrhage of cerebrum, unspecified, without loss of consciousness, initial encounter: Secondary | ICD-10-CM | POA: Diagnosis not present

## 2019-07-19 ENCOUNTER — Encounter (INDEPENDENT_AMBULATORY_CARE_PROVIDER_SITE_OTHER): Payer: Self-pay | Admitting: Pediatrics

## 2019-07-19 ENCOUNTER — Other Ambulatory Visit: Payer: Self-pay

## 2019-07-19 ENCOUNTER — Ambulatory Visit (INDEPENDENT_AMBULATORY_CARE_PROVIDER_SITE_OTHER): Payer: Medicaid Other | Admitting: Pediatrics

## 2019-07-19 DIAGNOSIS — G8110 Spastic hemiplegia affecting unspecified side: Secondary | ICD-10-CM

## 2019-07-19 DIAGNOSIS — G40309 Generalized idiopathic epilepsy and epileptic syndromes, not intractable, without status epilepticus: Secondary | ICD-10-CM

## 2019-07-19 NOTE — Patient Instructions (Signed)
I will try to reach Dr. Samson Frederic today.  We have an estimated 2 months before the pump needs to be replaced.
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
## 2019-07-29 ENCOUNTER — Telehealth (INDEPENDENT_AMBULATORY_CARE_PROVIDER_SITE_OTHER): Payer: Self-pay | Admitting: Pediatrics

## 2019-07-29 NOTE — Telephone Encounter (Signed)
Scott Bean has an appointment with Dr. Fredric Dine on Tuesday and will have his pump replaced sometime in May.  I talked with mom.

## 2019-08-02 DIAGNOSIS — Z978 Presence of other specified devices: Secondary | ICD-10-CM | POA: Insufficient documentation

## 2019-08-02 DIAGNOSIS — T85610A Breakdown (mechanical) of epidural and subdural infusion catheter, initial encounter: Secondary | ICD-10-CM | POA: Diagnosis not present

## 2019-08-02 DIAGNOSIS — Z4549 Encounter for adjustment and management of other implanted nervous system device: Secondary | ICD-10-CM | POA: Diagnosis not present

## 2019-08-04 DIAGNOSIS — R569 Unspecified convulsions: Secondary | ICD-10-CM | POA: Diagnosis not present

## 2019-08-04 DIAGNOSIS — Z79891 Long term (current) use of opiate analgesic: Secondary | ICD-10-CM | POA: Diagnosis not present

## 2019-08-04 DIAGNOSIS — Z9181 History of falling: Secondary | ICD-10-CM | POA: Diagnosis not present

## 2019-08-04 DIAGNOSIS — Z931 Gastrostomy status: Secondary | ICD-10-CM | POA: Diagnosis not present

## 2019-08-04 DIAGNOSIS — Z79899 Other long term (current) drug therapy: Secondary | ICD-10-CM | POA: Diagnosis not present

## 2019-08-04 DIAGNOSIS — R258 Other abnormal involuntary movements: Secondary | ICD-10-CM | POA: Diagnosis not present

## 2019-08-04 DIAGNOSIS — R32 Unspecified urinary incontinence: Secondary | ICD-10-CM | POA: Diagnosis not present

## 2019-08-04 DIAGNOSIS — Z87820 Personal history of traumatic brain injury: Secondary | ICD-10-CM | POA: Diagnosis not present

## 2019-08-04 DIAGNOSIS — G911 Obstructive hydrocephalus: Secondary | ICD-10-CM | POA: Diagnosis not present

## 2019-08-06 DIAGNOSIS — G911 Obstructive hydrocephalus: Secondary | ICD-10-CM | POA: Diagnosis not present

## 2019-08-06 DIAGNOSIS — S06360A Traumatic hemorrhage of cerebrum, unspecified, without loss of consciousness, initial encounter: Secondary | ICD-10-CM | POA: Diagnosis not present

## 2019-08-06 DIAGNOSIS — S06360D Traumatic hemorrhage of cerebrum, unspecified, without loss of consciousness, subsequent encounter: Secondary | ICD-10-CM | POA: Diagnosis not present

## 2019-08-06 DIAGNOSIS — T85733A Infection and inflammatory reaction due to implanted electronic neurostimulator of spinal cord, electrode (lead), initial encounter: Secondary | ICD-10-CM | POA: Diagnosis not present

## 2019-08-08 ENCOUNTER — Other Ambulatory Visit (INDEPENDENT_AMBULATORY_CARE_PROVIDER_SITE_OTHER): Payer: Self-pay | Admitting: Pediatrics

## 2019-08-08 DIAGNOSIS — G40309 Generalized idiopathic epilepsy and epileptic syndromes, not intractable, without status epilepticus: Secondary | ICD-10-CM

## 2019-08-10 ENCOUNTER — Other Ambulatory Visit: Payer: Self-pay

## 2019-08-10 ENCOUNTER — Ambulatory Visit (INDEPENDENT_AMBULATORY_CARE_PROVIDER_SITE_OTHER): Payer: Medicaid Other

## 2019-08-10 DIAGNOSIS — R258 Other abnormal involuntary movements: Secondary | ICD-10-CM

## 2019-08-10 DIAGNOSIS — Z87820 Personal history of traumatic brain injury: Secondary | ICD-10-CM

## 2019-08-10 DIAGNOSIS — Z79899 Other long term (current) drug therapy: Secondary | ICD-10-CM

## 2019-08-10 DIAGNOSIS — G911 Obstructive hydrocephalus: Secondary | ICD-10-CM

## 2019-08-10 DIAGNOSIS — R569 Unspecified convulsions: Secondary | ICD-10-CM

## 2019-08-10 DIAGNOSIS — Z79891 Long term (current) use of opiate analgesic: Secondary | ICD-10-CM

## 2019-08-10 DIAGNOSIS — R32 Unspecified urinary incontinence: Secondary | ICD-10-CM | POA: Diagnosis not present

## 2019-08-10 DIAGNOSIS — Z931 Gastrostomy status: Secondary | ICD-10-CM | POA: Diagnosis not present

## 2019-08-10 DIAGNOSIS — Z9181 History of falling: Secondary | ICD-10-CM

## 2019-08-11 ENCOUNTER — Telehealth (INDEPENDENT_AMBULATORY_CARE_PROVIDER_SITE_OTHER): Payer: Self-pay | Admitting: Pediatrics

## 2019-08-11 NOTE — Telephone Encounter (Signed)
Who's calling (name and relationship to patient) : [name] (mom)  Best contact number: [phone]  Provider they see: Dr. Sharene Skeans  Reason for call: Mom states that the earliest surgery appointment for patient's Balcofen pump is 5/26. Patient has appointment with Dr. Sharene Skeans on 5/25. She wants to know if that surgery date is okay. Requests call back.    Call ID:      PRESCRIPTION REFILL ONLY  Name of prescription:  Pharmacy:

## 2019-08-11 NOTE — Telephone Encounter (Signed)
Yes that will be fine.  We will fill the pump and then when they replace it, they will empty it and refill the new one.  I will check with [facility] to make certain that they are comfortable with that plan.  I will call mother tomorrow.

## 2019-08-12 DIAGNOSIS — S06360D Traumatic hemorrhage of cerebrum, unspecified, without loss of consciousness, subsequent encounter: Secondary | ICD-10-CM | POA: Diagnosis not present

## 2019-08-12 DIAGNOSIS — G911 Obstructive hydrocephalus: Secondary | ICD-10-CM | POA: Diagnosis not present

## 2019-08-12 DIAGNOSIS — S06360A Traumatic hemorrhage of cerebrum, unspecified, without loss of consciousness, initial encounter: Secondary | ICD-10-CM | POA: Diagnosis not present

## 2019-08-12 DIAGNOSIS — T85733A Infection and inflammatory reaction due to implanted electronic neurostimulator of spinal cord, electrode (lead), initial encounter: Secondary | ICD-10-CM | POA: Diagnosis not present

## 2019-08-12 NOTE — Telephone Encounter (Signed)
I called mom and she confirmed the new appointment.

## 2019-08-12 NOTE — Telephone Encounter (Addendum)
There was a cancellation and he is now set up for the 12th.  According to the staff, mother knows about this.

## 2019-08-15 NOTE — Telephone Encounter (Signed)
Mom says son is having surgery on the 12th and she was told that she could bring him into our office 10-14 days after the surgery for staple removal. Please call her back to confirm. Call back number is [phone].

## 2019-08-17 DIAGNOSIS — Z978 Presence of other specified devices: Secondary | ICD-10-CM | POA: Diagnosis not present

## 2019-08-17 DIAGNOSIS — S069X9S Unspecified intracranial injury with loss of consciousness of unspecified duration, sequela: Secondary | ICD-10-CM | POA: Diagnosis not present

## 2019-08-17 DIAGNOSIS — Z4549 Encounter for adjustment and management of other implanted nervous system device: Secondary | ICD-10-CM | POA: Diagnosis not present

## 2019-08-17 DIAGNOSIS — R252 Cramp and spasm: Secondary | ICD-10-CM | POA: Diagnosis not present

## 2019-08-17 DIAGNOSIS — Z451 Encounter for adjustment and management of infusion pump: Secondary | ICD-10-CM | POA: Diagnosis not present

## 2019-08-30 ENCOUNTER — Encounter (INDEPENDENT_AMBULATORY_CARE_PROVIDER_SITE_OTHER): Payer: Medicaid Other | Admitting: Pediatrics

## 2019-09-02 ENCOUNTER — Other Ambulatory Visit: Payer: Self-pay

## 2019-09-02 ENCOUNTER — Encounter (HOSPITAL_COMMUNITY): Payer: Self-pay | Admitting: *Deleted

## 2019-09-02 ENCOUNTER — Emergency Department (HOSPITAL_COMMUNITY)
Admission: EM | Admit: 2019-09-02 | Discharge: 2019-09-02 | Disposition: A | Discharge status: Home / Self Care | Payer: Medicaid Other | Attending: Emergency Medicine | Admitting: Emergency Medicine

## 2019-09-02 DIAGNOSIS — K9421 Gastrostomy hemorrhage: Secondary | ICD-10-CM | POA: Insufficient documentation

## 2019-09-02 DIAGNOSIS — Z79899 Other long term (current) drug therapy: Secondary | ICD-10-CM | POA: Insufficient documentation

## 2019-09-02 DIAGNOSIS — R140 Abdominal distension (gaseous): Secondary | ICD-10-CM | POA: Diagnosis not present

## 2019-09-02 DIAGNOSIS — K9423 Gastrostomy malfunction: Secondary | ICD-10-CM | POA: Diagnosis not present

## 2019-09-02 DIAGNOSIS — R404 Transient alteration of awareness: Secondary | ICD-10-CM | POA: Diagnosis not present

## 2019-09-02 DIAGNOSIS — Z931 Gastrostomy status: Secondary | ICD-10-CM | POA: Diagnosis not present

## 2019-09-02 DIAGNOSIS — R58 Hemorrhage, not elsewhere classified: Secondary | ICD-10-CM | POA: Diagnosis not present

## 2019-09-02 LAB — CBC
HCT: 46.8 % (ref 39.0–52.0)
Hemoglobin: 15.2 g/dL (ref 13.0–17.0)
MCH: 30.9 pg (ref 26.0–34.0)
MCHC: 32.5 g/dL (ref 30.0–36.0)
MCV: 95.1 fL (ref 80.0–100.0)
Platelets: 251 10*3/uL (ref 150–400)
RBC: 4.92 MIL/uL (ref 4.22–5.81)
RDW: 13.7 % (ref 11.5–15.5)
WBC: 7.3 10*3/uL (ref 4.0–10.5)
nRBC: 0 % (ref 0.0–0.2)

## 2019-09-02 LAB — BASIC METABOLIC PANEL
Anion gap: 9 (ref 5–15)
BUN: 9 mg/dL (ref 6–20)
CO2: 28 mmol/L (ref 22–32)
Calcium: 8.8 mg/dL — ABNORMAL LOW (ref 8.9–10.3)
Chloride: 100 mmol/L (ref 98–111)
Creatinine, Ser: 0.4 mg/dL — ABNORMAL LOW (ref 0.61–1.24)
GFR calc Af Amer: >60 mL/min (ref >60)
GFR calc non Af Amer: >60 mL/min (ref >60)
Glucose, Bld: 97 mg/dL (ref 70–99)
Potassium: 4 mmol/L (ref 3.5–5.1)
Sodium: 137 mmol/L (ref 135–145)

## 2019-09-02 NOTE — ED Notes (Signed)
Pt mother verbalized understanding of discharge instructions and did not have any questions or concerns. E-signature pad not working at time of discharge. nad noted.

## 2019-09-02 NOTE — ED Provider Notes (Signed)
[facility] EMERGENCY DEPARTMENT Provider Note   CSN: [id number] Arrival date & time: 09/02/19  1023     History Chief Complaint  Patient presents with  . Bleeding/Bruising    Scott Bean is a 29 y.o. male.  Patient with history of craniotomy, shunt, baclofen pump, bacteremia presents with bleeding around his PEG tube that started yesterday.  No vomiting fever or chills.  Patient is on blood thinning medications.  Patient has intermittent abdominal distention with constipation.  Patient had recent baclofen pump surgery that went well and the wound is healing well.  Patient has had a binder to help with pressure and healing which has been putting pressure on the G-tube.        Past Medical History:  Diagnosis Date  . Bacteremia 12/10/2011  . Cellulitis 12/27/2012  . 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 PNA (pneumonia) 12/09/2011    Patient Active Problem List   Diagnosis Date Noted  . Transient alteration of awareness 03/18/2019  . Spastic hemiplegia affecting nondominant side (HCC) 08/10/2012  . Spastic hemiplegia affecting dominant side (HCC) 08/10/2012  . 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 Encounter for long-term (current) use of other medications 08/10/2012  . H/O gastrointestinal disease 05/15/2011  . Allergic state 05/15/2011  . Obstructive hydrocephalus (HCC) 05/14/2011  . Closed skull fracture with intracranial injury, with loss of consciousness (HCC) 05/14/2011  . Cognitive decline 05/14/2011  . Hydrocephalus (HCC) 05/14/2011  . Traumatic brain injury with loss of consciousness (HCC) 05/14/2011    Past Surgical History:  Procedure Laterality Date  . decompressive craniotomy    . implanation of intrathecal baclofen pump    . PEG TUBE PLACEMENT    . reimplantation of intrathecal baclofen pump    . VENTRICULOPERITONEAL SHUNT         Family History  Problem Relation Age of  Onset  . Heart Problems Maternal Grandfather        Died at 43  . Lung cancer Maternal Grandmother        Died at 36  . Liver cancer Maternal Grandmother     Social History   Tobacco Use  . Smoking status: Never Smoker  . Smokeless tobacco: Never Used  Substance Use Topics  . Alcohol use: No  . Drug use: No    Home Medications Prior to Admission medications   Medication Sig Start Date End Date Taking? Authorizing Provider  carbamazepine (TEGRETOL) 100 MG chewable tablet CRUSH AND GIVE AS DIRECTED. 3.5 TABLETS IN THE MORNING AND BEDTIME, 3 AT NOON AND DINNER 02/28/19   Deetta Perla, MD  cetirizine HCl (ZYRTEC) 5 MG/5ML SYRP 1 mg. 1 mg by Tube route daily as needed.    [provider]  clotrimazole-betamethasone (LOTRISONE) cream APPLY TO AFFECTED AREA TWICE DAILY 04/17/14   [provider]  fluticasone (FLONASE) 50 MCG/ACT nasal spray Place 2 sprays into both nostrils daily. 11/30/18   Raliegh Ip, DO  levETIRAcetam (KEPPRA) 100 MG/ML solution TAKE 2.5 TEASPOONS TWICE A DAY 08/08/19   Deetta Perla, MD  TEGRETOL 100 MG/5ML suspension TAKE 16.5 ML EVERY 6 HOURS 02/28/19   Deetta Perla, MD    Allergies    Patient has no known allergies.  Review of Systems   Review of Systems  Unable to perform ROS: Patient nonverbal    Physical Exam Updated Vital Signs BP (!) 134/106   Pulse 70   Temp 97.6 F (36.4  C) (Axillary)   Resp 17   Ht 6\' 7"  (2.007 m)   Wt 88 kg   SpO2 100%   BMI 21.85 kg/m   Physical Exam Vitals and nursing note reviewed.  HENT:     Head: Atraumatic.     Nose: Nose normal. No congestion.     Mouth/Throat:     Mouth: Mucous membranes are moist.  Eyes:     General:        Right eye: No discharge.        Left eye: No discharge.  Cardiovascular:     Rate and Rhythm: Normal rate.  Abdominal:     General: There is distension.     Tenderness: There is no abdominal tenderness.     Comments: Left lower abd post  op wound healing well, no external sign of infection, non tender No active bleeding external G tube, mild dried blood surrounding, no laceration noted, G tube in place  Musculoskeletal:     Cervical back: Neck supple.  Neurological:     Mental Status: He is alert.     ED Results / Procedures / Treatments   Labs (all labs ordered are listed, but only abnormal results are displayed) Labs Reviewed  CBC  BASIC METABOLIC PANEL    EKG None  Radiology No results found.  Procedures Procedures (including critical care time)  Medications Ordered in ED Medications - No data to display  ED Course  I have reviewed the triage vital signs and the nursing notes.  Pertinent labs & imaging results that were available during my care of the patient were reviewed by me and considered in my medical decision making (see chart for details).    MDM Rules/Calculators/A&P                      Patient presents for bleeding around the G-tube site.  Per his caregiver no other concerns at this time.  Discussed this is likely from the pressure from the binder.  Patient's vital signs unremarkable reviewed.  Discussed plan for basic blood work to check hemoglobin and platelets. Feeding tube working. Patient has outpatient follow-up.  CBC reviewed hemoglobin and platelets normal.  Patient stable for close outpatient follow-up. Final Clinical Impression(s) / ED Diagnoses Final diagnoses:  Bleeding from gastrostomy tube site [facility])    Rx / DC Orders ED Discharge Orders    None       Elnora Morrison, MD 09/02/19 1255

## 2019-09-02 NOTE — Discharge Instructions (Addendum)
Follow-up with either your primary doctor or the doctor who placed your G-tube. Minimize pressure at that site to allow for healing of the bleeding. Return for new or worsening symptoms.

## 2019-09-02 NOTE — ED Triage Notes (Signed)
Pt brought in by [facility] for c/o bleeding from his peg tube; the bleeding started yesterday after sitting straight up and getting his haircut

## 2019-09-03 ENCOUNTER — Other Ambulatory Visit (INDEPENDENT_AMBULATORY_CARE_PROVIDER_SITE_OTHER): Payer: Self-pay | Admitting: Pediatrics

## 2019-09-03 DIAGNOSIS — G40309 Generalized idiopathic epilepsy and epileptic syndromes, not intractable, without status epilepticus: Secondary | ICD-10-CM

## 2019-09-05 DIAGNOSIS — K9420 Gastrostomy complication, unspecified: Secondary | ICD-10-CM | POA: Diagnosis not present

## 2019-09-06 ENCOUNTER — Ambulatory Visit (INDEPENDENT_AMBULATORY_CARE_PROVIDER_SITE_OTHER): Payer: Medicaid Other | Admitting: *Deleted

## 2019-09-06 ENCOUNTER — Telehealth: Payer: Self-pay | Admitting: Family Medicine

## 2019-09-06 ENCOUNTER — Other Ambulatory Visit: Payer: Self-pay | Admitting: Family Medicine

## 2019-09-06 ENCOUNTER — Other Ambulatory Visit: Payer: Self-pay

## 2019-09-06 VITALS — BP 140/80

## 2019-09-06 DIAGNOSIS — G911 Obstructive hydrocephalus: Secondary | ICD-10-CM | POA: Diagnosis not present

## 2019-09-06 DIAGNOSIS — S06360A Traumatic hemorrhage of cerebrum, unspecified, without loss of consciousness, initial encounter: Secondary | ICD-10-CM | POA: Diagnosis not present

## 2019-09-06 DIAGNOSIS — Z0130 Encounter for examination of blood pressure without abnormal findings: Secondary | ICD-10-CM

## 2019-09-06 DIAGNOSIS — T85733A Infection and inflammatory reaction due to implanted electronic neurostimulator of spinal cord, electrode (lead), initial encounter: Secondary | ICD-10-CM | POA: Diagnosis not present

## 2019-09-06 DIAGNOSIS — S06360D Traumatic hemorrhage of cerebrum, unspecified, without loss of consciousness, subsequent encounter: Secondary | ICD-10-CM | POA: Diagnosis not present

## 2019-09-06 NOTE — Telephone Encounter (Signed)
Nurse visit scheduled.

## 2019-09-06 NOTE — Telephone Encounter (Signed)
See if we can get a nurse to check BP here in office.  Have her bring in her cuff.  Also, is he experiencing pain?  This can raise BP

## 2019-09-06 NOTE — Progress Notes (Signed)
Patient in today for blood pressure check. 140/80, provider notified and no changes made.

## 2019-09-06 NOTE — Telephone Encounter (Signed)
Patient had baclofen pump surgery to replace a week ago. Mother had to take him to the ED twice this weekend on at [facility] the next [facility]. Bleeding around feeding tube is what was going on and they told her it was a scratch from the surgery more than likely. When there BP was up and mother thought it might just be a fluke but got him home and checked today it was 149/104 P69. Mother would like for Dr. Nadine Counts to read ER notes and see what she would like to do. Called Surgeon office has follow up with them on 10/01/19. Please advise.

## 2019-09-06 NOTE — Telephone Encounter (Signed)
Please send to the pharmacy °

## 2019-09-13 DIAGNOSIS — Z978 Presence of other specified devices: Secondary | ICD-10-CM | POA: Diagnosis not present

## 2019-09-13 DIAGNOSIS — Z79899 Other long term (current) drug therapy: Secondary | ICD-10-CM | POA: Diagnosis not present

## 2019-09-13 DIAGNOSIS — K9421 Gastrostomy hemorrhage: Secondary | ICD-10-CM | POA: Diagnosis not present

## 2019-09-13 DIAGNOSIS — Z87820 Personal history of traumatic brain injury: Secondary | ICD-10-CM | POA: Diagnosis not present

## 2019-09-13 DIAGNOSIS — R140 Abdominal distension (gaseous): Secondary | ICD-10-CM | POA: Diagnosis not present

## 2019-09-15 DIAGNOSIS — S06360D Traumatic hemorrhage of cerebrum, unspecified, without loss of consciousness, subsequent encounter: Secondary | ICD-10-CM | POA: Diagnosis not present

## 2019-09-15 DIAGNOSIS — T85733A Infection and inflammatory reaction due to implanted electronic neurostimulator of spinal cord, electrode (lead), initial encounter: Secondary | ICD-10-CM | POA: Diagnosis not present

## 2019-09-15 DIAGNOSIS — G911 Obstructive hydrocephalus: Secondary | ICD-10-CM | POA: Diagnosis not present

## 2019-09-15 DIAGNOSIS — S06360A Traumatic hemorrhage of cerebrum, unspecified, without loss of consciousness, initial encounter: Secondary | ICD-10-CM | POA: Diagnosis not present

## 2019-09-20 ENCOUNTER — Other Ambulatory Visit: Payer: Self-pay

## 2019-09-20 ENCOUNTER — Ambulatory Visit (INDEPENDENT_AMBULATORY_CARE_PROVIDER_SITE_OTHER): Payer: Medicaid Other | Admitting: Pediatrics

## 2019-09-20 ENCOUNTER — Ambulatory Visit
Admission: RE | Admit: 2019-09-20 | Discharge: 2019-09-20 | Disposition: A | Discharge status: Home / Self Care | Payer: Medicaid Other | Source: Ambulatory Visit | Attending: Pediatrics | Admitting: Pediatrics

## 2019-09-20 ENCOUNTER — Encounter (INDEPENDENT_AMBULATORY_CARE_PROVIDER_SITE_OTHER): Payer: Self-pay | Admitting: Pediatrics

## 2019-09-20 DIAGNOSIS — G40309 Generalized idiopathic epilepsy and epileptic syndromes, not intractable, without status epilepticus: Secondary | ICD-10-CM

## 2019-09-20 DIAGNOSIS — Z978 Presence of other specified devices: Secondary | ICD-10-CM

## 2019-09-20 DIAGNOSIS — G8110 Spastic hemiplegia affecting unspecified side: Secondary | ICD-10-CM

## 2019-09-20 DIAGNOSIS — Z4682 Encounter for fitting and adjustment of non-vascular catheter: Secondary | ICD-10-CM | POA: Diagnosis not present

## 2019-09-20 DIAGNOSIS — G911 Obstructive hydrocephalus: Secondary | ICD-10-CM | POA: Diagnosis not present

## 2019-09-20 DIAGNOSIS — R140 Abdominal distension (gaseous): Secondary | ICD-10-CM | POA: Diagnosis not present

## 2019-09-20 DIAGNOSIS — S069X9S Unspecified intracranial injury with loss of consciousness of unspecified duration, sequela: Secondary | ICD-10-CM
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
## 2019-10-04 ENCOUNTER — Other Ambulatory Visit (INDEPENDENT_AMBULATORY_CARE_PROVIDER_SITE_OTHER): Payer: Self-pay | Admitting: Pediatrics

## 2019-10-04 DIAGNOSIS — G40309 Generalized idiopathic epilepsy and epileptic syndromes, not intractable, without status epilepticus: Secondary | ICD-10-CM

## 2019-10-05 DIAGNOSIS — R32 Unspecified urinary incontinence: Secondary | ICD-10-CM | POA: Diagnosis not present

## 2019-10-05 DIAGNOSIS — T85733A Infection and inflammatory reaction due to implanted electronic neurostimulator of spinal cord, electrode (lead), initial encounter: Secondary | ICD-10-CM | POA: Diagnosis not present

## 2019-10-05 DIAGNOSIS — G911 Obstructive hydrocephalus: Secondary | ICD-10-CM | POA: Diagnosis not present

## 2019-10-05 DIAGNOSIS — S06360A Traumatic hemorrhage of cerebrum, unspecified, without loss of consciousness, initial encounter: Secondary | ICD-10-CM | POA: Diagnosis not present

## 2019-10-06 DIAGNOSIS — S06360D Traumatic hemorrhage of cerebrum, unspecified, without loss of consciousness, subsequent encounter: Secondary | ICD-10-CM | POA: Diagnosis not present

## 2019-10-06 DIAGNOSIS — S06360A Traumatic hemorrhage of cerebrum, unspecified, without loss of consciousness, initial encounter: Secondary | ICD-10-CM | POA: Diagnosis not present

## 2019-10-06 DIAGNOSIS — T85733A Infection and inflammatory reaction due to implanted electronic neurostimulator of spinal cord, electrode (lead), initial encounter: Secondary | ICD-10-CM | POA: Diagnosis not present

## 2019-10-06 DIAGNOSIS — G911 Obstructive hydrocephalus: Secondary | ICD-10-CM | POA: Diagnosis not present

## 2019-10-12 ENCOUNTER — Ambulatory Visit (INDEPENDENT_AMBULATORY_CARE_PROVIDER_SITE_OTHER): Payer: Medicaid Other | Admitting: Family Medicine

## 2019-10-12 ENCOUNTER — Encounter: Payer: Self-pay | Admitting: Family Medicine

## 2019-10-12 ENCOUNTER — Other Ambulatory Visit: Payer: Self-pay

## 2019-10-12 ENCOUNTER — Ambulatory Visit: Payer: Medicaid Other | Admitting: Family Medicine

## 2019-10-12 VITALS — BP 124/80 | HR 72 | Temp 97.0°F | Ht 79.0 in

## 2019-10-12 DIAGNOSIS — G8110 Spastic hemiplegia affecting unspecified side: Secondary | ICD-10-CM

## 2019-10-12 DIAGNOSIS — L309 Dermatitis, unspecified: Secondary | ICD-10-CM | POA: Diagnosis not present

## 2019-10-12 DIAGNOSIS — G40309 Generalized idiopathic epilepsy and epileptic syndromes, not intractable, without status epilepticus: Secondary | ICD-10-CM

## 2019-10-12 DIAGNOSIS — R58 Hemorrhage, not elsewhere classified: Secondary | ICD-10-CM

## 2019-10-12 LAB — HEMOGLOBIN, FINGERSTICK: Hemoglobin: 13.7 g/dL (ref 12.6–17.7)
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
## 2019-10-12 NOTE — Patient Instructions (Signed)
Try something that contains zinc applied to the chapped areas around the tube.  Let me know if this worsens for any reason.

## 2019-10-25 DIAGNOSIS — T85733A Infection and inflammatory reaction due to implanted electronic neurostimulator of spinal cord, electrode (lead), initial encounter: Secondary | ICD-10-CM | POA: Diagnosis not present

## 2019-10-25 DIAGNOSIS — S06360D Traumatic hemorrhage of cerebrum, unspecified, without loss of consciousness, subsequent encounter: Secondary | ICD-10-CM | POA: Diagnosis not present

## 2019-10-25 DIAGNOSIS — G911 Obstructive hydrocephalus: Secondary | ICD-10-CM | POA: Diagnosis not present

## 2019-10-25 DIAGNOSIS — S06360A Traumatic hemorrhage of cerebrum, unspecified, without loss of consciousness, initial encounter: Secondary | ICD-10-CM | POA: Diagnosis not present

## 2019-10-26 ENCOUNTER — Telehealth: Payer: Self-pay | Admitting: Family Medicine

## 2019-10-26 NOTE — Telephone Encounter (Signed)
Pt needs F2F or video visit for documentation of need for mattress pad. Appt made for 11/09/19

## 2019-10-26 NOTE — Telephone Encounter (Signed)
Can I not use the 7/7 visit?  We talked about pressure sores/ paraplegia, etc

## 2019-10-27 NOTE — Telephone Encounter (Signed)
Given to [place].  Note updated to reflect needs.  Told mother we would call back if her appt not needed, which hopefully it won't be as 7/7 visit should qualify

## 2019-10-27 NOTE — Addendum Note (Signed)
Addended by: Raliegh Ip on: 10/27/2019 04:01 PM   Modules accepted: Orders

## 2019-10-27 NOTE — Telephone Encounter (Signed)
You can if there is enough documentation for this type of mattress need

## 2019-10-28 NOTE — Telephone Encounter (Signed)
Order, notes & demographics printed and faxed to [facility]

## 2019-10-31 ENCOUNTER — Ambulatory Visit (INDEPENDENT_AMBULATORY_CARE_PROVIDER_SITE_OTHER): Payer: Medicaid Other | Admitting: Pediatrics

## 2019-10-31 ENCOUNTER — Encounter (INDEPENDENT_AMBULATORY_CARE_PROVIDER_SITE_OTHER): Payer: Self-pay | Admitting: Pediatrics

## 2019-10-31 ENCOUNTER — Other Ambulatory Visit: Payer: Self-pay

## 2019-10-31 DIAGNOSIS — G8110 Spastic hemiplegia affecting unspecified side: Secondary | ICD-10-CM

## 2019-10-31 NOTE — Patient Instructions (Signed)
See you 12/08/19
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
## 2019-11-02 ENCOUNTER — Other Ambulatory Visit (INDEPENDENT_AMBULATORY_CARE_PROVIDER_SITE_OTHER): Payer: Self-pay | Admitting: Pediatrics

## 2019-11-02 DIAGNOSIS — G40309 Generalized idiopathic epilepsy and epileptic syndromes, not intractable, without status epilepticus: Secondary | ICD-10-CM

## 2019-11-08 ENCOUNTER — Telehealth: Payer: Self-pay | Admitting: *Deleted

## 2019-11-08 NOTE — Telephone Encounter (Signed)
Mattress was brought out today from [facility]. It is the wrong mattress, Drinda Butts was told we need to order a acupressure pump with pad (APP)

## 2019-11-08 NOTE — Telephone Encounter (Signed)
Hand written RX given to [facility]

## 2019-11-09 ENCOUNTER — Telehealth: Payer: Medicaid Other | Admitting: Family Medicine

## 2019-11-14 DIAGNOSIS — G8110 Spastic hemiplegia affecting unspecified side: Secondary | ICD-10-CM | POA: Diagnosis not present

## 2019-11-24 DIAGNOSIS — T85733A Infection and inflammatory reaction due to implanted electronic neurostimulator of spinal cord, electrode (lead), initial encounter: Secondary | ICD-10-CM | POA: Diagnosis not present

## 2019-11-24 DIAGNOSIS — S06360D Traumatic hemorrhage of cerebrum, unspecified, without loss of consciousness, subsequent encounter: Secondary | ICD-10-CM | POA: Diagnosis not present

## 2019-11-24 DIAGNOSIS — S06360A Traumatic hemorrhage of cerebrum, unspecified, without loss of consciousness, initial encounter: Secondary | ICD-10-CM | POA: Diagnosis not present

## 2019-11-24 DIAGNOSIS — G911 Obstructive hydrocephalus: Secondary | ICD-10-CM | POA: Diagnosis not present

## 2019-11-28 ENCOUNTER — Other Ambulatory Visit: Payer: Self-pay | Admitting: Family Medicine

## 2019-11-30 ENCOUNTER — Other Ambulatory Visit: Payer: Self-pay | Admitting: *Deleted

## 2019-11-30 MED ORDER — CLOTRIMAZOLE-BETAMETHASONE 1-0.05 % EX CREA: TOPICAL_CREAM | Freq: Two times a day (BID) | CUTANEOUS | 1 refills | Status: DC | PRN

## 2019-12-01 ENCOUNTER — Other Ambulatory Visit (INDEPENDENT_AMBULATORY_CARE_PROVIDER_SITE_OTHER): Payer: Self-pay | Admitting: Pediatrics

## 2019-12-01 DIAGNOSIS — G40309 Generalized idiopathic epilepsy and epileptic syndromes, not intractable, without status epilepticus: Secondary | ICD-10-CM

## 2019-12-01 NOTE — Telephone Encounter (Signed)
Please send to the pharmacy °

## 2019-12-08 ENCOUNTER — Ambulatory Visit (INDEPENDENT_AMBULATORY_CARE_PROVIDER_SITE_OTHER): Payer: Medicaid Other | Admitting: Pediatrics

## 2019-12-08 ENCOUNTER — Encounter (INDEPENDENT_AMBULATORY_CARE_PROVIDER_SITE_OTHER): Payer: Self-pay | Admitting: Pediatrics

## 2019-12-08 ENCOUNTER — Other Ambulatory Visit: Payer: Self-pay

## 2019-12-08 DIAGNOSIS — G8110 Spastic hemiplegia affecting unspecified side: Secondary | ICD-10-CM | POA: Diagnosis not present
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
## 2019-12-08 NOTE — Patient Instructions (Signed)
It was good to see you today.  We will see you on January 17, 2020 to empty refill and reprogram your pump.

## 2019-12-28 DIAGNOSIS — S06360A Traumatic hemorrhage of cerebrum, unspecified, without loss of consciousness, initial encounter: Secondary | ICD-10-CM | POA: Diagnosis not present

## 2019-12-28 DIAGNOSIS — S06360D Traumatic hemorrhage of cerebrum, unspecified, without loss of consciousness, subsequent encounter: Secondary | ICD-10-CM | POA: Diagnosis not present

## 2019-12-28 DIAGNOSIS — G911 Obstructive hydrocephalus: Secondary | ICD-10-CM | POA: Diagnosis not present

## 2019-12-28 DIAGNOSIS — T85733A Infection and inflammatory reaction due to implanted electronic neurostimulator of spinal cord, electrode (lead), initial encounter: Secondary | ICD-10-CM | POA: Diagnosis not present

## 2019-12-29 ENCOUNTER — Other Ambulatory Visit (INDEPENDENT_AMBULATORY_CARE_PROVIDER_SITE_OTHER): Payer: Self-pay | Admitting: Pediatrics

## 2019-12-29 DIAGNOSIS — G40309 Generalized idiopathic epilepsy and epileptic syndromes, not intractable, without status epilepticus: Secondary | ICD-10-CM

## 2019-12-30 NOTE — Telephone Encounter (Signed)
Please send to the pharmacy °

## 2020-01-06 DIAGNOSIS — G8110 Spastic hemiplegia affecting unspecified side: Secondary | ICD-10-CM | POA: Diagnosis not present

## 2020-01-17 ENCOUNTER — Encounter (INDEPENDENT_AMBULATORY_CARE_PROVIDER_SITE_OTHER): Payer: Self-pay | Admitting: Pediatrics

## 2020-01-17 ENCOUNTER — Other Ambulatory Visit: Payer: Self-pay

## 2020-01-17 ENCOUNTER — Ambulatory Visit (INDEPENDENT_AMBULATORY_CARE_PROVIDER_SITE_OTHER): Payer: Medicaid Other | Admitting: Pediatrics

## 2020-01-17 DIAGNOSIS — G8110 Spastic hemiplegia affecting unspecified side: Secondary | ICD-10-CM
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
## 2020-01-17 NOTE — Patient Instructions (Signed)
It was a pleasure to see you today.  I look forward to seeing you on November 22nd.  I will do everything that I can to be here that day.

## 2020-01-31 DIAGNOSIS — T85733A Infection and inflammatory reaction due to implanted electronic neurostimulator of spinal cord, electrode (lead), initial encounter: Secondary | ICD-10-CM | POA: Diagnosis not present

## 2020-01-31 DIAGNOSIS — S06360D Traumatic hemorrhage of cerebrum, unspecified, without loss of consciousness, subsequent encounter: Secondary | ICD-10-CM | POA: Diagnosis not present

## 2020-01-31 DIAGNOSIS — S06360A Traumatic hemorrhage of cerebrum, unspecified, without loss of consciousness, initial encounter: Secondary | ICD-10-CM | POA: Diagnosis not present

## 2020-01-31 DIAGNOSIS — G911 Obstructive hydrocephalus: Secondary | ICD-10-CM | POA: Diagnosis not present

## 2020-02-06 ENCOUNTER — Telehealth: Payer: Self-pay

## 2020-02-06 DIAGNOSIS — K9422 Gastrostomy infection: Secondary | ICD-10-CM | POA: Diagnosis not present

## 2020-02-06 DIAGNOSIS — G8110 Spastic hemiplegia affecting unspecified side: Secondary | ICD-10-CM | POA: Diagnosis not present

## 2020-02-06 DIAGNOSIS — R140 Abdominal distension (gaseous): Secondary | ICD-10-CM | POA: Diagnosis not present

## 2020-02-06 DIAGNOSIS — K9420 Gastrostomy complication, unspecified: Secondary | ICD-10-CM | POA: Diagnosis not present

## 2020-02-06 NOTE — Telephone Encounter (Signed)
Pts mom called requesting to speak with Dr Nadine Counts regarding pt's feeding tube. Says pts feeding tube is bleeding and needs advise on what to do.  Says currently she has a 4x4 to help catch the blood but other than that she is just letting it air. Says before that she had a binder on him and a 2x2 which worked for a little while but isnt working now. Doesn't want pt to lay and bleed too much.

## 2020-02-06 NOTE — Telephone Encounter (Signed)
Spoke to mother on phone.  I have given her the number for the general surgeon that she saw in June.  I am concerned with the amount of blood that she was showing as on the photograph.  Absolutely thinks that he needs in office evaluation.

## 2020-02-07 DIAGNOSIS — R140 Abdominal distension (gaseous): Secondary | ICD-10-CM | POA: Diagnosis not present

## 2020-02-08 ENCOUNTER — Telehealth: Payer: Self-pay

## 2020-02-08 DIAGNOSIS — G8250 Quadriplegia, unspecified: Secondary | ICD-10-CM | POA: Diagnosis not present

## 2020-02-08 DIAGNOSIS — R140 Abdominal distension (gaseous): Secondary | ICD-10-CM | POA: Diagnosis not present

## 2020-02-08 DIAGNOSIS — Z930 Tracheostomy status: Secondary | ICD-10-CM | POA: Diagnosis not present

## 2020-02-08 DIAGNOSIS — L03311 Cellulitis of abdominal wall: Secondary | ICD-10-CM | POA: Diagnosis not present

## 2020-02-08 DIAGNOSIS — K9421 Gastrostomy hemorrhage: Secondary | ICD-10-CM | POA: Diagnosis not present

## 2020-02-08 DIAGNOSIS — K9420 Gastrostomy complication, unspecified: Secondary | ICD-10-CM | POA: Diagnosis not present

## 2020-02-08 DIAGNOSIS — Z993 Dependence on wheelchair: Secondary | ICD-10-CM | POA: Diagnosis not present

## 2020-02-08 NOTE — Telephone Encounter (Signed)
Transition Care Management Follow-up Telephone Call  Date of discharge and from where: [facility] 02/07/2020  How have you been since you were released from the hospital? Doing ok, will be readmitted to hospital  Any questions or concerns? No  Items Reviewed:  Did the pt receive and understand the discharge instructions provided? Yes   Medications obtained and verified? Yes   Other? No   Any new allergies since your discharge? No   Dietary orders reviewed? Yes  Do you have support at home? Yes   Home Care and Equipment/Supplies: Were home health services ordered? not applicable If so, what is the name of the agency?   Has the agency set up a time to come to the patient's home? not applicable Were any new equipment or medical supplies ordered?  No What is the name of the medical supply agency?  Were you able to get the supplies/equipment? not applicable Do you have any questions related to the use of the equipment or supplies? No  Functional Questionnaire: (I = Independent and D = Dependent) ADLs: I  Bathing/Dressing- I  Meal Prep- I  Eating- I  Maintaining continence- I  Transferring/Ambulation- I  Managing Meds- I  Follow up appointments reviewed:   PCP Hospital f/u appt confirmed? Yes    Specialist Hospital f/u appt confirmed? No  .  Are transportation arrangements needed? No   If their condition worsens, is the pt aware to call PCP or go to the Emergency Dept.? Yes  Was the patient provided with contact information for the PCP's office or ED? Yes  Was to pt encouraged to call back with questions or concerns? Yes

## 2020-02-21 DIAGNOSIS — K9429 Other complications of gastrostomy: Secondary | ICD-10-CM | POA: Diagnosis not present

## 2020-02-21 DIAGNOSIS — K9422 Gastrostomy infection: Secondary | ICD-10-CM | POA: Diagnosis not present

## 2020-02-22 DIAGNOSIS — K9422 Gastrostomy infection: Secondary | ICD-10-CM | POA: Diagnosis not present

## 2020-02-23 DIAGNOSIS — G911 Obstructive hydrocephalus: Secondary | ICD-10-CM | POA: Diagnosis not present

## 2020-02-23 DIAGNOSIS — S06360D Traumatic hemorrhage of cerebrum, unspecified, without loss of consciousness, subsequent encounter: Secondary | ICD-10-CM | POA: Diagnosis not present

## 2020-02-23 DIAGNOSIS — T85733A Infection and inflammatory reaction due to implanted electronic neurostimulator of spinal cord, electrode (lead), initial encounter: Secondary | ICD-10-CM | POA: Diagnosis not present

## 2020-02-23 DIAGNOSIS — S06360A Traumatic hemorrhage of cerebrum, unspecified, without loss of consciousness, initial encounter: Secondary | ICD-10-CM | POA: Diagnosis not present

## 2020-02-27 ENCOUNTER — Encounter (INDEPENDENT_AMBULATORY_CARE_PROVIDER_SITE_OTHER): Payer: Self-pay | Admitting: Pediatrics

## 2020-02-27 ENCOUNTER — Ambulatory Visit (INDEPENDENT_AMBULATORY_CARE_PROVIDER_SITE_OTHER): Payer: Medicaid Other | Admitting: Pediatrics

## 2020-02-27 ENCOUNTER — Other Ambulatory Visit: Payer: Self-pay

## 2020-02-27 DIAGNOSIS — G8110 Spastic hemiplegia affecting unspecified side: Secondary | ICD-10-CM | POA: Diagnosis not present

## 2020-02-27 DIAGNOSIS — S06360A Traumatic hemorrhage of cerebrum, unspecified, without loss of consciousness, initial encounter: Secondary | ICD-10-CM | POA: Diagnosis not present

## 2020-02-27 DIAGNOSIS — T85733A Infection and inflammatory reaction due to implanted electronic neurostimulator of spinal cord, electrode (lead), initial encounter: Secondary | ICD-10-CM | POA: Diagnosis not present

## 2020-02-27 DIAGNOSIS — G911 Obstructive hydrocephalus: Secondary | ICD-10-CM | POA: Diagnosis not present

## 2020-02-27 DIAGNOSIS — S06360D Traumatic hemorrhage of cerebrum, unspecified, without loss of consciousness, subsequent encounter: Secondary | ICD-10-CM | POA: Diagnosis not present

## 2020-02-27 NOTE — Patient Instructions (Signed)
Will see you on April 05, 2020.
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
## 2020-03-01 DIAGNOSIS — T85733A Infection and inflammatory reaction due to implanted electronic neurostimulator of spinal cord, electrode (lead), initial encounter: Secondary | ICD-10-CM | POA: Diagnosis not present

## 2020-03-01 DIAGNOSIS — G911 Obstructive hydrocephalus: Secondary | ICD-10-CM | POA: Diagnosis not present

## 2020-03-01 DIAGNOSIS — S06360A Traumatic hemorrhage of cerebrum, unspecified, without loss of consciousness, initial encounter: Secondary | ICD-10-CM | POA: Diagnosis not present

## 2020-03-01 DIAGNOSIS — S06360D Traumatic hemorrhage of cerebrum, unspecified, without loss of consciousness, subsequent encounter: Secondary | ICD-10-CM | POA: Diagnosis not present

## 2020-03-06 ENCOUNTER — Ambulatory Visit (INDEPENDENT_AMBULATORY_CARE_PROVIDER_SITE_OTHER): Payer: Medicaid Other | Admitting: Family Medicine

## 2020-03-06 ENCOUNTER — Ambulatory Visit: Payer: Medicaid Other | Admitting: Family Medicine

## 2020-03-06 DIAGNOSIS — J310 Chronic rhinitis: Secondary | ICD-10-CM | POA: Diagnosis not present

## 2020-03-06 DIAGNOSIS — J329 Chronic sinusitis, unspecified: Secondary | ICD-10-CM

## 2020-03-06 MED ORDER — CETIRIZINE HCL 5 MG/5ML PO SOLN: 5.0000 mg | Freq: Every day | ORAL | 99 refills | Status: DC

## 2020-03-06 MED ORDER — CEFDINIR 250 MG/5ML PO SUSR: 300.0000 mg | Freq: Two times a day (BID) | ORAL | 0 refills | Status: AC

## 2020-03-06 NOTE — Progress Notes (Signed)
Telephone visit  Subjective: CC: URI PCP: Raliegh Ip, DO Scott Bean is a 29 y.o. male calls for telephone consult today. Patient provides verbal consent for consult held via phone.  Due to COVID-19 pandemic this visit was conducted virtually. This visit type was conducted due to national recommendations for restrictions regarding the COVID-19 Pandemic (e.g. social distancing, sheltering in place) in an effort to limit this patient's exposure and mitigate transmission in our community. All issues noted in this document were discussed and addressed.  A physical exam was not performed with this format.   Location of patient: home Location of provider: [facility] Others present for call: mom  1. Sinusitis Mother reports onset of symptoms about 1 week ago.  She reports nasal congestion, sinus pressure.  No purulent drainage. No fever, vomiting or diarrhea. Home nurse listened to lungs and they sounded good.  She is giving him mucinex.   ROS: Per HPI  No Known Allergies Past Medical History:  Diagnosis Date  . Bacteremia 12/10/2011  . Cellulitis 12/27/2012  . 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 PNA (pneumonia) 12/09/2011    Current Outpatient Medications:  .  ascorbic acid (VITAMIN C) 1000 MG tablet, 1,000 mg by Per G Tube route daily., Disp: , Rfl:  .  carbamazepine (TEGRETOL) 100 MG chewable tablet, CRUSH AND GIVE AS DIRECTED. 3.5 TABLETS IN THE MORNING AND BEDTIME, 3 AT NOON AND DINNER, Disp: 39 tablet, Rfl: 5 .  cetirizine HCl (ZYRTEC) 5 MG/5ML SYRP, 1 mg. 1 mg by Tube route daily as needed., Disp: , Rfl:  .  clotrimazole-betamethasone (LOTRISONE) cream, Apply topically 2 (two) times daily as needed. x7-10 days (NOT TO FACE), Disp: 30 g, Rfl: 1 .  fluticasone (FLONASE) 50 MCG/ACT nasal spray, USE 2 SPRAYS IN EACH NOSTRIL ONCE DAILY., Disp: 48 g, Rfl: 1 .  Lactobacillus Rhamnosus, GG, (RA PROBIOTIC DIGESTIVE  CARE) CAPS, Take 1 capsule by mouth daily., Disp: , Rfl:  .  levETIRAcetam (KEPPRA) 100 MG/ML solution, TAKE 12.5 mL  (2.5 TEASPOONS) TWICE A DAY, Disp: 800 mL, Rfl: 5 .  Omega-3 Fatty Acids (FISH OIL) 1200 MG CAPS, Take by mouth., Disp: , Rfl:  .  polyethylene glycol powder (GLYCOLAX/MIRALAX) 17 GM/SCOOP powder, 17 g by Per G Tube route daily as needed for Constipation., Disp: , Rfl:  .  TEGRETOL 100 MG/5ML suspension, TAKE 16.5 ML EVERY 6 HOURS, Disp: 2250 mL, Rfl: 5  Assessment/ Plan: 29 y.o. male   Rhinosinusitis - Plan: cetirizine HCl (ZYRTEC) 5 MG/5ML SOLN, cefdinir (OMNICEF) 250 MG/5ML suspension  I think this is more allergic in nature and have encouraged her to use nasal saline spray, Flonase nasal spray.  I renewed his Zyrtec for as needed use.  Additionally, encouraged to modification.  We discussed that if symptoms are not responding to home treatments and/or are progressing over the next several days, okay to start the [facility].  Pocket prescription has been sent to pharmacy but I encouraged her not to fill this unless needed.  She understands the plan and recommendations.  She will follow-up as needed  Start time: 12:53pm End time: 1:04pm  Total time spent on patient care (including telephone call/ virtual visit): 11 minutes  Jaleyah Longhi Hulen Skains, DO [facility] [phone]

## 2020-03-07 DIAGNOSIS — G8110 Spastic hemiplegia affecting unspecified side: Secondary | ICD-10-CM | POA: Diagnosis not present

## 2020-03-20 DIAGNOSIS — G911 Obstructive hydrocephalus: Secondary | ICD-10-CM | POA: Diagnosis not present

## 2020-03-20 DIAGNOSIS — S06360A Traumatic hemorrhage of cerebrum, unspecified, without loss of consciousness, initial encounter: Secondary | ICD-10-CM | POA: Diagnosis not present

## 2020-03-20 DIAGNOSIS — T85733A Infection and inflammatory reaction due to implanted electronic neurostimulator of spinal cord, electrode (lead), initial encounter: Secondary | ICD-10-CM | POA: Diagnosis not present

## 2020-03-20 DIAGNOSIS — S06360D Traumatic hemorrhage of cerebrum, unspecified, without loss of consciousness, subsequent encounter: Secondary | ICD-10-CM | POA: Diagnosis not present

## 2020-03-23 DIAGNOSIS — S06360D Traumatic hemorrhage of cerebrum, unspecified, without loss of consciousness, subsequent encounter: Secondary | ICD-10-CM | POA: Diagnosis not present

## 2020-03-23 DIAGNOSIS — G911 Obstructive hydrocephalus: Secondary | ICD-10-CM | POA: Diagnosis not present

## 2020-03-23 DIAGNOSIS — T85733A Infection and inflammatory reaction due to implanted electronic neurostimulator of spinal cord, electrode (lead), initial encounter: Secondary | ICD-10-CM | POA: Diagnosis not present

## 2020-03-23 DIAGNOSIS — S06360A Traumatic hemorrhage of cerebrum, unspecified, without loss of consciousness, initial encounter: Secondary | ICD-10-CM | POA: Diagnosis not present

## 2020-04-05 ENCOUNTER — Encounter (INDEPENDENT_AMBULATORY_CARE_PROVIDER_SITE_OTHER): Payer: Self-pay | Admitting: Pediatrics

## 2020-04-05 ENCOUNTER — Ambulatory Visit (INDEPENDENT_AMBULATORY_CARE_PROVIDER_SITE_OTHER): Payer: Medicaid Other | Admitting: Pediatrics

## 2020-04-05 ENCOUNTER — Other Ambulatory Visit: Payer: Self-pay

## 2020-04-05 ENCOUNTER — Encounter: Payer: Self-pay | Admitting: Family Medicine

## 2020-04-05 ENCOUNTER — Ambulatory Visit (INDEPENDENT_AMBULATORY_CARE_PROVIDER_SITE_OTHER): Payer: Medicaid Other | Admitting: Family Medicine

## 2020-04-05 VITALS — BP 147/92 | HR 79 | Temp 98.0°F

## 2020-04-05 DIAGNOSIS — H6122 Impacted cerumen, left ear: Secondary | ICD-10-CM | POA: Diagnosis not present

## 2020-04-05 DIAGNOSIS — G40309 Generalized idiopathic epilepsy and epileptic syndromes, not intractable, without status epilepticus: Secondary | ICD-10-CM | POA: Diagnosis not present

## 2020-04-05 DIAGNOSIS — G8110 Spastic hemiplegia affecting unspecified side: Secondary | ICD-10-CM

## 2020-04-05 NOTE — Patient Instructions (Signed)
We will see you on February 10.  Please let me know if you need any help between now and then.
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
## 2020-04-05 NOTE — Patient Instructions (Signed)
Debrox ear drops over the counter as needed to soften ear wax.   Earwax Buildup, Adult The ears produce a substance called earwax that helps keep bacteria out of the ear and protects the skin in the ear canal. Occasionally, earwax can build up in the ear and cause discomfort or hearing loss. What increases the risk? This condition is more likely to develop in people who:  Are male.  Are elderly.  Naturally produce more earwax.  Clean their ears often with cotton swabs.  Use earplugs often.  Use in-ear headphones often.  Wear hearing aids.  Have narrow ear canals.  Have earwax that is overly thick or sticky.  Have eczema.  Are dehydrated.  Have excess hair in the ear canal. What are the signs or symptoms? Symptoms of this condition include:  Reduced or muffled hearing.  A feeling of fullness in the ear or feeling that the ear is plugged.  Fluid coming from the ear.  Ear pain.  Ear itch.  Ringing in the ear.  Coughing.  An obvious piece of earwax that can be seen inside the ear canal. How is this diagnosed? This condition may be diagnosed based on:  Your symptoms.  Your medical history.  An ear exam. During the exam, your health care provider will look into your ear with an instrument called an otoscope. You may have tests, including a hearing test. How is this treated? This condition may be treated by:  Using ear drops to soften the earwax.  Having the earwax removed by a health care provider. The health care provider may: ? Flush the ear with water. ? Use an instrument that has a loop on the end (curette). ? Use a suction device.  Surgery to remove the wax buildup. This may be done in severe cases. Follow these instructions at home:   Take over-the-counter and prescription medicines only as told by your health care provider.  Do not put any objects, including cotton swabs, into your ear. You can clean the opening of your ear canal with a  washcloth or facial tissue.  Follow instructions from your health care provider about cleaning your ears. Do not over-clean your ears.  Drink enough fluid to keep your urine clear or pale yellow. This will help to thin the earwax.  Keep all follow-up visits as told by your health care provider. If earwax builds up in your ears often or if you use hearing aids, consider seeing your health care provider for routine, preventive ear cleanings. Ask your health care provider how often you should schedule your cleanings.  If you have hearing aids, clean them according to instructions from the manufacturer and your health care provider. Contact a health care provider if:  You have ear pain.  You develop a fever.  You have blood, pus, or other fluid coming from your ear.  You have hearing loss.  You have ringing in your ears that does not go away.  Your symptoms do not improve with treatment.  You feel like the room is spinning (vertigo). Summary  Earwax can build up in the ear and cause discomfort or hearing loss.  The most common symptoms of this condition include reduced or muffled hearing and a feeling of fullness in the ear or feeling that the ear is plugged.  This condition may be diagnosed based on your symptoms, your medical history, and an ear exam.  This condition may be treated by using ear drops to soften the earwax or by  having the earwax removed by a health care provider.  Do not put any objects, including cotton swabs, into your ear. You can clean the opening of your ear canal with a washcloth or facial tissue. This information is not intended to replace advice given to you by your health care provider. Make sure you discuss any questions you have with your health care provider. Document Revised: 03/06/2017 Document Reviewed: 06/04/2016 Elsevier Patient Education  2020 [employer].

## 2020-04-05 NOTE — Progress Notes (Signed)
Subjective: CC: ear wax buildup PCP: Raliegh Ip, DO  Scott Bean is a 29 y.o. male presenting to clinic today for:  1. Ear wax buildup Mother reports that Scott Bean has had increased ear wax in his left ear for about a week and a half. Mother reports that he doesn't seem to be in pain. Denies fever, drainage, tenderness, or redness. She has been cleaning his ears with a Q-tip. She has not tried any other remedies. He has not had an issue with ear wax buildup in the past.   Relevant past medical, surgical, family, and social history reviewed and updated as indicated.  Allergies and medications reviewed and updated.  No Known Allergies Past Medical History:  Diagnosis Date  . Bacteremia 12/10/2011  . Cellulitis 12/27/2012  . 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 PNA (pneumonia) 12/09/2011    Current Outpatient Medications:  .  ascorbic acid (VITAMIN C) 1000 MG tablet, 1,000 mg by Per G Tube route daily., Disp: , Rfl:  .  carbamazepine (TEGRETOL) 100 MG chewable tablet, CRUSH AND GIVE AS DIRECTED. 3.5 TABLETS IN THE MORNING AND BEDTIME, 3 AT NOON AND DINNER, Disp: 39 tablet, Rfl: 5 .  cetirizine HCl (ZYRTEC) 5 MG/5ML SOLN, Take 5 mLs (5 mg total) by mouth daily., Disp: 150 mL, Rfl: prn .  clotrimazole-betamethasone (LOTRISONE) cream, Apply topically 2 (two) times daily as needed. x7-10 days (NOT TO FACE), Disp: 30 g, Rfl: 1 .  fluticasone (FLONASE) 50 MCG/ACT nasal spray, USE 2 SPRAYS IN EACH NOSTRIL ONCE DAILY., Disp: 48 g, Rfl: 1 .  Lactobacillus Rhamnosus, GG, (RA PROBIOTIC DIGESTIVE CARE) CAPS, Take 1 capsule by mouth daily., Disp: , Rfl:  .  levETIRAcetam (KEPPRA) 100 MG/ML solution, TAKE 12.5 mL  (2.5 TEASPOONS) TWICE A DAY, Disp: 800 mL, Rfl: 5 .  Omega-3 Fatty Acids (FISH OIL) 1200 MG CAPS, Take by mouth., Disp: , Rfl:  .  polyethylene glycol powder (GLYCOLAX/MIRALAX) 17 GM/SCOOP powder, 17 g by Per G Tube  route daily as needed for Constipation., Disp: , Rfl:  .  TEGRETOL 100 MG/5ML suspension, TAKE 16.5 ML EVERY 6 HOURS, Disp: 2250 mL, Rfl: 5 Social History   Socioeconomic History  . Marital status: Single    Spouse name: Not on file  . Number of children: Not on file  . Years of education: Not on file  . Highest education level: Not on file  Occupational History  . Not on file  Tobacco Use  . Smoking status: Never Smoker  . Smokeless tobacco: Never Used  Vaping Use  . Vaping Use: Never used  Substance and Sexual Activity  . Alcohol use: No  . Drug use: No  . Sexual activity: Never  Other Topics Concern  . Not on file  Social History Narrative   Scott Bean is a 29 yo male.   Scott Bean graduated from [employer] in 2016.    He lives with his mother.   Social Determinants of Health   Financial Resource Strain: Not on file  Food Insecurity: Not on file  Transportation Needs: Not on file  Physical Activity: Not on file  Stress: Not on file  Social Connections: Not on file  Intimate Partner Violence: Not on file   Family History  Problem Relation Age of Onset  . Heart Problems Maternal Grandfather        Died at 48  . Lung cancer Maternal Grandmother  Died at 39  . Liver cancer Maternal Grandmother     Review of Systems  As per HPI.   Objective: Office vital signs reviewed. BP (!) 147/92   Pulse 79   Temp 98 F (36.7 C) (Temporal)    Physical Examination:  Physical Exam Vitals and nursing note reviewed.  Constitutional:      Appearance: He is not ill-appearing, toxic-appearing or diaphoretic.  HENT:     Right Ear: Tympanic membrane, ear canal and external ear normal.     Left Ear: External ear normal. No drainage, swelling or tenderness. There is impacted cerumen. No mastoid tenderness.  Eyes:     Conjunctiva/sclera: Conjunctivae normal.  Pulmonary:     Effort: Pulmonary effort is normal.  Skin:    General: Skin is warm and dry.  Neurological:      Mental Status: Mental status is at baseline.     Ear Cerumen Removal  Date/Time: 04/05/2020 4:26 PM Performed by: Scott Earing, FNP Authorized by: Scott Earing, FNP   Anesthesia: Local Anesthetic: none Location details: left ear Patient tolerance: patient tolerated the procedure well with no immediate complications Procedure type: irrigation  Sedation: Patient sedated: no     Assessment/ Plan: Scott Bean was seen today for cerumen impaction.  Diagnoses and all orders for this visit:  Impacted cerumen of left ear Small amount of cerumen left behind. Able to visual part of TM, no signs of infection. Discussed Debrox drops OTC to soften the remaining cerumen. Handout given. Return to office for new or worsening symptoms, or if symptoms persist.  -     Ear Lavage  Follow up with PCP as needed.   The above assessment and management plan was discussed with the patient. The patient verbalized understanding of and has agreed to the management plan. Patient is aware to call the clinic if symptoms persist or worsen. Patient is aware when to return to the clinic for a follow-up visit. Patient educated on when it is appropriate to go to the emergency department.   Harlow Mares, FNP-C [facility] [address] [place], [place] [address] [phone]

## 2020-04-07 DIAGNOSIS — G8110 Spastic hemiplegia affecting unspecified side: Secondary | ICD-10-CM | POA: Diagnosis not present

## 2020-04-13 ENCOUNTER — Ambulatory Visit: Payer: Medicaid Other | Admitting: Family Medicine

## 2020-04-20 ENCOUNTER — Other Ambulatory Visit: Payer: Self-pay | Admitting: Family Medicine

## 2020-04-20 DIAGNOSIS — K5909 Other constipation: Secondary | ICD-10-CM

## 2020-04-20 DIAGNOSIS — Z789 Other specified health status: Secondary | ICD-10-CM

## 2020-04-20 DIAGNOSIS — G8110 Spastic hemiplegia affecting unspecified side: Secondary | ICD-10-CM

## 2020-04-20 MED ORDER — LACTULOSE 20 GM/30ML PO SOLN: 10.0000 g | Freq: Three times a day (TID) | ORAL | 0 refills | Status: DC | PRN

## 2020-04-20 NOTE — Progress Notes (Signed)
Chronic constipation that is refractory to daily MiraLAX.  I have instructed her to increase his MiraLAX to twice daily for the next week if she is not noticing a great deal of improvement in bowel movements and bloating then I would switch to lactulose which has been sent to pharmacy and placed on file.  We discussed that the evidence really points to [facility] as a superior product so would really like to try that first.  He is a candidate for Linzess that we can give per tube if dissolved in an ounce of fluid and given per tube.  This will be a backup plan

## 2020-04-20 NOTE — Addendum Note (Signed)
Addended by: Vanice Sarah D on: 04/20/2020 01:44 PM   Modules accepted: Orders

## 2020-04-24 DIAGNOSIS — S06360D Traumatic hemorrhage of cerebrum, unspecified, without loss of consciousness, subsequent encounter: Secondary | ICD-10-CM | POA: Diagnosis not present

## 2020-04-24 DIAGNOSIS — T85733A Infection and inflammatory reaction due to implanted electronic neurostimulator of spinal cord, electrode (lead), initial encounter: Secondary | ICD-10-CM | POA: Diagnosis not present

## 2020-04-24 DIAGNOSIS — S06360A Traumatic hemorrhage of cerebrum, unspecified, without loss of consciousness, initial encounter: Secondary | ICD-10-CM | POA: Diagnosis not present

## 2020-04-24 DIAGNOSIS — G911 Obstructive hydrocephalus: Secondary | ICD-10-CM | POA: Diagnosis not present

## 2020-04-25 ENCOUNTER — Encounter: Payer: Self-pay | Admitting: Family Medicine

## 2020-04-26 ENCOUNTER — Telehealth: Payer: Self-pay | Admitting: Family Medicine

## 2020-04-30 ENCOUNTER — Other Ambulatory Visit: Payer: Self-pay | Admitting: Family Medicine

## 2020-04-30 NOTE — Progress Notes (Signed)
Error, F2F scheduled

## 2020-05-01 ENCOUNTER — Encounter: Payer: Self-pay | Admitting: Family Medicine

## 2020-05-03 ENCOUNTER — Encounter: Payer: Self-pay | Admitting: Family Medicine

## 2020-05-08 DIAGNOSIS — S06360A Traumatic hemorrhage of cerebrum, unspecified, without loss of consciousness, initial encounter: Secondary | ICD-10-CM | POA: Diagnosis not present

## 2020-05-08 DIAGNOSIS — R32 Unspecified urinary incontinence: Secondary | ICD-10-CM | POA: Diagnosis not present

## 2020-05-08 DIAGNOSIS — T85733A Infection and inflammatory reaction due to implanted electronic neurostimulator of spinal cord, electrode (lead), initial encounter: Secondary | ICD-10-CM | POA: Diagnosis not present

## 2020-05-08 DIAGNOSIS — G911 Obstructive hydrocephalus: Secondary | ICD-10-CM | POA: Diagnosis not present

## 2020-05-08 DIAGNOSIS — G8110 Spastic hemiplegia affecting unspecified side: Secondary | ICD-10-CM | POA: Diagnosis not present

## 2020-05-17 ENCOUNTER — Encounter: Payer: Self-pay | Admitting: Family Medicine

## 2020-05-17 ENCOUNTER — Other Ambulatory Visit: Payer: Self-pay

## 2020-05-17 ENCOUNTER — Ambulatory Visit (INDEPENDENT_AMBULATORY_CARE_PROVIDER_SITE_OTHER): Payer: Medicaid Other | Admitting: Pediatrics

## 2020-05-17 ENCOUNTER — Encounter (INDEPENDENT_AMBULATORY_CARE_PROVIDER_SITE_OTHER): Payer: Self-pay | Admitting: Pediatrics

## 2020-05-17 DIAGNOSIS — G8110 Spastic hemiplegia affecting unspecified side: Secondary | ICD-10-CM | POA: Diagnosis not present

## 2020-05-17 DIAGNOSIS — G40309 Generalized idiopathic epilepsy and epileptic syndromes, not intractable, without status epilepticus: Secondary | ICD-10-CM | POA: Diagnosis not present

## 2020-05-17 DIAGNOSIS — Z79899 Other long term (current) drug therapy: Secondary | ICD-10-CM
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
## 2020-05-17 NOTE — Patient Instructions (Signed)
Next appointment 06/28/20.

## 2020-05-18 ENCOUNTER — Ambulatory Visit (INDEPENDENT_AMBULATORY_CARE_PROVIDER_SITE_OTHER): Payer: Medicaid Other | Admitting: Family Medicine

## 2020-05-18 ENCOUNTER — Encounter: Payer: Self-pay | Admitting: Family Medicine

## 2020-05-18 VITALS — BP 109/69 | HR 78 | Temp 97.3°F

## 2020-05-18 DIAGNOSIS — R140 Abdominal distension (gaseous): Secondary | ICD-10-CM

## 2020-05-18 DIAGNOSIS — K9423 Gastrostomy malfunction: Secondary | ICD-10-CM

## 2020-05-18 DIAGNOSIS — G40309 Generalized idiopathic epilepsy and epileptic syndromes, not intractable, without status epilepticus: Secondary | ICD-10-CM | POA: Diagnosis not present

## 2020-05-18 DIAGNOSIS — G8110 Spastic hemiplegia affecting unspecified side: Secondary | ICD-10-CM | POA: Diagnosis not present

## 2020-05-18 DIAGNOSIS — K5909 Other constipation: Secondary | ICD-10-CM

## 2020-05-18 MED ORDER — LACTULOSE 20 GM/30ML PO SOLN: 10.0000 g | Freq: Three times a day (TID) | ORAL | 99 refills | Status: DC | PRN

## 2020-05-18 NOTE — Progress Notes (Signed)
 Subjective: CC: Checkup PCP: ,  M, DO HPI:Scott Bean is a 30 y.o. male presenting to clinic today for:  1.  Generalized convulsive epilepsy, spastic hemiplegia both dominant and nondominant side/ bloating Scott Bean is brought to the office by his mother.  He has had the above conditions for over 15 years.  He is totally reliant upon care by his mother and is currently fed and given medicines through a G-tube.  She notes that he has been having quite a bit of bloating.  He seems to be responding well to lactulose but she still has to give him an enema almost daily.  She is not seen any rectal bleeding lately.  He has had very good bowel movements over the last 3 days but still has some bloating such that he will have leakage from his G-tube.  Apparently the home health services that they did have are no longer active.  He has not had his formula looked at lately.  He saw his neurologist recently.  No changes made.  Would like to get his Keppra and carbamazepine levels today.   ROS: Per HPI  No Known Allergies Past Medical History:  Diagnosis Date  . Bacteremia 12/10/2011  . Cellulitis 12/27/2012  . 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 PNA (pneumonia) 12/09/2011    Current Outpatient Medications:  .  ascorbic acid (VITAMIN C) 1000 MG tablet, 1,000 mg by Per G Tube route daily., Disp: , Rfl:  .  carbamazepine (TEGRETOL) 100 MG chewable tablet, CRUSH AND GIVE AS DIRECTED. 3.5 TABLETS IN THE MORNING AND BEDTIME, 3 AT NOON AND DINNER, Disp: 39 tablet, Rfl: 5 .  cetirizine HCl (ZYRTEC) 5 MG/5ML SOLN, Take 5 mLs (5 mg total) by mouth daily., Disp: 150 mL, Rfl: prn .  clotrimazole-betamethasone (LOTRISONE) cream, Apply topically 2 (two) times daily as needed. x7-10 days (NOT TO FACE), Disp: 30 g, Rfl: 1 .  fluticasone (FLONASE) 50 MCG/ACT nasal spray, USE 2 SPRAYS IN EACH NOSTRIL ONCE DAILY., Disp: 48 g, Rfl:  1 .  Lactobacillus Rhamnosus, GG, (RA PROBIOTIC DIGESTIVE CARE) CAPS, Take 1 capsule by mouth daily., Disp: , Rfl:  .  Lactulose 20 GM/30ML SOLN, Place 15 mLs (10 g total) into feeding tube 3 (three) times daily as needed (constipation). PUT ON FILE, Disp: 450 mL, Rfl: 0 .  levETIRAcetam (KEPPRA) 100 MG/ML solution, TAKE 12.5 mL  (2.5 TEASPOONS) TWICE A DAY, Disp: 800 mL, Rfl: 5 .  Omega-3 Fatty Acids (FISH OIL) 1200 MG CAPS, Take by mouth., Disp: , Rfl:  .  polyethylene glycol powder (GLYCOLAX/MIRALAX) 17 GM/SCOOP powder, 17 g by Per G Tube route daily as needed for Constipation., Disp: , Rfl:  .  TEGRETOL 100 MG/5ML suspension, TAKE 16.5 ML EVERY 6 HOURS, Disp: 2250 mL, Rfl: 5 Social History   Socioeconomic History  . Marital status: Single    Spouse name: Not on file  . Number of children: Not on file  . Years of education: Not on file  . Highest education level: Not on file  Occupational History  . Not on file  Tobacco Use  . Smoking status: Never Smoker  . Smokeless tobacco: Never Used  Vaping Use  . Vaping Use: Never used  Substance and Sexual Activity  . Alcohol use: No  . Drug use: No  . Sexual activity: Never  Other Topics Concern  . Not on file  Social History Narrative   Scott Bean   is a 30 yo male.   Scott Bean graduated from [employer] in 2016.    He lives with his mother.   Social Determinants of Health   Financial Resource Strain: Not on file  Food Insecurity: Not on file  Transportation Needs: Not on file  Physical Activity: Not on file  Stress: Not on file  Social Connections: Not on file  Intimate Partner Violence: Not on file   Family History  Problem Relation Age of Onset  . Heart Problems Maternal Grandfather        Died at 55  . Lung cancer Maternal Grandmother        Died at 55  . Liver cancer Maternal Grandmother     Objective: Office vital signs reviewed. BP 109/69   Pulse 78   Temp (!) 97.3 F (36.3 C) (Temporal)   SpO2 96%    Physical Examination:  General: Awake, alert, No acute distress HEENT: exotropia; sclera white Cardio: regular rate and rhythm, S1S2 heard, no murmurs appreciated Pulm: clear to auscultation bilaterally, no wheezes, rhonchi or rales; normal work of breathing on room air.  There are some intermittent transmitted upper airway sounds noted the lungs themselves are clear GI: Moderate amounts of abdominal bloating but does not appear to be tender.  There is a G-tube in place on the left side of the abdomen with some slightly pigmented fluid on the pad surrounding it but no active drainage.  Nose appreciable erythema, purulence MSK: Wheelchair dependent.  Contractures noted of bilateral hands right worse than left.  Muscle wasting of the lower extremities Skin: dry; intact; no rashes or lesions; specifically no appreciable skin breakdown along the palms of the hands Neuro: quadriplegic; responds to stimuli  Assessment/ Plan: 30 y.o. male   Generalized convulsive epilepsy ([place]) - Plan: CMP14+EGFR, Carbamazepine level, total, Levetiracetam level, CBC with Differential, Ambulatory referral to [place]  Spastic hemiplegia affecting nondominant side ([place]) - Plan: Ambulatory referral to Home Health  Spastic hemiplegia affecting dominant side ([place]) - Plan: Ambulatory referral to Home Health  Abdominal bloating - Plan: Ambulatory referral to [place]  Other constipation - Plan: Ambulatory referral to [place], Lactulose 20 GM/30ML SOLN  Leaking percutaneous endoscopic gastrostomy (PEG) tube ([place]) - Plan: Ambulatory referral to [place] to follow-up with neurology as scheduled.  Check Keppra and Tegretol level.  These have been CCed to his neurologist  I ordered home health to aid with ongoing surveillance and management of this patient.  Offered PT OT but mother declined today  With regards to his abdominal bloating I wonder if we can perhaps choose a different formula for  this patient.  I hope that this will reduce bloating but in the interim, okay to continue with lactulose.  This has been renewed for the patient.  No orders of the defined types were placed in this encounter.  No orders of the defined types were placed in this encounter.    Janora Norlander, DO [place] [phone]

## 2020-05-19 NOTE — Telephone Encounter (Signed)
I reviewed the lab work from May 18, 2020.  Comprehensive metabolic panel shows a sodium of 133 which is related to a side effect of carbamazepine causing mild hyponatremia the chloride is low because of that.  Alkaline phosphatase is elevated but not clinically important.  I think that the elevated ALT may be related in part to a fatty liver but I cannot be certain.  The rest of liver functions are normal.  Carbamazepine is markedly elevated as it has been in the past.  Unfortunately it represents a relative peak and not a morning trough level.  He takes 16.5 mL (330 mg) every 6 hours.  Mother starts that at 6 AM.  In all likelihood he needs to be taking less carbamazepine, but we need to obtain a morning trough level around 8 AM where she has held given carbamazepine until blood is drawn.  I told her to drop carbamazepine down to 15 mL every 6 hours.  I would like to do this for about a week before we check his blood work.  We need to check blood work the following week allowing this to set with a steady state over the next week.  I written an order only for carbamazepine.  Hopefully will be able to be seen by the office.  I am not worried about the levetiracetam level.  We do not use that to adjust medication.  He has been on high doses of carbamazepine for a long time in part to keep him from having seizures but I do not want him to have a persistently toxic level.

## 2020-05-21 ENCOUNTER — Encounter: Payer: Self-pay | Admitting: Family Medicine

## 2020-05-21 LAB — CMP14+EGFR
ALT: 79 IU/L — ABNORMAL HIGH (ref 0–44)
AST: 25 IU/L (ref 0–40)
Albumin/Globulin Ratio: 1.5 (ref 1.2–2.2)
Albumin: 4.4 g/dL (ref 4.1–5.2)
Alkaline Phosphatase: 189 IU/L — ABNORMAL HIGH (ref 44–121)
BUN/Creatinine Ratio: 13 (ref 9–20)
BUN: 6 mg/dL (ref 6–20)
Bilirubin Total: <0.2 mg/dL (ref 0.0–1.2)
CO2: 24 mmol/L (ref 20–29)
Calcium: 9.4 mg/dL (ref 8.7–10.2)
Chloride: 92 mmol/L — ABNORMAL LOW (ref 96–106)
Creatinine, Ser: 0.45 mg/dL — ABNORMAL LOW (ref 0.76–1.27)
GFR calc Af Amer: 177 mL/min/{1.73_m2} (ref >59)
GFR calc non Af Amer: 153 mL/min/{1.73_m2} (ref >59)
Globulin, Total: 3 g/dL (ref 1.5–4.5)
Glucose: 97 mg/dL (ref 65–99)
Potassium: 4 mmol/L (ref 3.5–5.2)
Sodium: 133 mmol/L — ABNORMAL LOW (ref 134–144)
Total Protein: 7.4 g/dL (ref 6.0–8.5)

## 2020-05-21 LAB — CBC WITH DIFFERENTIAL/PLATELET
Basophils Absolute: 0 10*3/uL (ref 0.0–0.2)
Basos: 1 %
EOS (ABSOLUTE): 0.2 10*3/uL (ref 0.0–0.4)
Eos: 2 %
Hematocrit: 40.1 % (ref 37.5–51.0)
Hemoglobin: 14.3 g/dL (ref 13.0–17.7)
Immature Grans (Abs): 0 10*3/uL (ref 0.0–0.1)
Immature Granulocytes: 0 %
Lymphocytes Absolute: 1.1 10*3/uL (ref 0.7–3.1)
Lymphs: 15 %
MCH: 32.4 pg (ref 26.6–33.0)
MCHC: 35.7 g/dL (ref 31.5–35.7)
MCV: 91 fL (ref 79–97)
Monocytes Absolute: 0.8 10*3/uL (ref 0.1–0.9)
Monocytes: 11 %
Neutrophils Absolute: 5.2 10*3/uL (ref 1.4–7.0)
Neutrophils: 71 %
Platelets: 225 10*3/uL (ref 150–450)
RBC: 4.42 x10E6/uL (ref 4.14–5.80)
RDW: 13 % (ref 11.6–15.4)
WBC: 7.4 10*3/uL (ref 3.4–10.8)

## 2020-05-21 LAB — LEVETIRACETAM LEVEL: Levetiracetam Lvl: 30.6 ug/mL (ref 10.0–40.0)

## 2020-05-21 LAB — CARBAMAZEPINE LEVEL, TOTAL: Carbamazepine (Tegretol), S: 16.3 ug/mL (ref 4.0–12.0)

## 2020-05-22 DIAGNOSIS — T85733A Infection and inflammatory reaction due to implanted electronic neurostimulator of spinal cord, electrode (lead), initial encounter: Secondary | ICD-10-CM | POA: Diagnosis not present

## 2020-05-22 DIAGNOSIS — S06360A Traumatic hemorrhage of cerebrum, unspecified, without loss of consciousness, initial encounter: Secondary | ICD-10-CM | POA: Diagnosis not present

## 2020-05-22 DIAGNOSIS — S06360D Traumatic hemorrhage of cerebrum, unspecified, without loss of consciousness, subsequent encounter: Secondary | ICD-10-CM | POA: Diagnosis not present

## 2020-05-22 DIAGNOSIS — G911 Obstructive hydrocephalus: Secondary | ICD-10-CM | POA: Diagnosis not present

## 2020-05-24 ENCOUNTER — Encounter (INDEPENDENT_AMBULATORY_CARE_PROVIDER_SITE_OTHER): Payer: Self-pay

## 2020-05-24 ENCOUNTER — Other Ambulatory Visit: Payer: Self-pay | Admitting: Family Medicine

## 2020-05-24 DIAGNOSIS — G911 Obstructive hydrocephalus: Secondary | ICD-10-CM | POA: Diagnosis not present

## 2020-05-24 DIAGNOSIS — S06360D Traumatic hemorrhage of cerebrum, unspecified, without loss of consciousness, subsequent encounter: Secondary | ICD-10-CM | POA: Diagnosis not present

## 2020-05-24 DIAGNOSIS — T85733A Infection and inflammatory reaction due to implanted electronic neurostimulator of spinal cord, electrode (lead), initial encounter: Secondary | ICD-10-CM | POA: Diagnosis not present

## 2020-05-24 DIAGNOSIS — S06360A Traumatic hemorrhage of cerebrum, unspecified, without loss of consciousness, initial encounter: Secondary | ICD-10-CM | POA: Diagnosis not present

## 2020-05-28 ENCOUNTER — Other Ambulatory Visit: Payer: Medicaid Other

## 2020-05-28 ENCOUNTER — Other Ambulatory Visit: Payer: Self-pay

## 2020-05-28 DIAGNOSIS — Z79899 Other long term (current) drug therapy: Secondary | ICD-10-CM

## 2020-05-28 DIAGNOSIS — G40309 Generalized idiopathic epilepsy and epileptic syndromes, not intractable, without status epilepticus: Secondary | ICD-10-CM | POA: Diagnosis not present

## 2020-05-29 ENCOUNTER — Other Ambulatory Visit: Payer: Self-pay | Admitting: *Deleted

## 2020-05-29 ENCOUNTER — Encounter: Payer: Self-pay | Admitting: Family Medicine

## 2020-05-29 ENCOUNTER — Encounter (INDEPENDENT_AMBULATORY_CARE_PROVIDER_SITE_OTHER): Payer: Self-pay

## 2020-05-29 LAB — CARBAMAZEPINE LEVEL, TOTAL: Carbamazepine (Tegretol), S: 13.1 ug/mL (ref 4.0–12.0)

## 2020-05-29 NOTE — Patient Instructions (Signed)
Ms. Robinson(mother to Hortencia Conradi),  Thank you for taking time to speak with me today about care coordination and care management services available to [facility] at no cost as part of his Medicaid benefit. These services are voluntary. Our team is available to provide assistance regarding Yoseph's health care needs at any time. Please do not hesitate to reach out to me if we can be of service to you at any time in the future. [phone]  Estanislado Emms RN, BSN [place]  [facility] RN Care Coordinator

## 2020-05-29 NOTE — Patient Outreach (Signed)
Care Coordination  05/29/2020  Scott Bean 1990-11-27 [id number]   [name] was referred to the [facility] Managed Care High Risk team for assistance with care coordination and care management services. Care coordination/care management services as part of the Medicaid benefit was offered to the patient today. The patient's mother, Scott Bean declined assistance offered today. Ms. [name] reports no needs at this time and will reach out to the [name] Managed Care Team if needs arise.  Plan: The Medicaid Managed Care High Risk team is available at any time in the future to assist with care coordination/care management services upon referral.   Estanislado Emms RN, BSN [place]  [facility] RN Care Coordinator

## 2020-06-04 ENCOUNTER — Encounter: Payer: Self-pay | Admitting: Family Medicine

## 2020-06-05 ENCOUNTER — Other Ambulatory Visit: Payer: Self-pay | Admitting: Family Medicine

## 2020-06-05 DIAGNOSIS — Z9189 Other specified personal risk factors, not elsewhere classified: Secondary | ICD-10-CM

## 2020-06-05 DIAGNOSIS — G8110 Spastic hemiplegia affecting unspecified side: Secondary | ICD-10-CM | POA: Diagnosis not present

## 2020-06-05 NOTE — Telephone Encounter (Signed)
Scott Bean. The number on the order is the fax number.

## 2020-06-07 DIAGNOSIS — G911 Obstructive hydrocephalus: Secondary | ICD-10-CM | POA: Diagnosis not present

## 2020-06-07 DIAGNOSIS — S06360D Traumatic hemorrhage of cerebrum, unspecified, without loss of consciousness, subsequent encounter: Secondary | ICD-10-CM | POA: Diagnosis not present

## 2020-06-07 DIAGNOSIS — S06360A Traumatic hemorrhage of cerebrum, unspecified, without loss of consciousness, initial encounter: Secondary | ICD-10-CM | POA: Diagnosis not present

## 2020-06-07 DIAGNOSIS — T85733A Infection and inflammatory reaction due to implanted electronic neurostimulator of spinal cord, electrode (lead), initial encounter: Secondary | ICD-10-CM | POA: Diagnosis not present

## 2020-06-14 DIAGNOSIS — S06360A Traumatic hemorrhage of cerebrum, unspecified, without loss of consciousness, initial encounter: Secondary | ICD-10-CM | POA: Diagnosis not present

## 2020-06-14 DIAGNOSIS — T85733A Infection and inflammatory reaction due to implanted electronic neurostimulator of spinal cord, electrode (lead), initial encounter: Secondary | ICD-10-CM | POA: Diagnosis not present

## 2020-06-14 DIAGNOSIS — S06360D Traumatic hemorrhage of cerebrum, unspecified, without loss of consciousness, subsequent encounter: Secondary | ICD-10-CM | POA: Diagnosis not present

## 2020-06-14 DIAGNOSIS — G911 Obstructive hydrocephalus: Secondary | ICD-10-CM | POA: Diagnosis not present

## 2020-06-21 ENCOUNTER — Encounter: Payer: Self-pay | Admitting: Family Medicine

## 2020-06-22 ENCOUNTER — Other Ambulatory Visit: Payer: Self-pay | Admitting: Family Medicine

## 2020-06-22 DIAGNOSIS — K9429 Other complications of gastrostomy: Secondary | ICD-10-CM

## 2020-06-22 MED ORDER — SILVER SULFADIAZINE 1 % EX CREA: 1.0000 "application " | TOPICAL_CREAM | Freq: Every day | CUTANEOUS | 0 refills | Status: DC

## 2020-06-27 ENCOUNTER — Other Ambulatory Visit (INDEPENDENT_AMBULATORY_CARE_PROVIDER_SITE_OTHER): Payer: Self-pay | Admitting: Pediatrics

## 2020-06-27 DIAGNOSIS — G40309 Generalized idiopathic epilepsy and epileptic syndromes, not intractable, without status epilepticus: Secondary | ICD-10-CM

## 2020-06-27 NOTE — Telephone Encounter (Signed)
Prescriptions have been electronically sent.

## 2020-06-27 NOTE — Telephone Encounter (Signed)
Please send to the pharmacy °

## 2020-06-28 ENCOUNTER — Encounter (INDEPENDENT_AMBULATORY_CARE_PROVIDER_SITE_OTHER): Payer: Self-pay | Admitting: Pediatrics

## 2020-06-28 ENCOUNTER — Ambulatory Visit (INDEPENDENT_AMBULATORY_CARE_PROVIDER_SITE_OTHER): Payer: Medicaid Other | Admitting: Pediatrics

## 2020-06-28 ENCOUNTER — Other Ambulatory Visit: Payer: Self-pay

## 2020-06-28 DIAGNOSIS — T85733A Infection and inflammatory reaction due to implanted electronic neurostimulator of spinal cord, electrode (lead), initial encounter: Secondary | ICD-10-CM | POA: Diagnosis not present

## 2020-06-28 DIAGNOSIS — G8110 Spastic hemiplegia affecting unspecified side: Secondary | ICD-10-CM

## 2020-06-28 DIAGNOSIS — S06360A Traumatic hemorrhage of cerebrum, unspecified, without loss of consciousness, initial encounter: Secondary | ICD-10-CM | POA: Diagnosis not present

## 2020-06-28 DIAGNOSIS — S06360D Traumatic hemorrhage of cerebrum, unspecified, without loss of consciousness, subsequent encounter: Secondary | ICD-10-CM | POA: Diagnosis not present

## 2020-06-28 DIAGNOSIS — G911 Obstructive hydrocephalus: Secondary | ICD-10-CM | POA: Diagnosis not present

## 2020-06-28 DIAGNOSIS — S069X9S Unspecified intracranial injury with loss of consciousness of unspecified duration, sequela: Secondary | ICD-10-CM

## 2020-06-28 DIAGNOSIS — G40309 Generalized idiopathic epilepsy and epileptic syndromes, not intractable, without status epilepticus: Secondary | ICD-10-CM
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
## 2020-06-28 NOTE — Patient Instructions (Signed)
Will see him in May, 5th, 2022.

## 2020-06-30 ENCOUNTER — Emergency Department (HOSPITAL_COMMUNITY): Payer: Medicaid Other

## 2020-06-30 ENCOUNTER — Emergency Department (HOSPITAL_COMMUNITY)
Admission: EM | Admit: 2020-06-30 | Discharge: 2020-06-30 | Disposition: A | Discharge status: Home / Self Care | Payer: Medicaid Other | Attending: Emergency Medicine | Admitting: Emergency Medicine

## 2020-06-30 ENCOUNTER — Encounter (INDEPENDENT_AMBULATORY_CARE_PROVIDER_SITE_OTHER): Payer: Self-pay | Admitting: Family

## 2020-06-30 ENCOUNTER — Encounter (HOSPITAL_COMMUNITY): Payer: Self-pay | Admitting: Emergency Medicine

## 2020-06-30 ENCOUNTER — Telehealth (INDEPENDENT_AMBULATORY_CARE_PROVIDER_SITE_OTHER): Payer: Self-pay | Admitting: Family

## 2020-06-30 DIAGNOSIS — R5383 Other fatigue: Secondary | ICD-10-CM | POA: Diagnosis not present

## 2020-06-30 DIAGNOSIS — X31XXXA Exposure to excessive natural cold, initial encounter: Secondary | ICD-10-CM | POA: Diagnosis not present

## 2020-06-30 DIAGNOSIS — R001 Bradycardia, unspecified: Secondary | ICD-10-CM

## 2020-06-30 DIAGNOSIS — T68XXXA Hypothermia, initial encounter: Secondary | ICD-10-CM | POA: Diagnosis not present

## 2020-06-30 DIAGNOSIS — Z978 Presence of other specified devices: Secondary | ICD-10-CM | POA: Diagnosis not present

## 2020-06-30 HISTORY — DX: Unspecified intracranial injury with loss of consciousness of unspecified duration, initial encounter: S06.9X9A

## 2020-06-30 HISTORY — DX: Unspecified intracranial injury with loss of consciousness status unknown, initial encounter: S06.9XAA

## 2020-06-30 LAB — CBC WITH DIFFERENTIAL/PLATELET
Abs Immature Granulocytes: 0.02 10*3/uL (ref 0.00–0.07)
Basophils Absolute: 0 10*3/uL (ref 0.0–0.1)
Basophils Relative: 1 %
Eosinophils Absolute: 0.2 10*3/uL (ref 0.0–0.5)
Eosinophils Relative: 3 %
HCT: 45.2 % (ref 39.0–52.0)
Hemoglobin: 15 g/dL (ref 13.0–17.0)
Immature Granulocytes: 0 %
Lymphocytes Relative: 23 %
Lymphs Abs: 1.4 10*3/uL (ref 0.7–4.0)
MCH: 32.6 pg (ref 26.0–34.0)
MCHC: 33.2 g/dL (ref 30.0–36.0)
MCV: 98.3 fL (ref 80.0–100.0)
Monocytes Absolute: 0.9 10*3/uL (ref 0.1–1.0)
Monocytes Relative: 15 %
Neutro Abs: 3.6 10*3/uL (ref 1.7–7.7)
Neutrophils Relative %: 58 %
Platelets: 178 10*3/uL (ref 150–400)
RBC: 4.6 MIL/uL (ref 4.22–5.81)
RDW: 14.5 % (ref 11.5–15.5)
WBC: 6.2 10*3/uL (ref 4.0–10.5)
nRBC: 0 % (ref 0.0–0.2)

## 2020-06-30 LAB — COMPREHENSIVE METABOLIC PANEL
ALT: 27 U/L (ref 0–44)
AST: 14 U/L — ABNORMAL LOW (ref 15–41)
Albumin: 3.5 g/dL (ref 3.5–5.0)
Alkaline Phosphatase: 100 U/L (ref 38–126)
Anion gap: 7 (ref 5–15)
BUN: 12 mg/dL (ref 6–20)
CO2: 30 mmol/L (ref 22–32)
Calcium: 8.9 mg/dL (ref 8.9–10.3)
Chloride: 99 mmol/L (ref 98–111)
Creatinine, Ser: 0.43 mg/dL — ABNORMAL LOW (ref 0.61–1.24)
GFR, Estimated: >60 mL/min (ref >60)
Glucose, Bld: 126 mg/dL — ABNORMAL HIGH (ref 70–99)
Potassium: 4.3 mmol/L (ref 3.5–5.1)
Sodium: 136 mmol/L (ref 135–145)
Total Bilirubin: 0.6 mg/dL (ref 0.3–1.2)
Total Protein: 7.1 g/dL (ref 6.5–8.1)

## 2020-06-30 LAB — URINALYSIS, ROUTINE W REFLEX MICROSCOPIC
Bilirubin Urine: NEGATIVE
Glucose, UA: 50 mg/dL — AB
Hgb urine dipstick: NEGATIVE
Ketones, ur: NEGATIVE mg/dL
Leukocytes,Ua: NEGATIVE
Nitrite: NEGATIVE
Protein, ur: NEGATIVE mg/dL
Specific Gravity, Urine: 1.01 (ref 1.005–1.030)
pH: 6 (ref 5.0–8.0)

## 2020-06-30 LAB — LACTIC ACID, PLASMA: Lactic Acid, Venous: 1.9 mmol/L (ref 0.5–1.9)

## 2020-06-30 MED ORDER — SODIUM CHLORIDE 0.9 % IV BOLUS: 1000.0000 mL | Freq: Once | INTRAVENOUS | Status: DC

## 2020-06-30 NOTE — ED Notes (Signed)
Not able to draw labs MD will try with ultra sound.
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
## 2020-06-30 NOTE — ED Notes (Signed)
Assisted pt's mother in getting pt back into pt's wheelchair from home.

## 2020-06-30 NOTE — Progress Notes (Signed)
Attempt at PIV unsuccessful.  Both upper and lower arms assessed with ultrasound showing no veins usable for PIV or midline catheter.  No or very small surface veins noted.  Attempt x 1 with small catheter to left hand unsuccessful.

## 2020-06-30 NOTE — Telephone Encounter (Signed)
I received a call from [facility] [employer] regarding Scott Bean being excessively sleepy. I called Mom [name] as requested and she said that Scott Bean has been unusually sleepy since the Baclofen pump refill on Thursday 06/28/20. She said that she had to get him up early that day to go in for pump refill so when he was sleepy that afternoon that she attributed it to that. Then he was sleepy yesterday and more sleepy today. She said that he barely awakens with care and that he looks groggy. Mom put on a fitness watch on him which listed his BP as 108/70, sats 96% and heart rate 42. I consulted with Dr Moody Bruins who recommended that Scott Bean go to ER for evaluation. I called Mom back and gave her that instruction. Mom agreed with this plan. TG

## 2020-06-30 NOTE — ED Notes (Addendum)
IV team unsuccessful, MD notfied.  Unable to obtain labs at this time, MD aware. Pt's mother states that pt is acting normal, she does not want patient to have CT scans done. Patient's neurologist at bedside.Pt incontinent, pt cleaned and linens changed.

## 2020-06-30 NOTE — Progress Notes (Signed)
I received a call from ED [place]. Mother brought him to ED concerning of sleepiness since Thursday. He had baclofen bump programing and refill on 06/28/20. Mother reported that he woke up early on Thursday, and had his bump refilled at 11 am and went home.  He slept all day, and looked tired. We have noticed that [name] was calm during baclofen pump refilling. He is usually move or make some sounds. On Friday, he was sleepy. Today, his mother went to church this moring and returned home and found he was still sleepy. Mother used smart watch, and his heart rate was low 40-50. His heart rate ranges 60-70 when he gets fussy in doctors office. Mother states that his body tone is not floppy concerning for baclofen toxicity.  He has history of high carbamazepine level for which his dose decreased.  Mother denied any fever, cough, vomiting or diarrhea.   Medications: AEDs.  Keppra 2.5 teaspoon twice a day.  Tegretol 16.5 ml q 6 hour.   In ED, Today's Vitals   06/30/20 1845 06/30/20 1856 06/30/20 1900 06/30/20 1915  BP: (!) 119/93  (!) 122/93 127/63  Pulse: (!) 45  (!) 43 (!) 43  Resp: 12     Temp:  (!) 95.3 F (35.2 C)    TempSrc:  Rectal    SpO2: 92%  94% 93%   Over time and after inserting needle to get blood work. [name] is noted to be more awake at bedside. Eyes are open, smiling and giggling. His mother states that he is at his baseline. He is able to resist his mother pushing his legs.   CBC Latest Ref Rng & Units 06/30/2020 05/18/2020 09/02/2019  WBC 4.0 - 10.5 K/uL 6.2 7.4 7.3  Hemoglobin 13.0 - 17.0 g/dL 17.4 08.1 44.8  Hematocrit 39.0 - 52.0 % 45.2 40.1 46.8  Platelets 150 - 400 K/uL 178 225 251   CMP Latest Ref Rng & Units 06/30/2020 05/18/2020 09/02/2019  Glucose 70 - 99 mg/dL 185(U) 97 97  BUN 6 - 20 mg/dL 12 6 9   Creatinine 0.61 - 1.24 mg/dL ) 3.14(H) 7.02(O)  Sodium 135 - 145 mmol/L 136 133(L) 137  Potassium 3.5 - 5.1 mmol/L 4.3 4.0 4.0  Chloride 98 - 111 mmol/L 99 92(L) 100   CO2 22 - 32 mmol/L 30 24 28   Calcium 8.9 - 10.3 mg/dL 8.9 9.4 3.78(H)  Total Protein 6.5 - 8.1 g/dL 7.1 7.4 -  Total Bilirubin 0.3 - 1.2 mg/dL 0.6 -  Alkaline Phos 38 - 126 U/L 100 189(H) -  AST 15 - 41 U/L 14(L) 25 -  ALT 0 - 44 U/L 27 79(H) -    After discussing with Dr 8.8(F. This would be unlikely baclofen toxicity giving that he is awaken now, and at his baseline. Dr <0.2 spoke with mother on the phone and explained that to mother. She also does not think is related to baclofen bump. Mother does not want to decrease the baclofen rate as she is worrying about pain from spasticity and dystonia.  Will monitor clinically and pending blood work results.  Rest of management per ED team.  Sharene Skeans, MD

## 2020-06-30 NOTE — ED Triage Notes (Signed)
Unable to get good temp at triage.

## 2020-06-30 NOTE — ED Provider Notes (Signed)
[facility] EMERGENCY DEPARTMENT Provider Note   CSN: [id number] Arrival date & time: 06/30/20  1513     History Chief Complaint  Patient presents with  . Fatigue  . Bradycardia    Scott Bean is a 30 y.o. male.  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  Mom had reported that he had been sleepy since his baclofen pump refill on Thursday, and that she has been putting a fitness watch on him to monitor his heart rate and found it to be as low as 42.  Reports he was in normal state of health the AM of his appointment for baclofen refill.  He was sleepy following the appointmetn but thought it was because he woke up early, the next day was also sleepy but thought it could be weather.  Today he continued to appear sleepy and resting heart rate low and she called office who recommended he come to the ED.    Denies other changes, no vomiting, no diarrhea, no skin changes, no fevers, no cough.  Tegretol was decreased recently but no seizures.  On cream for irritation around GT  Past Medical History:  Diagnosis Date  . Bacteremia 12/10/2011  . Cellulitis 12/27/2012  . 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 PNA (pneumonia) 12/09/2011  . Traumatic brain injury [facility])     Patient Active Problem List   Diagnosis Date Noted  . Transient alteration of awareness 03/18/2019  . Spastic hemiplegia affecting nondominant side (HCC) 08/10/2012  . Spastic hemiplegia affecting dominant side (HCC) 08/10/2012  . 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 Encounter for long-term (current) use of other medications 08/10/2012  . H/O gastrointestinal disease 05/15/2011  . Allergic  state 05/15/2011  . Obstructive hydrocephalus (HCC) 05/14/2011  . Closed skull fracture with intracranial injury, with loss of consciousness (HCC) 05/14/2011  . Cognitive decline 05/14/2011  . Hydrocephalus (HCC) 05/14/2011  . Traumatic brain injury with loss of consciousness (HCC) 05/14/2011    Past Surgical History:  Procedure Laterality Date  . decompressive craniotomy    . implanation of intrathecal baclofen pump    . PEG TUBE PLACEMENT    . reimplantation of intrathecal baclofen pump    . VENTRICULOPERITONEAL SHUNT         Family History  Problem Relation Age of Onset  . Heart Problems Maternal Grandfather        Died at 46  . Lung cancer Maternal Grandmother        Died at 74  . Liver cancer Maternal Grandmother     Social History   Tobacco Use  . Smoking status: Never Smoker  . Smokeless tobacco: Never Used  Vaping Use  . Vaping Use: Never used  Substance Use Topics  . Alcohol use: No  . Drug use: No    Home Medications Prior to Admission medications   Medication Sig Start Date End Date Taking? Authorizing Provider  ascorbic acid (VITAMIN C) 1000 MG tablet 1,000 mg by Per G Tube route daily.    [provider]  carbamazepine (TEGRETOL) 100 MG chewable tablet CRUSH AND GIVE AS DIRECTED. 3.5 TABLETS IN THE MORNING AND BEDTIME, 3 AT [facility] AND DINNER 02/28/19   Deetta Perla, MD  cetirizine HCl (ZYRTEC) 5 MG/5ML  SOLN Take 5 mLs (5 mg total) by mouth daily. 03/06/20   Raliegh Ip, DO  clotrimazole-betamethasone (LOTRISONE) cream Apply topically 2 (two) times daily as needed. x7-10 days (NOT TO FACE) 11/30/19   Gottschalk, Kathie Rhodes M, DO  fluticasone (FLONASE) 50 MCG/ACT nasal spray USE 2 SPRAYS IN EACH NOSTRIL ONCE DAILY. 05/25/20   Raliegh Ip, DO  Lactobacillus Rhamnosus, GG, (RA PROBIOTIC DIGESTIVE CARE) CAPS Take 1 capsule by mouth daily.    [provider]  Lactulose 20 GM/30ML SOLN Place 15 mLs (10 g total) into feeding tube 3  (three) times daily as needed (constipation). 05/18/20   Raliegh Ip, DO  levETIRAcetam (KEPPRA) 100 MG/ML solution TAKE 2.5 TEASPOONS TWICE A DAY 06/27/20   Deetta Perla, MD  Omega-3 Fatty Acids (FISH OIL) 1200 MG CAPS Take by mouth.    [provider]  polyethylene glycol powder (GLYCOLAX/MIRALAX) 17 GM/SCOOP powder 17 g by Per G Tube route daily as needed for Constipation.    [provider]  silver sulfADIAZINE (SILVADENE) 1 % cream Apply 1 application topically daily. 06/22/20   Delynn Flavin M, DO  TEGRETOL 100 MG/5ML suspension TAKE 16.5 ML EVERY 6 HOURS 06/27/20   Deetta Perla, MD    Allergies    Patient has no known allergies.  Review of Systems   Review of Systems  Unable to perform ROS: Patient nonverbal  Constitutional: Positive for fatigue. Negative for fever.  Respiratory: Negative for cough.   Gastrointestinal: Negative for diarrhea, nausea and vomiting.  Skin: Positive for rash (erythema around GT location). Negative for wound.    Physical Exam Updated Vital Signs BP 130/69   Pulse 64   Temp (!) 95.3 F (35.2 C) (Rectal)   Resp 18   SpO2 97%   Physical Exam Vitals and nursing note reviewed.  Constitutional:      General: He is not in acute distress.    Appearance: He is well-developed. He is not diaphoretic.  HENT:     Head: Normocephalic and atraumatic.  Eyes:     Conjunctiva/sclera: Conjunctivae normal.  Cardiovascular:     Rate and Rhythm: Regular rhythm. Bradycardia present.     Heart sounds: Normal heart sounds. No murmur heard. No friction rub. No gallop.   Pulmonary:     Effort: Pulmonary effort is normal. No respiratory distress.     Breath sounds: Normal breath sounds. No wheezing or rales.  Abdominal:     General: There is no distension.     Palpations: Abdomen is soft.     Tenderness: There is no abdominal tenderness. There is no guarding.     Comments: Erythema around GT  Musculoskeletal:      Cervical back: Normal range of motion.  Skin:    General: Skin is warm and dry.  Neurological:     Mental Status: He is alert.     Comments: Spastic quadriplegia Sleepy on initial eval, reeval opening eyes spontaneously, looking around     ED Results / Procedures / Treatments   Labs (all labs ordered are listed, but only abnormal results are displayed) Labs Reviewed  COMPREHENSIVE METABOLIC PANEL - Abnormal; Notable for the following components:      Result Value   Glucose, Bld 126 (*)    Creatinine, Ser 0.43 (*)    AST 14 (*)    All other components within normal limits  URINALYSIS, ROUTINE W REFLEX MICROSCOPIC - Abnormal; Notable for the following components:   APPearance HAZY (*)  Glucose, UA 50 (*)    All other components within normal limits  CULTURE, BLOOD (ROUTINE X 2)  CULTURE, BLOOD (ROUTINE X 2)  URINE CULTURE  LACTIC ACID, PLASMA  CBC WITH DIFFERENTIAL/PLATELET    EKG EKG Interpretation  Date/Time:  Saturday June 30 2020 17:02:54 EDT Ventricular Rate:  47 PR Interval:    QRS Duration: 95 QT Interval:  473 QTC Calculation: 419 R Axis:   42 Text Interpretation: Sinus bradycardia ST elev, probable normal early repol pattern No significant change since last tracing Confirmed by Alvira Monday ([id number]) on 06/30/2020 5:40:59 PM   Radiology DG Chest 2 View  Result Date: 06/30/2020 CLINICAL DATA:  Fatigue and bradycardia. EXAM: CHEST - 2 VIEW COMPARISON:  None. FINDINGS: The cardiomediastinal silhouette is unremarkable. There is no evidence of focal airspace disease, pulmonary edema, suspicious pulmonary nodule/mass, pleural effusion, or pneumothorax. No acute bony abnormalities are identified. IMPRESSION: No active cardiopulmonary disease. Electronically Signed   By: Harmon Pier M.D.   On: 06/30/2020 16:08    Procedures Procedures   Medications Ordered in ED Medications - No data to display  ED Course  I have reviewed the triage vital signs and the  nursing notes.  Pertinent labs & imaging results that were available during my care of the patient were reviewed by me and considered in my medical decision making (see chart for details).    MDM Rules/Calculators/A&P                          30 year old male with history of traumatic brain injury with obstructive hydrocephalus requiring decompressive craniotomy, spastic hemiplegia, generalized convulsive epilepsy, VP shunt no longer functional for last 47yr per mom, intrathecal baclofen pump, PEG tube in place who presents with concern for fatigue after having baclofen pump refill.  DDx includes baclofen pump malfunction/baclofen toxicity, hypothyroidism, ICH, sepsis, other toxic/metabolic abnormality.  Has not had seizure activity and feel nonconvulsive seizures and post ictal periods less likely.   Initial labs show no leukocytosis, no anemia, no significant electrolyte abnormalities, normal lactic acid, no sign of UTI. CXR without abnormalities.  Ordered additional testing to evaluate thyroid function, seizure medication levels, blood cultures as well as CT head and CT abdomen given history limited, erythema around GT site and question of tenderness, however prior to obtaining these studies his mother reports he has returned to baseline and declines further testing. Did discuss that conservative plan would be to obtain testing as initially discussed however she would like to clinically observe.  Discussed that hypothermia can be associated with sepsis or hypothyroidism however I do not see other signs of sepsis and in discussion with pediatric neurology hypothermia is also observed in brain injured patients.  Consulted pediatric Neurology and Dr. Moody Bruins came to the ED for evaluation and discussed patient with his primary neurologist Dr. Sharene Skeans.  During his time in the ED his HR and mental status have improved and he was observed at his neurologic baseline. Mother is requesting taking him  home without other testing and to follow up with Pediatric Neurology.  Final Clinical Impression(s) / ED Diagnoses Final diagnoses:  Sinus bradycardia  Hypothermia, initial encounter  Other fatigue  Presence of intrathecal baclofen pump    Rx / DC Orders ED Discharge Orders    None       Alvira Monday, MD 07/02/20 1052

## 2020-07-02 ENCOUNTER — Encounter (INDEPENDENT_AMBULATORY_CARE_PROVIDER_SITE_OTHER): Payer: Self-pay

## 2020-07-02 ENCOUNTER — Telehealth: Payer: Self-pay | Admitting: *Deleted

## 2020-07-02 ENCOUNTER — Ambulatory Visit (INDEPENDENT_AMBULATORY_CARE_PROVIDER_SITE_OTHER): Payer: Medicaid Other | Admitting: Family Medicine

## 2020-07-02 VITALS — HR 61 | Temp 96.0°F

## 2020-07-02 DIAGNOSIS — B952 Enterococcus as the cause of diseases classified elsewhere: Secondary | ICD-10-CM

## 2020-07-02 DIAGNOSIS — R739 Hyperglycemia, unspecified: Secondary | ICD-10-CM | POA: Diagnosis not present

## 2020-07-02 DIAGNOSIS — N390 Urinary tract infection, site not specified: Secondary | ICD-10-CM

## 2020-07-02 DIAGNOSIS — R400 Somnolence: Secondary | ICD-10-CM | POA: Diagnosis not present

## 2020-07-02 DIAGNOSIS — R001 Bradycardia, unspecified: Secondary | ICD-10-CM | POA: Diagnosis not present

## 2020-07-02 LAB — BAYER DCA HB A1C WAIVED: HB A1C (BAYER DCA - WAIVED): 4.8 % (ref <7.0)

## 2020-07-02 MED ORDER — AMOXICILLIN 500 MG PO CAPS: 500.0000 mg | ORAL_CAPSULE | Freq: Three times a day (TID) | ORAL | 0 refills | Status: DC

## 2020-07-02 NOTE — Progress Notes (Signed)
Telephone visit  Subjective: CC: bradycardia PCP: Raliegh Ip, DO [id number] Scott Bean is a 30 y.o. male calls for telephone consult today. Patient provides verbal consent for consult held via phone.  Due to COVID-19 pandemic this visit was conducted virtually. This visit type was conducted due to national recommendations for restrictions regarding the COVID-19 Pandemic (e.g. social distancing, sheltering in place) in an effort to limit this patient's exposure and mitigate transmission in our community. All issues noted in this document were discussed and addressed.  A physical exam was not performed with this format.   Location of patient: home Location of provider: [facility] Others present for call: mother  1. Excessive sleepiness Patient was seen in ED on 3/26 for this.  Concern that possibly baclofen contributing but neuro did not feel this was related.  His vitals showed bradycardia and low temperature. Neurology saw him in ED, EKG was ok.  Labs were unremarkable so he was discharged home.   Tube seems to be getting better.  He did have a leak recently.   His energy seems better today.  Still has some bradycardia from 50-60s.     ROS: Per HPI  No Known Allergies Past Medical History:  Diagnosis Date  . Bacteremia 12/10/2011  . Cellulitis 12/27/2012  . 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 PNA (pneumonia) 12/09/2011  . Traumatic brain injury [facility])     Current Outpatient Medications:  .  ascorbic acid (VITAMIN Scott) 1000 MG tablet, 1,000 mg by Per G Tube route daily., Disp: , Rfl:  .  carbamazepine (TEGRETOL) 100 MG chewable tablet, CRUSH AND GIVE AS DIRECTED. 3.5 TABLETS IN THE MORNING AND BEDTIME, 3 AT NOON AND DINNER, Disp: 39 tablet, Rfl: 5 .  cetirizine HCl (ZYRTEC) 5 MG/5ML SOLN, Take 5 mLs (5 mg total) by mouth daily., Disp: 150 mL, Rfl: prn .  clotrimazole-betamethasone (LOTRISONE) cream, Apply topically 2  (two) times daily as needed. x7-10 days (NOT TO FACE), Disp: 30 g, Rfl: 1 .  fluticasone (FLONASE) 50 MCG/ACT nasal spray, USE 2 SPRAYS IN EACH NOSTRIL ONCE DAILY., Disp: 16 g, Rfl: 5 .  Lactobacillus Rhamnosus, GG, (RA PROBIOTIC DIGESTIVE CARE) CAPS, Take 1 capsule by mouth daily., Disp: , Rfl:  .  Lactulose 20 GM/30ML SOLN, Place 15 mLs (10 g total) into feeding tube 3 (three) times daily as needed (constipation)., Disp: 450 mL, Rfl: prn .  levETIRAcetam (KEPPRA) 100 MG/ML solution, TAKE 2.5 TEASPOONS TWICE A DAY, Disp: 800 mL, Rfl: 5 .  Omega-3 Fatty Acids (FISH OIL) 1200 MG CAPS, Take by mouth., Disp: , Rfl:  .  polyethylene glycol powder (GLYCOLAX/MIRALAX) 17 GM/SCOOP powder, 17 g by Per G Tube route daily as needed for Constipation., Disp: , Rfl:  .  silver sulfADIAZINE (SILVADENE) 1 % cream, Apply 1 application topically daily., Disp: 50 g, Rfl: 0 .  TEGRETOL 100 MG/5ML suspension, TAKE 16.5 ML EVERY 6 HOURS, Disp: 2250 mL, Rfl: 5  Assessment/ Plan: 30 y.o. male   Bradycardia - Plan: TSH, T4, Free, Magnesium  Sleepiness - Plan: TSH, T4, Free, Magnesium  Elevated serum glucose - Plan: Bayer DCA Hb A1c Waived  Urinary tract infection due to Enterococcus - Plan: amoxicillin (AMOXIL) 500 MG capsule  I reviewed his ED notes, EKG result, lab results and urine culture.   Check TSH, FT4 and Mg.  I suspect symptoms are due to UTI.  Amoxicillin sent for Enterococcus Faecalis UTI.  Will await urine  sensitivities.   Start time: 4:22pm End time: 4:35pm  Total time spent on patient care (including telephone call/ virtual visit): 13 minutes  Zamiyah Resendes Hulen Skains, DO [facility] [phone]

## 2020-07-02 NOTE — Telephone Encounter (Signed)
Transition Care Management Unsuccessful Follow-up Telephone Call  Date of discharge and from where:  06/30/2020 [facility]  Attempts:  1st Attempt  Reason for unsuccessful TCM follow-up call:  Left voice message

## 2020-07-02 NOTE — Addendum Note (Signed)
Addended by: Margorie John on: 07/02/2020 04:52 PM   Modules accepted: Orders

## 2020-07-03 ENCOUNTER — Other Ambulatory Visit: Payer: Self-pay | Admitting: Family Medicine

## 2020-07-03 ENCOUNTER — Telehealth: Payer: Self-pay | Admitting: *Deleted

## 2020-07-03 ENCOUNTER — Encounter: Payer: Self-pay | Admitting: Family Medicine

## 2020-07-03 DIAGNOSIS — R7989 Other specified abnormal findings of blood chemistry: Secondary | ICD-10-CM

## 2020-07-03 LAB — URINE CULTURE: Culture: >=100000 — AB

## 2020-07-03 LAB — TSH: TSH: 1.16 u[IU]/mL (ref 0.450–4.500)

## 2020-07-03 LAB — MAGNESIUM: Magnesium: 2.2 mg/dL (ref 1.6–2.3)

## 2020-07-03 LAB — T4, FREE: Free T4: 0.75 ng/dL — ABNORMAL LOW (ref 0.82–1.77)

## 2020-07-03 NOTE — Telephone Encounter (Signed)
Saw PCP on 07/02/2020.

## 2020-07-03 NOTE — Telephone Encounter (Signed)
-----   Message from Raliegh Ip, DO sent at 07/03/2020 12:29 PM EDT ----- Please inform mother that his urine culture from ED was determined to be SENSITIVE to the antibiotic I prescribed.  No changes are needed.  Future orders for thyroid labs in 2 weeks have been placed.

## 2020-07-03 NOTE — Telephone Encounter (Signed)
Mother aware and verbalizes understanding.  

## 2020-07-06 ENCOUNTER — Encounter: Payer: Self-pay | Admitting: Family Medicine

## 2020-07-09 ENCOUNTER — Other Ambulatory Visit: Payer: Self-pay | Admitting: Family Medicine

## 2020-07-09 ENCOUNTER — Encounter: Payer: Self-pay | Admitting: Family Medicine

## 2020-07-09 DIAGNOSIS — B952 Enterococcus as the cause of diseases classified elsewhere: Secondary | ICD-10-CM

## 2020-07-09 DIAGNOSIS — N390 Urinary tract infection, site not specified: Secondary | ICD-10-CM

## 2020-07-09 MED ORDER — AMOXICILLIN 500 MG PO CAPS: 500.0000 mg | ORAL_CAPSULE | Freq: Three times a day (TID) | ORAL | 0 refills | Status: DC

## 2020-07-13 DIAGNOSIS — G911 Obstructive hydrocephalus: Secondary | ICD-10-CM | POA: Diagnosis not present

## 2020-07-13 DIAGNOSIS — S06360D Traumatic hemorrhage of cerebrum, unspecified, without loss of consciousness, subsequent encounter: Secondary | ICD-10-CM | POA: Diagnosis not present

## 2020-07-13 DIAGNOSIS — S06360A Traumatic hemorrhage of cerebrum, unspecified, without loss of consciousness, initial encounter: Secondary | ICD-10-CM | POA: Diagnosis not present

## 2020-07-13 DIAGNOSIS — T85733A Infection and inflammatory reaction due to implanted electronic neurostimulator of spinal cord, electrode (lead), initial encounter: Secondary | ICD-10-CM | POA: Diagnosis not present

## 2020-07-15 ENCOUNTER — Encounter: Payer: Self-pay | Admitting: Family Medicine

## 2020-07-18 ENCOUNTER — Encounter: Payer: Self-pay | Admitting: Family Medicine

## 2020-07-18 ENCOUNTER — Other Ambulatory Visit: Payer: Medicaid Other

## 2020-07-18 ENCOUNTER — Other Ambulatory Visit: Payer: Self-pay | Admitting: *Deleted

## 2020-07-18 ENCOUNTER — Other Ambulatory Visit: Payer: Self-pay

## 2020-07-18 DIAGNOSIS — R7989 Other specified abnormal findings of blood chemistry: Secondary | ICD-10-CM

## 2020-07-18 DIAGNOSIS — Z87440 Personal history of urinary (tract) infections: Secondary | ICD-10-CM

## 2020-07-19 DIAGNOSIS — Z87440 Personal history of urinary (tract) infections: Secondary | ICD-10-CM | POA: Diagnosis not present

## 2020-07-19 LAB — URINALYSIS, COMPLETE
Bilirubin, UA: NEGATIVE
Glucose, UA: NEGATIVE
Ketones, UA: NEGATIVE
Leukocytes,UA: NEGATIVE
Nitrite, UA: NEGATIVE
Protein,UA: NEGATIVE
RBC, UA: NEGATIVE
Specific Gravity, UA: 1.015 (ref 1.005–1.030)
Urobilinogen, Ur: 0.2 mg/dL (ref 0.2–1.0)
pH, UA: 7 (ref 5.0–7.5)

## 2020-07-19 LAB — TSH: TSH: 3.38 u[IU]/mL (ref 0.450–4.500)

## 2020-07-19 LAB — T4, FREE: Free T4: 0.82 ng/dL (ref 0.82–1.77)

## 2020-07-26 DIAGNOSIS — T85733A Infection and inflammatory reaction due to implanted electronic neurostimulator of spinal cord, electrode (lead), initial encounter: Secondary | ICD-10-CM | POA: Diagnosis not present

## 2020-07-26 DIAGNOSIS — S06360A Traumatic hemorrhage of cerebrum, unspecified, without loss of consciousness, initial encounter: Secondary | ICD-10-CM | POA: Diagnosis not present

## 2020-07-26 DIAGNOSIS — S06360D Traumatic hemorrhage of cerebrum, unspecified, without loss of consciousness, subsequent encounter: Secondary | ICD-10-CM | POA: Diagnosis not present

## 2020-07-26 DIAGNOSIS — G911 Obstructive hydrocephalus: Secondary | ICD-10-CM | POA: Diagnosis not present

## 2020-08-09 ENCOUNTER — Other Ambulatory Visit: Payer: Self-pay

## 2020-08-09 ENCOUNTER — Ambulatory Visit (INDEPENDENT_AMBULATORY_CARE_PROVIDER_SITE_OTHER): Payer: Medicaid Other | Admitting: Pediatrics

## 2020-08-09 ENCOUNTER — Encounter (INDEPENDENT_AMBULATORY_CARE_PROVIDER_SITE_OTHER): Payer: Self-pay | Admitting: Pediatrics

## 2020-08-09 DIAGNOSIS — S069X9S Unspecified intracranial injury with loss of consciousness of unspecified duration, sequela: Secondary | ICD-10-CM

## 2020-08-09 DIAGNOSIS — G8110 Spastic hemiplegia affecting unspecified side: Secondary | ICD-10-CM | POA: Diagnosis not present

## 2020-08-09 DIAGNOSIS — G40309 Generalized idiopathic epilepsy and epileptic syndromes, not intractable, without status epilepticus: Secondary | ICD-10-CM
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
## 2020-08-09 NOTE — Patient Instructions (Signed)
See you at 11 AM on June 16th.

## 2020-08-12 ENCOUNTER — Encounter (INDEPENDENT_AMBULATORY_CARE_PROVIDER_SITE_OTHER): Payer: Self-pay

## 2020-08-13 ENCOUNTER — Encounter: Payer: Self-pay | Admitting: Nurse Practitioner

## 2020-08-13 ENCOUNTER — Ambulatory Visit (INDEPENDENT_AMBULATORY_CARE_PROVIDER_SITE_OTHER): Payer: Medicaid Other | Admitting: Nurse Practitioner

## 2020-08-13 ENCOUNTER — Ambulatory Visit (INDEPENDENT_AMBULATORY_CARE_PROVIDER_SITE_OTHER): Payer: Medicaid Other

## 2020-08-13 VITALS — BP 125/87 | HR 80 | Temp 97.2°F

## 2020-08-13 DIAGNOSIS — J069 Acute upper respiratory infection, unspecified: Secondary | ICD-10-CM

## 2020-08-13 DIAGNOSIS — J189 Pneumonia, unspecified organism: Secondary | ICD-10-CM | POA: Diagnosis not present

## 2020-08-13 MED ORDER — DEXTROMETHORPHAN-GUAIFENESIN 10-100 MG/5ML PO SYRP: 5.0000 mL | ORAL_SOLUTION | Freq: Two times a day (BID) | ORAL | 1 refills | Status: DC

## 2020-08-13 NOTE — Assessment & Plan Note (Signed)
Nasal congestion and cough in the last few days.  Patient's mother reports using Mucinex with no therapeutic effect. Chest x-ray completed to rule out pneumonia.  Guaifenesin started for cough and congestion. Follow-up with unresolved symptoms Rx sent to pharmacy.

## 2020-08-13 NOTE — Progress Notes (Signed)
Acute Office Visit  Subjective:    Patient ID: Scott Bean, male    DOB: 01-May-1990, 30 y.o.   MRN: [id number]  Chief Complaint  Patient presents with  . Cough    Cough This is a new problem. The current episode started in the past 7 days. The problem has been unchanged. The problem occurs constantly. The cough is productive of sputum. Associated symptoms include nasal congestion. Pertinent negatives include no chills, ear congestion or fever. Nothing aggravates the symptoms. He has tried OTC cough suppressant for the symptoms. The treatment provided mild relief. There is no history of pneumonia.     Past Medical History:  Diagnosis Date  . Bacteremia 12/10/2011  . Cellulitis 12/27/2012  . 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 PNA (pneumonia) 12/09/2011  . Traumatic brain injury [facility])     Past Surgical History:  Procedure Laterality Date  . decompressive craniotomy    . implanation of intrathecal baclofen pump    . PEG TUBE PLACEMENT    . reimplantation of intrathecal baclofen pump    . VENTRICULOPERITONEAL SHUNT      Family History  Problem Relation Age of Onset  . Heart Problems Maternal Grandfather        Died at 94  . Lung cancer Maternal Grandmother        Died at 23  . Liver cancer Maternal Grandmother     Social History   Socioeconomic History  . Marital status: Single    Spouse name: Not on file  . Number of children: Not on file  . Years of education: Not on file  . Highest education level: Not on file  Occupational History  . Not on file  Tobacco Use  . Smoking status: Never Smoker  . Smokeless tobacco: Never Used  Vaping Use  . Vaping Use: Never used  Substance and Sexual Activity  . Alcohol use: No  . Drug use: No  . Sexual activity: Never  Other Topics Concern  . Not on file  Social History Narrative   Scott Bean is a 30 yo male.   Scott Bean graduated from [employer] in  2016.    He lives with his mother.   Social Determinants of Health   Financial Resource Strain: Not on file  Food Insecurity: Not on file  Transportation Needs: Not on file  Physical Activity: Not on file  Stress: Not on file  Social Connections: Not on file  Intimate Partner Violence: Not on file    Outpatient Medications Prior to Visit  Medication Sig Dispense Refill  . amoxicillin (AMOXIL) 500 MG capsule Take 1 capsule (500 mg total) by mouth 3 (three) times daily. 21 capsule 0  . ascorbic acid (VITAMIN C) 1000 MG tablet 1,000 mg by Per G Tube route daily.    . carbamazepine (TEGRETOL) 100 MG chewable tablet CRUSH AND GIVE AS DIRECTED. 3.5 TABLETS IN THE MORNING AND BEDTIME, 3 AT NOON AND DINNER 39 tablet 5  . cetirizine HCl (ZYRTEC) 5 MG/5ML SOLN Take 5 mLs (5 mg total) by mouth daily. 150 mL prn  . clotrimazole-betamethasone (LOTRISONE) cream Apply topically 2 (two) times daily as needed. x7-10 days (NOT TO FACE) 30 g 1  . fluticasone (FLONASE) 50 MCG/ACT nasal spray USE 2 SPRAYS IN EACH NOSTRIL ONCE DAILY. 16 g 5  . Lactobacillus Rhamnosus, GG, (RA PROBIOTIC DIGESTIVE CARE) CAPS Take 1 capsule by mouth daily.    . Lactulose  20 GM/30ML SOLN Place 15 mLs (10 g total) into feeding tube 3 (three) times daily as needed (constipation). 450 mL prn  . levETIRAcetam (KEPPRA) 100 MG/ML solution TAKE 2.5 TEASPOONS TWICE A DAY 800 mL 5  . Omega-3 Fatty Acids (FISH OIL) 1200 MG CAPS Take by mouth.    . polyethylene glycol powder (GLYCOLAX/MIRALAX) 17 GM/SCOOP powder 17 g by Per G Tube route daily as needed for Constipation.    . silver sulfADIAZINE (SILVADENE) 1 % cream Apply 1 application topically daily. 50 g 0  . TEGRETOL 100 MG/5ML suspension TAKE 16.5 ML EVERY 6 HOURS 2250 mL 5   No facility-administered medications prior to visit.    No Known Allergies  Review of Systems  Constitutional: Negative for chills and fever.  HENT: Positive for congestion.   Respiratory: Positive for  cough.   Gastrointestinal: Negative.   Genitourinary: Negative.   All other systems reviewed and are negative.      Objective:    Physical Exam Vitals and nursing note reviewed. Exam conducted with a chaperone present (Mother).  HENT:     Head: Normocephalic.     Nose: Congestion present.  Eyes:     Conjunctiva/sclera: Conjunctivae normal.  Cardiovascular:     Rate and Rhythm: Normal rate.     Pulses: Normal pulses.     Heart sounds: Normal heart sounds.  Pulmonary:     Effort: Pulmonary effort is normal. No respiratory distress.     Breath sounds: Normal breath sounds. No wheezing.  Abdominal:     General: Bowel sounds are normal.  Skin:    Findings: No rash.  Neurological:     Mental Status: He is alert.  Psychiatric:        Behavior: Behavior normal.     BP 125/87   Pulse 80   Temp (!) 97.2 F (36.2 C) (Temporal)   SpO2 97%  Wt Readings from Last 3 Encounters:  09/02/19 194 lb (88 kg)    Health Maintenance Due  Topic Date Due  . HIV Screening  Never done  . Hepatitis C Screening  Never done  . TETANUS/TDAP  Never done  . COVID-19 Vaccine (3 - Booster for Moderna series) 01/18/2020    There are no preventive care reminders to display for this patient.   Lab Results  Component Value Date   TSH 3.380 07/18/2020   Lab Results  Component Value Date   WBC 6.2 06/30/2020   HGB 15.0 06/30/2020   HCT 45.2 06/30/2020   MCV 98.3 06/30/2020   PLT 178 06/30/2020   Lab Results  Component Value Date   NA 136 06/30/2020   K 4.3 06/30/2020   CO2 30 06/30/2020   GLUCOSE 126 (H) 06/30/2020   BUN 12 06/30/2020   CREATININE 0.43 (L) 06/30/2020   BILITOT 0.6 06/30/2020   ALKPHOS 100 06/30/2020   AST 14 (L) 06/30/2020   ALT 27 06/30/2020   PROT 7.1 06/30/2020   ALBUMIN 3.5 06/30/2020   CALCIUM 8.9 06/30/2020   ANIONGAP 7 06/30/2020   No results found for: CHOL No results found for: HDL No results found for: LDLCALC No results found for: TRIG No  results found for: CHOLHDL Lab Results  Component Value Date   HGBA1C 4.8 07/02/2020       Assessment & Plan:   Problem List Items Addressed This Visit      Respiratory   Upper respiratory infection with cough and congestion - Primary    Nasal congestion and  cough in the last few days.  Patient's mother reports using Mucinex with no therapeutic effect. Chest x-ray completed to rule out pneumonia.  Guaifenesin started for cough and congestion. Follow-up with unresolved symptoms Rx sent to pharmacy.      Relevant Medications   Dextromethorphan-guaiFENesin 10-100 MG/5ML liquid   Other Relevant Orders   DG Chest 1 View       Meds ordered this encounter  Medications  . Dextromethorphan-guaiFENesin 10-100 MG/5ML liquid    Sig: Take 5 mLs by mouth every 12 (twelve) hours.    Dispense:  236 mL    Refill:  1    Order Specific Question:   Supervising Provider    Answer:   Raliegh Ip [[id number]]     Daryll Drown, NP

## 2020-08-14 ENCOUNTER — Telehealth: Payer: Self-pay

## 2020-08-14 ENCOUNTER — Other Ambulatory Visit: Payer: Self-pay | Admitting: Nurse Practitioner

## 2020-08-14 ENCOUNTER — Encounter (INDEPENDENT_AMBULATORY_CARE_PROVIDER_SITE_OTHER): Payer: Self-pay | Admitting: *Deleted

## 2020-08-14 ENCOUNTER — Encounter: Payer: Self-pay | Admitting: Nurse Practitioner

## 2020-08-14 MED ORDER — AZITHROMYCIN 250 MG PO TABS: ORAL_TABLET | ORAL | 0 refills | Status: AC

## 2020-08-14 NOTE — Telephone Encounter (Signed)
Patient's mother is calling requesting CXR results from yesterday - I call [facility] to read as it was not ready yet. Report is in the chart please review and advise what to tell the mother.

## 2020-08-23 DIAGNOSIS — S06360D Traumatic hemorrhage of cerebrum, unspecified, without loss of consciousness, subsequent encounter: Secondary | ICD-10-CM | POA: Diagnosis not present

## 2020-08-23 DIAGNOSIS — T85733A Infection and inflammatory reaction due to implanted electronic neurostimulator of spinal cord, electrode (lead), initial encounter: Secondary | ICD-10-CM | POA: Diagnosis not present

## 2020-08-23 DIAGNOSIS — S06360A Traumatic hemorrhage of cerebrum, unspecified, without loss of consciousness, initial encounter: Secondary | ICD-10-CM | POA: Diagnosis not present

## 2020-08-23 DIAGNOSIS — G911 Obstructive hydrocephalus: Secondary | ICD-10-CM | POA: Diagnosis not present

## 2020-08-28 ENCOUNTER — Encounter: Payer: Self-pay | Admitting: Family Medicine

## 2020-08-28 DIAGNOSIS — T85733A Infection and inflammatory reaction due to implanted electronic neurostimulator of spinal cord, electrode (lead), initial encounter: Secondary | ICD-10-CM | POA: Diagnosis not present

## 2020-08-28 DIAGNOSIS — S06360A Traumatic hemorrhage of cerebrum, unspecified, without loss of consciousness, initial encounter: Secondary | ICD-10-CM | POA: Diagnosis not present

## 2020-08-28 DIAGNOSIS — G911 Obstructive hydrocephalus: Secondary | ICD-10-CM | POA: Diagnosis not present

## 2020-08-28 DIAGNOSIS — S06360D Traumatic hemorrhage of cerebrum, unspecified, without loss of consciousness, subsequent encounter: Secondary | ICD-10-CM | POA: Diagnosis not present

## 2020-08-29 ENCOUNTER — Other Ambulatory Visit: Payer: Self-pay | Admitting: Nurse Practitioner

## 2020-08-29 ENCOUNTER — Ambulatory Visit (INDEPENDENT_AMBULATORY_CARE_PROVIDER_SITE_OTHER): Payer: Medicaid Other

## 2020-08-29 ENCOUNTER — Ambulatory Visit: Payer: Medicaid Other | Admitting: Nurse Practitioner

## 2020-08-29 VITALS — BP 113/75 | HR 78 | Temp 97.8°F

## 2020-08-29 DIAGNOSIS — K9429 Other complications of gastrostomy: Secondary | ICD-10-CM | POA: Diagnosis not present

## 2020-08-29 DIAGNOSIS — J069 Acute upper respiratory infection, unspecified: Secondary | ICD-10-CM | POA: Diagnosis not present

## 2020-08-29 DIAGNOSIS — J189 Pneumonia, unspecified organism: Secondary | ICD-10-CM | POA: Diagnosis not present

## 2020-08-29 DIAGNOSIS — R21 Rash and other nonspecific skin eruption: Secondary | ICD-10-CM | POA: Diagnosis not present

## 2020-08-29 NOTE — Progress Notes (Signed)
Established Patient Office Visit  Subjective:  Patient ID: Scott Bean, male    DOB: 1991/02/05  Age: 30 y.o. MRN: [id number]  CC:  Chief Complaint  Patient presents with  . Pneumonia  . Rash    HPI Scott Bean presents for 2 months follow-up pneumonia.  Patient is not reporting any fever, cough or congestion.  Patient will complete chest x-ray to compare and assess for disease progression.  Concerning patient's G-tube area around skin.  Patient has developed a rash, redness and skin peeling around G-tube.  Skin breakdown and bleeding around G-tube opening.  No signs or symptoms of infection.  Past Medical History:  Diagnosis Date  . Bacteremia 12/10/2011  . Cellulitis 12/27/2012  . 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 PNA (pneumonia) 12/09/2011  . Traumatic brain injury [facility])     Past Surgical History:  Procedure Laterality Date  . decompressive craniotomy    . implanation of intrathecal baclofen pump    . PEG TUBE PLACEMENT    . reimplantation of intrathecal baclofen pump    . VENTRICULOPERITONEAL SHUNT      Family History  Problem Relation Age of Onset  . Heart Problems Maternal Grandfather        Died at 73  . Lung cancer Maternal Grandmother        Died at 51  . Liver cancer Maternal Grandmother     Social History   Socioeconomic History  . Marital status: Single    Spouse name: Not on file  . Number of children: Not on file  . Years of education: Not on file  . Highest education level: Not on file  Occupational History  . Not on file  Tobacco Use  . Smoking status: Never Smoker  . Smokeless tobacco: Never Used  Vaping Use  . Vaping Use: Never used  Substance and Sexual Activity  . Alcohol use: No  . Drug use: No  . Sexual activity: Never  Other Topics Concern  . Not on file  Social History Narrative   Scott Bean is a 30 yo male.   Scott Bean graduated from [employer] in  2016.    He lives with his mother.   Social Determinants of Health   Financial Resource Strain: Not on file  Food Insecurity: Not on file  Transportation Needs: Not on file  Physical Activity: Not on file  Stress: Not on file  Social Connections: Not on file  Intimate Partner Violence: Not on file    Outpatient Medications Prior to Visit  Medication Sig Dispense Refill  . amoxicillin (AMOXIL) 500 MG capsule Take 1 capsule (500 mg total) by mouth 3 (three) times daily. 21 capsule 0  . ascorbic acid (VITAMIN C) 1000 MG tablet 1,000 mg by Per G Tube route daily.    . carbamazepine (TEGRETOL) 100 MG chewable tablet CRUSH AND GIVE AS DIRECTED. 3.5 TABLETS IN THE MORNING AND BEDTIME, 3 AT NOON AND DINNER 39 tablet 5  . cetirizine HCl (ZYRTEC) 5 MG/5ML SOLN Take 5 mLs (5 mg total) by mouth daily. 150 mL prn  . clotrimazole-betamethasone (LOTRISONE) cream Apply topically 2 (two) times daily as needed. x7-10 days (NOT TO FACE) 30 g 1  . Dextromethorphan-guaiFENesin 10-100 MG/5ML liquid Take 5 mLs by mouth every 12 (twelve) hours. 236 mL 1  . fluticasone (FLONASE) 50 MCG/ACT nasal spray USE 2 SPRAYS IN EACH NOSTRIL ONCE DAILY. 16 g 5  . Lactobacillus  Rhamnosus, GG, (RA PROBIOTIC DIGESTIVE CARE) CAPS Take 1 capsule by mouth daily.    . Lactulose 20 GM/30ML SOLN Place 15 mLs (10 g total) into feeding tube 3 (three) times daily as needed (constipation). 450 mL prn  . levETIRAcetam (KEPPRA) 100 MG/ML solution TAKE 2.5 TEASPOONS TWICE A DAY 800 mL 5  . Omega-3 Fatty Acids (FISH OIL) 1200 MG CAPS Take by mouth.    . polyethylene glycol powder (GLYCOLAX/MIRALAX) 17 GM/SCOOP powder 17 g by Per G Tube route daily as needed for Constipation.    . TEGRETOL 100 MG/5ML suspension TAKE 16.5 ML EVERY 6 HOURS 2250 mL 5  . silver sulfADIAZINE (SILVADENE) 1 % cream Apply 1 application topically daily. 50 g 0   No facility-administered medications prior to visit.    No Known Allergies  ROS Review of  Systems    Objective:    Physical Exam  BP 113/75   Pulse 78   Temp 97.8 F (36.6 C) (Temporal)   SpO2 98%  Wt Readings from Last 3 Encounters:  09/02/19 194 lb (88 kg)     Health Maintenance Due  Topic Date Due  . HIV Screening  Never done  . Hepatitis C Screening  Never done  . TETANUS/TDAP  Never done  . COVID-19 Vaccine (3 - Booster for Moderna series) 12/19/2019    There are no preventive care reminders to display for this patient.  Lab Results  Component Value Date   TSH 3.380 07/18/2020   Lab Results  Component Value Date   WBC 6.2 06/30/2020   HGB 15.0 06/30/2020   HCT 45.2 06/30/2020   MCV 98.3 06/30/2020   PLT 178 06/30/2020   Lab Results  Component Value Date   NA 136 06/30/2020   K 4.3 06/30/2020   CO2 30 06/30/2020   GLUCOSE 126 (H) 06/30/2020   BUN 12 06/30/2020   CREATININE 0.43 (L) 06/30/2020   BILITOT 0.6 06/30/2020   ALKPHOS 100 06/30/2020   AST 14 (L) 06/30/2020   ALT 27 06/30/2020   PROT 7.1 06/30/2020   ALBUMIN 3.5 06/30/2020   CALCIUM 8.9 06/30/2020   ANIONGAP 7 06/30/2020   No results found for: CHOL No results found for: HDL No results found for: LDLCALC No results found for: TRIG No results found for: [facility] Lab Results  Component Value Date   HGBA1C 4.8 07/02/2020      Assessment & Plan:   Problem List Items Addressed This Visit      Respiratory   Upper respiratory infection with cough and congestion - Primary    Completed chest x-ray- COMPARISON:  Chest radiograph dated 08/13/2020.  FINDINGS: There is mild eventration of the right hemidiaphragm. No focal consolidation, pleural effusion, or pneumothorax. The cardiac silhouette is within normal limits. No acute osseous pathology.  IMPRESSION: No active disease.    Patient's chest x-ray is better and shows no disease progression compared to 08/13/2020  Patient will follow up in 4 weeks repeat chest x-ray.         Other   Irritation around  percutaneous endoscopic gastrostomy (PEG) tube site [facility])    Skin around G-tube is not healing properly.  Provided education to patient's mother to keep G-tube stable.  On assessment excessive movement during cleaning and changing gauze is preventing skin from properly healing.  Showed mother how to pack enough gauze to keep G-tube stable, started triamcinolone cream and Silvadene around skin but not close to the opening of the G-tube to help heal  new skin rash.  Advised mother to follow-up with patient in a week or 2 if bleeding continues.  May need surgical consult if symptoms are worse or not resolved.  Patient's mother verbalized understanding.  Rx sent to pharmacy.      Relevant Medications   triamcinolone cream (KENALOG) 0.1 %   silver sulfADIAZINE (SILVADENE) 1 % cream      Meds ordered this encounter  Medications  . triamcinolone cream (KENALOG) 0.1 %    Sig: Apply 1 application topically 2 (two) times daily.    Dispense:  30 g    Refill:  0    Order Specific Question:   Supervising Provider    Answer:   Raliegh Ip [[id number]]  . silver sulfADIAZINE (SILVADENE) 1 % cream    Sig: Apply 1 application topically daily.    Dispense:  50 g    Refill:  0    Order Specific Question:   Supervising Provider    Answer:   Raliegh Ip [[id number]]    Follow-up: No follow-ups on file.    Daryll Drown, NP

## 2020-08-30 ENCOUNTER — Encounter: Payer: Self-pay | Admitting: Nurse Practitioner

## 2020-08-30 DIAGNOSIS — K9429 Other complications of gastrostomy: Secondary | ICD-10-CM | POA: Insufficient documentation

## 2020-08-30 MED ORDER — SILVER SULFADIAZINE 1 % EX CREA
1.0000 | TOPICAL_CREAM | Freq: Every day | CUTANEOUS | 0 refills | Status: DC
Start: 2020-08-30 — End: 2020-10-16

## 2020-08-30 MED ORDER — TRIAMCINOLONE ACETONIDE 0.1 % EX CREA: 1.0000 "application " | TOPICAL_CREAM | Freq: Two times a day (BID) | CUTANEOUS | 0 refills | Status: DC

## 2020-08-30 NOTE — Assessment & Plan Note (Signed)
Skin around G-tube is not healing properly.  Provided education to patient's mother to keep G-tube stable.  On assessment excessive movement during cleaning and changing gauze is preventing skin from properly healing.  Showed mother how to pack enough gauze to keep G-tube stable, started triamcinolone cream and Silvadene around skin but not close to the opening of the G-tube to help heal new skin rash.  Advised mother to follow-up with patient in a week or 2 if bleeding continues.  May need surgical consult if symptoms are worse or not resolved.  Patient's mother verbalized understanding.  Rx sent to pharmacy.

## 2020-08-30 NOTE — Assessment & Plan Note (Signed)
Completed chest x-ray- COMPARISON:  Chest radiograph dated 08/13/2020.  FINDINGS: There is mild eventration of the right hemidiaphragm. No focal consolidation, pleural effusion, or pneumothorax. The cardiac silhouette is within normal limits. No acute osseous pathology.  IMPRESSION: No active disease.    Patient's chest x-ray is better and shows no disease progression compared to 08/13/2020  Patient will follow up in 4 weeks repeat chest x-ray.

## 2020-09-20 ENCOUNTER — Encounter (INDEPENDENT_AMBULATORY_CARE_PROVIDER_SITE_OTHER): Payer: Self-pay | Admitting: Pediatrics

## 2020-09-20 ENCOUNTER — Ambulatory Visit (INDEPENDENT_AMBULATORY_CARE_PROVIDER_SITE_OTHER): Payer: Medicaid Other | Admitting: Pediatrics

## 2020-09-20 ENCOUNTER — Other Ambulatory Visit: Payer: Self-pay

## 2020-09-20 DIAGNOSIS — G8110 Spastic hemiplegia affecting unspecified side: Secondary | ICD-10-CM
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
## 2020-09-20 NOTE — Patient Instructions (Signed)
We will see you on July 27 at 1:30 PM.

## 2020-09-21 DIAGNOSIS — S06890A Other specified intracranial injury without loss of consciousness, initial encounter: Secondary | ICD-10-CM | POA: Diagnosis not present

## 2020-10-02 DIAGNOSIS — T85733A Infection and inflammatory reaction due to implanted electronic neurostimulator of spinal cord, electrode (lead), initial encounter: Secondary | ICD-10-CM | POA: Diagnosis not present

## 2020-10-02 DIAGNOSIS — S06360A Traumatic hemorrhage of cerebrum, unspecified, without loss of consciousness, initial encounter: Secondary | ICD-10-CM | POA: Diagnosis not present

## 2020-10-02 DIAGNOSIS — G911 Obstructive hydrocephalus: Secondary | ICD-10-CM | POA: Diagnosis not present

## 2020-10-02 DIAGNOSIS — S06360D Traumatic hemorrhage of cerebrum, unspecified, without loss of consciousness, subsequent encounter: Secondary | ICD-10-CM | POA: Diagnosis not present

## 2020-10-04 ENCOUNTER — Ambulatory Visit (INDEPENDENT_AMBULATORY_CARE_PROVIDER_SITE_OTHER): Payer: Medicaid Other

## 2020-10-04 ENCOUNTER — Encounter: Payer: Self-pay | Admitting: Family Medicine

## 2020-10-04 ENCOUNTER — Ambulatory Visit (INDEPENDENT_AMBULATORY_CARE_PROVIDER_SITE_OTHER): Payer: Medicaid Other | Admitting: Family Medicine

## 2020-10-04 VITALS — BP 119/69 | HR 68 | Temp 96.4°F

## 2020-10-04 DIAGNOSIS — R059 Cough, unspecified: Secondary | ICD-10-CM

## 2020-10-04 DIAGNOSIS — R0981 Nasal congestion: Secondary | ICD-10-CM

## 2020-10-04 LAB — VERITOR FLU A/B WAIVED
Influenza A: NEGATIVE
Influenza B: NEGATIVE

## 2020-10-04 NOTE — Progress Notes (Signed)
BP 119/69   Pulse 68   Temp (!) 96.4 F (35.8 C)   SpO2 96%    Subjective:   Patient ID: Scott Bean, male    DOB: 03/02/1991, 30 y.o.   MRN: [id number]  HPI: Scott Bean is a 30 y.o. male presenting on 10/04/2020 for URI (Cough and nasal congestion. Mom states they were told to come back for an xray?)   HPI Patient is coming in today for a problem mother, he is wheelchair-bound.  He has been having cough and congestion has been going on for the past 2 days.  His mother says is worse in the morning.  Wakes up very congested and not able to do his tube feeds because of all the congestion and coughing and that will improve throughout the day but then be worse again the next morning.  She denies of having any fevers or chills or wheezing that she could note although he is wheelchair-bound and nonverbal that does not necessarily obey commands. She is just concerned because she cannot get much of a response does not know how that that his congestion could be because he is nonverbal does not obey commands  Relevant past medical, surgical, family and social history reviewed and updated as indicated. Interim medical history since our last visit reviewed. Allergies and medications reviewed and updated.  Review of Systems  Constitutional:  Negative for chills and fever.  HENT:  Positive for congestion, postnasal drip, rhinorrhea and sinus pressure. Negative for ear discharge, ear pain, sneezing, sore throat and voice change.   Eyes:  Negative for pain, discharge, redness and visual disturbance.  Respiratory:  Positive for cough. Negative for shortness of breath and wheezing.   Cardiovascular:  Negative for chest pain and leg swelling.  Musculoskeletal:  Negative for gait problem.  Skin:  Negative for rash.  All other systems reviewed and are negative.  Per HPI unless specifically indicated above   Allergies as of 10/04/2020   No Known Allergies      Medication List         Accurate as of October 04, 2020  4:55 PM. If you have any questions, ask your nurse or doctor.          ascorbic acid 1000 MG tablet Commonly known as: VITAMIN C 1,000 mg by Per G Tube route daily.   cetirizine HCl 5 MG/5ML Soln Commonly known as: Zyrtec Take 5 mLs (5 mg total) by mouth daily.   clotrimazole-betamethasone cream Commonly known as: LOTRISONE Apply topically 2 (two) times daily as needed. x7-10 days (NOT TO FACE)   Dextromethorphan-guaiFENesin 10-100 MG/5ML liquid Take 5 mLs by mouth every 12 (twelve) hours.   Fish Oil 1200 MG Caps Take by mouth.   fluticasone 50 MCG/ACT nasal spray Commonly known as: FLONASE USE 2 SPRAYS IN EACH NOSTRIL ONCE DAILY.   Lactulose 20 GM/30ML Soln Place 15 mLs (10 g total) into feeding tube 3 (three) times daily as needed (constipation).   levETIRAcetam 100 MG/ML solution Commonly known as: KEPPRA TAKE 2.5 TEASPOONS TWICE A DAY   polyethylene glycol powder 17 GM/SCOOP powder Commonly known as: GLYCOLAX/MIRALAX 17 g by Per G Tube route daily as needed for Constipation.   RA Probiotic Digestive Care Caps Take 1 capsule by mouth daily.   silver sulfADIAZINE 1 % cream Commonly known as: Silvadene Apply 1 application topically daily.   TEGretol 100 MG/5ML suspension Generic drug: carBAMazepine TAKE 16.5 ML EVERY 6 HOURS   triamcinolone cream  0.1 % Commonly known as: KENALOG Apply 1 application topically 2 (two) times daily.         Objective:   BP 119/69   Pulse 68   Temp (!) 96.4 F (35.8 C)   SpO2 96%   Wt Readings from Last 3 Encounters:  09/02/19 194 lb (88 kg)    Physical Exam Vitals and nursing note reviewed.  Constitutional:      General: He is not in acute distress.    Appearance: He is well-developed. He is not diaphoretic.  HENT:     Right Ear: Tympanic membrane, ear canal and external ear normal.     Left Ear: Tympanic membrane, ear canal and external ear normal.     Nose: Mucosal edema and  rhinorrhea present.     Right Sinus: Maxillary sinus tenderness present. No frontal sinus tenderness.     Left Sinus: Maxillary sinus tenderness present. No frontal sinus tenderness.     Mouth/Throat:     Pharynx: Uvula midline. No oropharyngeal exudate or posterior oropharyngeal erythema.     Tonsils: No tonsillar abscesses.  Eyes:     General: No scleral icterus.       Right eye: No discharge.     Conjunctiva/sclera: Conjunctivae normal.     Pupils: Pupils are equal, round, and reactive to light.  Neck:     Thyroid: No thyromegaly.  Cardiovascular:     Rate and Rhythm: Normal rate and regular rhythm.     Heart sounds: Normal heart sounds. No murmur heard. Pulmonary:     Effort: Pulmonary effort is normal. No respiratory distress.     Breath sounds: Rhonchi present. No wheezing or rales.  Musculoskeletal:        General: Normal range of motion.     Cervical back: Neck supple.  Lymphadenopathy:     Cervical: No cervical adenopathy.  Skin:    General: Skin is warm and dry.     Findings: No rash.  Neurological:     Mental Status: He is alert and oriented to person, place, and time.     Coordination: Coordination normal.  Psychiatric:        Behavior: Behavior normal.    Chest x-ray: no acute cardiopulm changes, await radiology read, unchanged from previous  Assessment & Plan:   Problem List Items Addressed This Visit   None Visit Diagnoses     Cough    -  Primary   Relevant Orders   Novel Coronavirus, NAA ([facility])   Veritor Flu A/B Waived   DG Chest 2 View   Nasal congestion       Relevant Orders   Novel Coronavirus, NAA ([facility])   Veritor Flu A/B Waived   DG Chest 2 View       Recommend Flonase in the evening and Benadryl in the evening and Mucinex DM in the evening and call us if it does not improve or worsens. Follow up plan: Return if symptoms worsen or fail to improve.  Counseling provided for all of the vaccine components Orders Placed This Encounter   Procedures   Novel Coronavirus, NAA ([facility])   DG Chest 2 View   Veritor Flu A/B Waived    Arville Care, MD [facility] 10/04/2020, 4:55 PM

## 2020-10-05 LAB — NOVEL CORONAVIRUS, NAA: SARS-CoV-2, NAA: NOT DETECTED

## 2020-10-05 LAB — SARS-COV-2, NAA 2 DAY TAT

## 2020-10-09 ENCOUNTER — Other Ambulatory Visit: Payer: Self-pay | Admitting: Family Medicine

## 2020-10-09 DIAGNOSIS — R0981 Nasal congestion: Secondary | ICD-10-CM

## 2020-10-09 DIAGNOSIS — R059 Cough, unspecified: Secondary | ICD-10-CM

## 2020-10-12 ENCOUNTER — Ambulatory Visit: Payer: Medicaid Other | Admitting: Nurse Practitioner

## 2020-10-16 ENCOUNTER — Other Ambulatory Visit: Payer: Self-pay

## 2020-10-16 ENCOUNTER — Encounter: Payer: Self-pay | Admitting: Physician Assistant

## 2020-10-16 ENCOUNTER — Ambulatory Visit: Payer: Medicaid Other | Admitting: Physician Assistant

## 2020-10-16 ENCOUNTER — Encounter: Payer: Self-pay | Admitting: Family Medicine

## 2020-10-16 VITALS — Temp 98.3°F | Resp 20

## 2020-10-16 DIAGNOSIS — L0291 Cutaneous abscess, unspecified: Secondary | ICD-10-CM | POA: Diagnosis not present

## 2020-10-16 MED ORDER — CEPHALEXIN 250 MG/5ML PO SUSR: 500.0000 mg | Freq: Three times a day (TID) | ORAL | 0 refills | Status: DC

## 2020-10-16 NOTE — Patient Instructions (Signed)
Skin Abscess  A skin abscess is an infected area on or under your skin that contains a collection of pus and other material. An abscess may also be called a furuncle,carbuncle, or boil. An abscess can occur in or on almost any part of your body. Some abscesses break open (rupture) on their own. Most continue to get worse unless they are treated. The infection can spread deeper into the body and eventually into your blood, whichcan make you feel ill. Treatment usually involves draining the abscess. What are the causes? An abscess occurs when germs, like bacteria, pass through your skin and cause an infection. This may be caused by: A scrape or cut on your skin. A puncture wound through your skin, including a needle injection or insect bite. Blocked oil or sweat glands. Blocked and infected hair follicles. A cyst that forms beneath your skin (sebaceous cyst) and becomes infected. What increases the risk? This condition is more likely to develop in people who: Have a weak body defense system (immune system). Have diabetes. Have dry and irritated skin. Get frequent injections or use illegal IV drugs. Have a foreign body in a wound, such as a splinter. Have problems with their lymph system or veins. What are the signs or symptoms? Symptoms of this condition include: A painful, firm bump under the skin. A bump with pus at the top. This may break through the skin and drain. Other symptoms include: Redness surrounding the abscess site. Warmth. Swelling of the lymph nodes (glands) near the abscess. Tenderness. A sore on the skin. How is this diagnosed? This condition may be diagnosed based on: A physical exam. Your medical history. A sample of pus. This may be used to find out what is causing the infection. Blood tests. Imaging tests, such as an ultrasound, CT scan, or MRI. How is this treated? A small abscess that drains on its own may not need treatment. Treatment for larger abscesses  may include: Moist heat or heat pack applied to the area several times a day. A procedure to drain the abscess (incision and drainage). Antibiotic medicines. For a severe abscess, you may first get antibiotics through an IV and then change to antibiotics by mouth. Follow these instructions at home: Medicines  Take over-the-counter and prescription medicines only as told by your health care provider. If you were prescribed an antibiotic medicine, take it as told by your health care provider. Do not stop taking the antibiotic even if you start to feel better.  Abscess care  If you have an abscess that has not drained, apply heat to the affected area. Use the heat source that your health care provider recommends, such as a moist heat pack or a heating pad. Place a towel between your skin and the heat source. Leave the heat on for 20-30 minutes. Remove the heat if your skin turns bright red. This is especially important if you are unable to feel pain, heat, or cold. You may have a greater risk of getting burned. Follow instructions from your health care provider about how to take care of your abscess. Make sure you: Cover the abscess with a bandage (dressing). Change your dressing or gauze as told by your health care provider. Wash your hands with soap and water before you change the dressing or gauze. If soap and water are not available, use hand sanitizer. Check your abscess every day for signs of a worsening infection. Check for: More redness, swelling, or pain. More fluid or blood. Warmth. More   pus or a bad smell.  General instructions To avoid spreading the infection: Do not share personal care items, towels, or hot tubs with others. Avoid making skin contact with other people. Keep all follow-up visits as told by your health care provider. This is important. Contact a health care provider if you have: More redness, swelling, or pain around your abscess. More fluid or blood coming  from your abscess. Warm skin around your abscess. More pus or a bad smell coming from your abscess. A fever. Muscle aches. Chills or a general ill feeling. Get help right away if you: Have severe pain. See red streaks on your skin spreading away from the abscess. Summary A skin abscess is an infected area on or under your skin that contains a collection of pus and other material. A small abscess that drains on its own may not need treatment. Treatment for larger abscesses may include having a procedure to drain the abscess and taking an antibiotic. This information is not intended to replace advice given to you by your health care provider. Make sure you discuss any questions you have with your healthcare provider. Document Revised: 07/15/2018 Document Reviewed: 05/07/2017 Elsevier Patient Education  2022 [employer] Inc.  

## 2020-10-16 NOTE — Progress Notes (Signed)
  Subjective:     Patient ID: [facility], male   DOB: 03/27/1991, 30 y.o.   MRN: [id number]  HPI Pt with abscess to the posterior neck for several days Mother has been treating with topical type OTC meds Denies drainage from the area Hx of similar in the past Pt with sig PMH  Review of Systems  Constitutional: Negative.   Skin:  Positive for color change, rash and wound.      Objective:   Physical Exam Vitals and nursing note reviewed.  Constitutional:      General: He is not in acute distress.    Appearance: He is not toxic-appearing.  Pt in wheelchair + Fluct 2cm lesion to the posterior cervical region at the base Minimal surrounding erythema/edema/induration No active drainage Does not appear to have limited ROM of C-spine No post nodes palp Offered I&D with ATB vs warm compresses and ATB     Assessment:     1. Abscess        Plan:     Warm compresses per pt wishes Nl course reviewed Discussed wound care is the area spont ruptures Keflex susp to pharmacy If sx worsen f/u for I &D Discussed case with pt primary caregiver

## 2020-10-16 NOTE — Telephone Encounter (Signed)
[facility] has a 3:05, see if she can either do a video visit or in person visit.  May need to be I&D'd vs abx but he would need to see

## 2020-10-18 ENCOUNTER — Telehealth: Payer: Self-pay | Admitting: *Deleted

## 2020-10-18 MED ORDER — CEPHALEXIN 250 MG/5ML PO SUSR: 500.0000 mg | Freq: Three times a day (TID) | ORAL | 0 refills | Status: AC

## 2020-10-18 NOTE — Addendum Note (Signed)
Addended by: Julious Payer D on: 10/18/2020 03:44 PM   Modules accepted: Orders

## 2020-10-18 NOTE — Telephone Encounter (Signed)
Rx sent for 20 mls

## 2020-10-18 NOTE — Telephone Encounter (Signed)
TC from [facility] pharmacy RE: Keflex 250 mg/5ml  take 10 mls TID for 10 days w/ no refills, not enough for 10 days Can this be sent in for 200 mls for the rest of the amount

## 2020-10-31 ENCOUNTER — Other Ambulatory Visit: Payer: Self-pay

## 2020-10-31 ENCOUNTER — Encounter (INDEPENDENT_AMBULATORY_CARE_PROVIDER_SITE_OTHER): Payer: Self-pay | Admitting: Pediatrics

## 2020-10-31 ENCOUNTER — Ambulatory Visit (INDEPENDENT_AMBULATORY_CARE_PROVIDER_SITE_OTHER): Payer: Medicaid Other | Admitting: Pediatrics

## 2020-10-31 DIAGNOSIS — G8110 Spastic hemiplegia affecting unspecified side: Secondary | ICD-10-CM

## 2020-10-31 DIAGNOSIS — S069X9S Unspecified intracranial injury with loss of consciousness of unspecified duration, sequela: Secondary | ICD-10-CM

## 2020-10-31 NOTE — Patient Instructions (Signed)
Next appointment for baclofen bump refill in September 6th, 2022 at 8:30 am.   Thanks you for coming today.

## 2020-10-31 NOTE — Progress Notes (Signed)
   Patient: SH[facility] MRN: [id number] Sex: male DOB: Jul 18, 1990  Provider: Ellison Carwin, MD Location of Care: [facility]  Procedure was carried out by Dr. Lezlie Lye.  I supervised.  Deetta Perla MD

## 2020-11-01 DIAGNOSIS — S06360D Traumatic hemorrhage of cerebrum, unspecified, without loss of consciousness, subsequent encounter: Secondary | ICD-10-CM | POA: Diagnosis not present

## 2020-11-01 DIAGNOSIS — G911 Obstructive hydrocephalus: Secondary | ICD-10-CM | POA: Diagnosis not present

## 2020-11-01 DIAGNOSIS — S06360A Traumatic hemorrhage of cerebrum, unspecified, without loss of consciousness, initial encounter: Secondary | ICD-10-CM | POA: Diagnosis not present

## 2020-11-01 DIAGNOSIS — T85733A Infection and inflammatory reaction due to implanted electronic neurostimulator of spinal cord, electrode (lead), initial encounter: Secondary | ICD-10-CM | POA: Diagnosis not present

## 2020-11-16 ENCOUNTER — Ambulatory Visit: Payer: Medicaid Other | Admitting: Family Medicine

## 2020-11-20 DIAGNOSIS — T85733A Infection and inflammatory reaction due to implanted electronic neurostimulator of spinal cord, electrode (lead), initial encounter: Secondary | ICD-10-CM | POA: Diagnosis not present

## 2020-11-20 DIAGNOSIS — S06360D Traumatic hemorrhage of cerebrum, unspecified, without loss of consciousness, subsequent encounter: Secondary | ICD-10-CM | POA: Diagnosis not present

## 2020-11-20 DIAGNOSIS — S06360A Traumatic hemorrhage of cerebrum, unspecified, without loss of consciousness, initial encounter: Secondary | ICD-10-CM | POA: Diagnosis not present

## 2020-11-20 DIAGNOSIS — G911 Obstructive hydrocephalus: Secondary | ICD-10-CM | POA: Diagnosis not present

## 2020-12-06 DIAGNOSIS — S06890A Other specified intracranial injury without loss of consciousness, initial encounter: Secondary | ICD-10-CM | POA: Diagnosis not present

## 2020-12-11 ENCOUNTER — Other Ambulatory Visit: Payer: Self-pay

## 2020-12-11 ENCOUNTER — Ambulatory Visit (INDEPENDENT_AMBULATORY_CARE_PROVIDER_SITE_OTHER): Payer: Medicaid Other | Admitting: Pediatrics

## 2020-12-11 DIAGNOSIS — G8110 Spastic hemiplegia affecting unspecified side: Secondary | ICD-10-CM

## 2020-12-11 NOTE — Patient Instructions (Signed)
Next appointment in October 17th, 2022
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
## 2020-12-12 ENCOUNTER — Ambulatory Visit (HOSPITAL_COMMUNITY): Payer: Medicaid Other

## 2020-12-14 DIAGNOSIS — G8110 Spastic hemiplegia affecting unspecified side: Secondary | ICD-10-CM | POA: Diagnosis not present

## 2020-12-17 DIAGNOSIS — S06360A Traumatic hemorrhage of cerebrum, unspecified, without loss of consciousness, initial encounter: Secondary | ICD-10-CM | POA: Diagnosis not present

## 2020-12-17 DIAGNOSIS — G911 Obstructive hydrocephalus: Secondary | ICD-10-CM | POA: Diagnosis not present

## 2020-12-17 DIAGNOSIS — T85733A Infection and inflammatory reaction due to implanted electronic neurostimulator of spinal cord, electrode (lead), initial encounter: Secondary | ICD-10-CM | POA: Diagnosis not present

## 2020-12-17 DIAGNOSIS — S06360D Traumatic hemorrhage of cerebrum, unspecified, without loss of consciousness, subsequent encounter: Secondary | ICD-10-CM | POA: Diagnosis not present

## 2020-12-18 ENCOUNTER — Other Ambulatory Visit: Payer: Self-pay

## 2020-12-18 ENCOUNTER — Encounter (HOSPITAL_COMMUNITY): Payer: Self-pay | Admitting: Occupational Therapy

## 2020-12-18 ENCOUNTER — Ambulatory Visit (HOSPITAL_COMMUNITY): Payer: Medicaid Other | Attending: Family Medicine | Admitting: Occupational Therapy

## 2020-12-18 DIAGNOSIS — S069X6D Unspecified intracranial injury with loss of consciousness greater than 24 hours without return to pre-existing conscious level with patient surviving, subsequent encounter: Secondary | ICD-10-CM | POA: Insufficient documentation

## 2020-12-18 DIAGNOSIS — M6281 Muscle weakness (generalized): Secondary | ICD-10-CM | POA: Insufficient documentation

## 2020-12-18 NOTE — Therapy (Signed)
[place], [place], [address] Phone: [phone]   Fax:  [phone]  Occupational Therapy Wheelchair Evaluation  Patient Details  Name: Scott Bean MRN: [id number] Date of Birth: [birth year] Referring Provider (OT): Dr. 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      [facility] OT Assessment - 12/18/20 1258       Assessment   Medical Diagnosis spastic quadriplegia s/p TBI    Referring Provider (OT) Dr. Adam Phenix    Onset Date/Surgical Date 09/08/04      Precautions   Precautions Fall      Balance Screen   Has the patient fallen in the past 6 months No              Mobility/Seating Evaluation    PATIENT INFORMATION: Name: Scott Bean DOB: 04/25/1990  Sex: M Date seen: 12/18/2020 Time: 1300  Address:  [address]                  [place], [place] [address] Physician: Dr. 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   Patient/Caregiver Goals: To maintain ability to maneuver pt around the home and community.   Patient was seen for face-to-face evaluation for new manual wheelchair.  Also present was Deberah Pelton to discuss recommendations and wheelchair options.  Further paperwork was completed and sent to vendor.  Patient appears to qualify for manual mobility device at this time per objective findings.   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 Onset: 09/08/2004 Diagnosis: generalized convulsive epilepsy   [x]Progressive Disease Relevant past and future surgeries: decompressive craniotomy; PEG TUBE PLACEMENT; VENTRICULOPERITONEAL SHUNT   Height: 6'7" Weight: 194 Explain recent changes or trends in weight:    History including Falls: None Reported    HOME ENVIRONMENT: [x]House  []Condo/town home  []Apartment  []Assisted Living    []Lives Alone [x] Lives with Others  Hours with caregiver: 15  []Home is accessible to patient           Stairs      []Yes [] No     Ramp [x]Yes []No Comments:  Wooden ramp with aluminum threshold; hardwood flooring, carpet in living room, linoleum in kitchen   COMMUNITY ADL: TRANSPORTATION: []Car    [x]Van    []Public Transportation    []Adapted w/c Lift    []Ambulance    []Other:       [x]Sits in wheelchair during transport  Employment/School: N/A Specific requirements pertaining to mobility   Other: Lucianne Lei  has ramp at the back that pulls out to allow pt to be rolled into [place] for transportation.    FUNCTIONAL/SENSORY PROCESSING SKILLS:  Handedness:   []Right     []Left    [x]NA  Comments:  Pt with spastic quadriplegia and has no functional use of either hand  Functional Processing Skills for Wheeled Mobility []Processing Skills are adequate for safe wheelchair operation  Areas of concern than may interfere with safe operation of wheelchair Description of problem   [] Attention to environment      []Judgment      [] Hearing  [] Vision or visual processing      []Motor Planning  [] Fluctuations in Behavior  Caregiver completes all mobility tasks due to physical and cognitive impairments.     VERBAL COMMUNICATION: []WFL receptive [] WFL expressive []Understandable  []Difficult to understand  [x]non-communicative [] Uses an augmented communication device  CURRENT SEATING / MOBILITY: Current Mobility Base:  []None []Dependent [x]Manual []Scooter []Power  Type of Control:   Manufacturer:  QuickieSize:  Age: 30 years old (pt received in 2006) (pt received in 2006)  Current Condition of Mobility Base:  Good   Current Wheelchair components:  Manual mobility base, tilt, back, seat cushion, pelvic thigh support, lateral trunk supports, mounting hardware, arm rests, leg rests, foot supports, tires, headrest, seatbelt  Describe posture in present seating system:  Pt positioned in tilt, feet supported on foot plates, lateral trunk supports in use, note left windswept hips. Shoulders retracted, elbow positioned at 90 degrees with hands fisted due to spasticity.       SENSATION and SKIN ISSUES: Sensation [x]Intact  []Impaired []Absent  Level of sensation: unable to assess  Pressure Relief: Able to perform effective pressure relief :    []Yes  [x] No Method:  If not, Why?: Pt is dependent in all mobility.  Skin Issues/Skin Integrity Current Skin Issues  []Yes [x]No []Intact [] Red area[] Open Area  []Scar Tissue []At risk  from prolonged sitting Where    History of Skin Issues  [x]Yes []No Where  sacral When  Intermittent since inury onset in 2006  Hx of skin flap surgeries  []Yes [x]No Where   When    Limited sitting tolerance [x]Yes []No Hours spent sitting in wheelchair daily: 6   Complaint of Pain:  Please describe: Pt is non-verbal, Mother reports looking at facial expressions to determine pain   Swelling/Edema: BLE ankles and feet   ADL STATUS (in reference to wheelchair use):  Indep Assist Unable Indep with Equip Not assessed Comments  Dressing [] [] [x] [] [] Pt is dependent in all ADLs since TBI in 2006  Eating [] [] [x] [] [] Pt is PEG tube dependent since TBI in 2006  Toileting [] [] [x] [] [] Pt is dependent in all ADLs since TBI in 2006  Bathing [] [] [x] [] [] Pt is  dependent in all ADLs since TBI in 2006  Grooming/Hygiene [] [] [x] [] [] Pt is dependent in all ADLs since TBI in 2006  Meal Prep [] [] [x] [] [] Pt is dependent in all ADLs since TBI in 2006  IADLS [] [] [x] [] [] Pt is dependent in all ADLs since TBI in 2006  Bowel Management: []Continent  [x]Incontinent  []Accidents Comments:  Wears depends since TBI in 2006  Bladder Management: []Continent  [x]Incontinent  []Accidents Comments:  Wears depends since TBI in 2006     WHEELCHAIR SKILLS: Manual w/c Propulsion: []UE or LE strength and endurance sufficient to participate in ADLs using manual wheelchair Arm : []left []right   []Both      Distance:  Foot:  []left []right   []Both  Operate Scooter: [] Strength, hand grip, balance and transfer appropriate for use []Living environment is accessible for use of scooter  Operate Power w/c:  [] Std. Joystick   [] Alternative Controls Indep [] Assist [] Dependent/unable [] N/A []  []Safe          [] Functional      Distance:   Bed confined without wheelchair [x] Yes [] No   STRENGTH/RANGE OF MOTION:  Bilateral Range of Motion Strength  Shoulder Limited P/ROM due to spasticity  and non-functional 0/5; Assessment limited due to inability to follow commands  Elbow Limited P/ROM due to spasticity and non-functional, pt does demonstrate mild left elbow flexion.  Left 2/5; Right  0/5; Assessment limited due to inability to follow commands  Wrist/Hand Limited P/ROM due to spasticity and non-functional  0/5; Assessment limited due to inability to follow commands  Hip Limited P/ROM due to spasticity and non-functional 0/5; Assessment limited due to inability to follow commands  Knee Limited P/ROM due to spasticity and non-functional 0/5; Assessment limited due to inability to follow commands  Ankle Limited P/ROM due to spasticity and non-functional  0/5; Assessment limited due to inability to follow commands     MOBILITY/BALANCE:  [x] Patient is totally dependent for mobility  Pt with spastic quadriplegia since TBI in 2006, dependent for mobility and is unable to participate in functional transfers. Parents have a lift available if needed but are currently able to perform dependent transfers.     Balance Transfers Ambulation  Sitting Balance: Standing Balance: [] Independent [] Independent/Modified Independent  [] WFL     [] [facility] [] Supervision [] Supervision  [] Uses UE for balance  [] Supervision [] Min Assist [] Ambulates with Assist      [] Min Assist [] Min assist [] Mod Assist [] Ambulates with Device:      [] RW  [] StW  [] Kasandra Knudsen  []   [] Mod Assist [] Mod assist [] Max assist   [] Max Assist [] Max assist [x] Dependent [] Indep. Short Distance Only  [x] Unable [x] Unable [] Lift / Sling Required Distance (in feet)     [] Sliding board [x] Unable to Ambulate (see explanation below)  Cardio Status:  []Intact  [] Impaired   [x] NA       Respiratory Status:  []Intact   []Impaired   [x]NA       Orthotics/Prosthetics: N/A  Comments (Address manual vs power w/c vs scooter): Pt is dependent in mobility since TBI in 2006 and is unable to operate a power wheelchair due to  functional deficits.          Anterior / Posterior  Obliquity Rotation-Pelvis   PELVIS    [x] [] []  Neutral Posterior Anterior  [] [] []  WFL Rt elev Lt elev  [] [] []  WFL Right Left                      Anterior    Anterior     [] Fixed [] Other [x] Partly Flexible [] Flexible   [] Fixed [] Other [] Partly Flexible  [] Flexible  [] Fixed [] Other [] Partly Flexible  [] Flexible   TRUNK  [x] [] []  [facility]  Thoracic  Lumbar  Kyphosis Lordosis  [] [] [x]  WFL Convex Convex  Right Left []c-curve []s-curve []multiple  [x] Neutral [] Left-anterior [] Right-anterior     [] Fixed [] Flexible [x] Partly Flexible [] Other  [] Fixed [] Flexible [x] Partly Flexible [] Other  [] Fixed             [] Flexible [x] Partly Flexible [] Other    Position Windswept    HIPS          [x]           []              []   Neutral       Abduct        ADduct         []          []           [x]  Neutral Right           Left      [] Fixed [] Subluxed [x] Partly Flexible [] Dislocated [] Flexible  [] Fixed [] Other [x] Partly Flexible  [] Flexible                 Foot Positioning Knee Positioning  Ankles in neutral, limited ROM available due to spasticity    [] WFL  [x]Lt [x]Rt [x] [facility]  [x]Lt [x]Rt    KNEES ROM concerns: ROM concerns:    & Dorsi-Flexed []Lt []Rt ROM limited due to spasticity    FEET Plantar Flexed []Lt []Rt      Inversion                 [x]Lt [x]Rt      Eversion                 []Lt []Rt     HEAD [x] Functional [] Good Head Control  Pt is able to lift head for Mom during bathing and dressing, turns head side to side while seated in tilt in wheelchair.   & [] Flexed         [] Extended [] Adequate Head Control    NECK [] Rotated  Lt  [] Lat Flexed Lt [] Rotated  Rt [] Lat Flexed Rt [x] Limited Head Control     [] Cervical Hyperextension [] Absent  Head Control     SHOULDERS ELBOWS WRIST& HAND       Left     Right    Left     Right    Left     Right   U/E  []Functional           []Functional Position at 90 degress flexion at rest, note one instance of slight flexion trying to reach for mask Positioned at 90 degrees flexion at rest, no  volitional movement [x]Fisting             [x]Fisting      []elev   []dep      []elev   []dep       []pro -[x]retract     []pro  [x]retract []subluxed             []subluxed           Goals for Wheelchair Mobility  [] Independence with mobility in the home with motor related ADLs (MRADLs)  [] Independence with MRADLs in the community [x] Provide dependent mobility  [x] Provide recline     [x]Provide tilt   Goals for Seating system [x] Optimize pressure distribution [x] Provide support needed to facilitate function or safety [x] Provide corrective forces to assist with maintaining or improving posture [x] Accommodate client's posture:   current seated postures and positions are not flexible or will not tolerate corrective forces [] Client to be independent with relieving pressure in the wheelchair []Enhance physiological function such as breathing, swallowing, digestion  Simulation ideas/Equipment trials: State why other equipment was unsuccessful: Pt is a 30 y/o male s/p TBI with spastic quadriplegia in 2006. Pt has had his original Quickie manual wheelchair since his injury in 2006. Pt has no functional movement or use of his BUE, BLE, or trunk and is dependent on caregivers for all mobility and ADL completion. Pt's current wheelchair has been maintained and is now beginning to have malfunctions that require increased maintenance and repairs. Caregivers would like to replace this chair with the same model and features, with any accomodations for changes in size and posture since 2006. The pt is unable to operate a power wheelchair and is unable propel a manual wheelchair independently. He is unable to use any other type of equipment such as canes or walkers.     MOBILITY BASE RECOMMENDATIONS and  JUSTIFICATION: MOBILITY COMPONENT JUSTIFICATION  Manufacturer: QuickieModel: SR45   Size: Width 18"Seat Depth 22" [x]provide transport from point A to B      [x]promote Indep mobility  [x]is not a safe, functional ambulator [x]walker or cane inadequate [x]non-standard width/depth necessary to accommodate anatomical measurement []   [x]Manual Mobility Base [x]non-functional ambulator    []Scooter/POV  []can safely operate  []can safely transfer   []has adequate trunk stability  []cannot functionally propel manual w/c  []Power Mobility Base  []non-ambulatory  []cannot functionally propel manual wheelchair  [] cannot functionally and safely operate scooter/POV []can safely operate and willing to  []Stroller Base []infant/child  []unable to propel manual wheelchair []allows for growth []non-functional ambulator []non-functional UE []Indep mobility is not a goal at this time  [x]Tilt  []Forward []Backward []Powered tilt  [x]Manual tilt  [x]change position against gravitational force on head and shoulders  [x]change position for pressure relief/cannot weight shift [x]transfers  [x]management of tone [x]rest periods [x]control edema [x]facilitate postural control  []   []Recline  []Power recline on power base []Manual recline on manual base  []accommodate femur to back angle  []bring to full recline for ADL care  []change position for pressure relief/cannot weight shift []rest periods []repositioning for transfers or clothing/diaper /catheter changes []head positioning  []Lighter weight required []self- propulsion  []lifting []   []Heavy Duty required []user weight greater than 250# []extreme tone/ over active movement []broken frame on previous chair []   [x] Back  [x] Angle Adjustable [] Custom molded T-back [x]postural control [x]control of tone/spasticity [x]accommodation of range of motion [x]UE functional  control [x]accommodation for seating system [x] Standard back  will not accommodate pt's trunk length and height [x]provide lateral trunk support [x]accommodate deformity [x]provide posterior trunk support [x]provide lumbar/sacral support [x]support trunk in midline [x]Pressure relief over spinal processes  [x] Seat Cushion ROHO [x]impaired sensation  []decubitus ulcers present [x]history of pressure ulceration []prevent pelvic extension []low maintenance  [x]stabilize pelvis  []accommodate obliquity [x]accommodate multiple deformity [x]neutralize lower extremity position [x]increase pressure distribution []   [x] Pelvic/thigh support  [x] Lateral thigh guide [] Distal medial pad  [] Distal lateral pad [] pelvis in neutral [x]accommodate pelvis [] position upper legs [x] alignment [] accommodate ROM [] decr adduction [x]accommodate tone [x]removable for transfers [x]decr abduction  [x] Lateral trunk Supports [x] Lt     [x] Rt [x]decrease lateral trunk leaning [x]control tone [x]contour for increased contact [x]safety  [x]accommodate asymmetry []   [x] Mounting hardware  []lateral trunk supports  []back   []seat []headrest      [x] thigh support []fixed   []swing away []attach seat platform/cushion to w/c frame []attach back cushion to w/c frame [x]mount postural supports []mount headrest  []swing medial thigh support away [x]swing lateral supports away for transfers  []     Armrests  []fixed [x]adjustable height []removable   []swing away  [x]flip back   []reclining []full length pads []desk    []pads tubular  [x]provide support with elbow at 90   []provide support for w/c tray []change of height/angles for variable activities [x]remove for transfers [x]allow to come closer to table top []remove for access to tables []   Hangers/ Leg rests  []60 []70 []90 [x]elevating []heavy duty  [x]articulating []fixed []lift off []swing away     []power [x]provide LE support  [x]accommodate to hamstring tightness [x]elevate legs during  recline   [x]provide change in position for Legs [x]Maintain placement of feet on footplate [x]durability [x]enable transfers [x]decrease edema [x]Accommodate lower leg length []   Foot support Footplate    [x]Lt  [x] Rt  [] Center mount [x]flip up     [x]depth/angle adjustable []Amputee adapter    [] Lt     [] Rt [x]provide foot support [x]accommodate to ankle ROM [x]transfers []Provide support for residual extremity [] allow foot to go under wheelchair base [x] decrease tone  []   [] Ankle strap/heel loops []support foot on foot support []decrease extraneous movement []provide input to heel  []protect foot  Tires: []pneumatic  [x]flat free inserts  []solid  [x]decrease maintenance  [x]prevent frequent flats [x]increase shock absorbency [x]decrease pain from road shock [x]decrease spasms from road shock []   [x] Headrest  [x]provide posterior head support [x]provide posterior neck support []provide lateral head support []provide anterior head support [x]support during tilt and recline []improve feeding   [x]improve respiration []placement of switches [x]safety  [x]accommodate ROM  [x]accommodate tone [x]improve visual orientation  [] Anterior chest strap [] Vest [] Shoulder retractors  []decrease forward movement of shoulder []accommodation of TLSO []decrease forward movement of trunk []decrease shoulder elevation []added abdominal support []alignment []assistance with shoulder control  []   Pelvic Positioner [x]Belt []SubASIS bar []Dual Pull []stabilize tone [x]decrease falling out of chair/ **will not Decr potential for sliding due to pelvic tilting []prevent excessive rotation []pad for protection over boney prominence []prominence comfort []special pull angle to control rotation []   Upper Extremity Support []L   [] R []Arm trough    []hand support [] tray       []full tray []  swivel mount []decrease edema      []decrease subluxation   []control  tone   []placement for AAC/Computer/EADL []decrease gravitational pull on shoulders []provide midline positioning []provide support to increase UE function []provide hand support in natural position []provide work surface   POWER WHEELCHAIR CONTROLS  []Proportional  []Non-Proportional Type  []Left  []Right []provides access for controlling wheelchair   []lacks motor control to operate proportional drive control []unable to understand proportional controls  Actuator Control Module  []Single  []Multiple   []Allow the client to operate the power seat function(s) through the joystick control   []Safety Reset Switches []Used to change modes and stop the wheelchair when driving in latch mode    []Upgraded Electronics   []programming for accurate control []progressive Disease/changing condition []non-proportional drive control needed []Needed in order to operate power seat functions through joystick control   []Display box []Allows user to see in which mode and drive the wheelchair is set  []necessary for alternate controls    []Digital interface electronics []Allows w/c to operate when using alternative drive controls  []ASL Head Array []Allows client to operate wheelchair  through switches placed in tri-panel headrest  []Sip and puff with tubing kit []needed to operate sip and puff drive controls  []Upgraded tracking electronics []increase safety when driving []correct tracking when on uneven surfaces  []Mount for switches or joystick []Attaches switches to w/c  []Swing away for access or transfers []midline for optimal placement []provides for consistent access  []Attendant controlled joystick plus mount []safety []long distance driving []operation of seat functions []compliance with transportation regulations []     Rear wheel placement/Axle adjustability []None [x]semi adjustable []fully adjustable  []improved UE access to wheels [x]improved stability [x]changing angle in space  for improvement of postural stability []1-arm drive access []amputee pad placement []   Wheel rims/ hand rims  []metal  []plastic coated []oblique projections []vertical projections []Provide ability to propel manual wheelchair  [] Increase self-propulsion with hand weakness/decreased grasp  Push handles []extended  [x]angle adjustable  []standard [x]caregiver access [x]caregiver assist []allows "hooking" to enable increased ability to perform ADLs or maintain balance  One armed device  []Lt   []Rt []enable propulsion of manual wheelchair with one arm   []     Brake/wheel lock extension [x] Lt   [x] Rt [x]increase indep in applying wheel locks   []Side guards []prevent clothing getting caught in wheel or becoming soiled [] prevent skin tears/abrasions  Battery:  []to power wheelchair   Other: Optometrist Anti-tippers             Knee adductors  For transit For safety and mobility For enhance thigh support and positioning   The above equipment has a life- long use expectancy. Growth and changes in medical and/or functional conditions would be the exceptions. This is to certify that the therapist has no financial relationship with durable medical provider or manufacturer. The therapist will not receive remuneration of any kind for the equipment recommended in this evaluation.   Patient has mobility limitation that significantly impairs safe, timely participation in one or more mobility related ADL's.  (bathing, toileting, feeding, dressing, grooming, moving from room to room)                                                             [  x] Yes [] No Will mobility device sufficiently improve ability to participate and/or be aided in participation of MRADL's?         [x] Yes [] No Can limitation be compensated for with use of a cane or walker?                                                                                [] Yes [x] No Does patient or caregiver demonstrate  ability/potential ability & willingness to safely use the mobility device?   [x] Yes [] No Does patient's home environment support use of recommended mobility device?                                                    [x] Yes [] No Does patient have sufficient upper extremity function necessary to functionally propel a manual wheelchair?    [] Yes [x] No Does patient have sufficient strength and trunk stability to safely operate a POV (scooter)?                                  [] Yes [x] No Does patient need additional features/benefits provided by a power wheelchair for MRADL's in the home?       [] Yes [x] No Does the patient demonstrate the ability to safely use a power wheelchair?                                                              [] Yes [x] No  Therapist Name Printed: Guadelupe Sabin, OTR/L Date: 12/18/2020  Therapist's Signature:   Date:   Supplier's Name Printed: [employer] Pelton Date: 12/18/2020  Supplier's Signature:   Date:  Patient/Caregiver Signature:   Date:     This is to certify that I have read this evaluation and do agree with the content within:   65 Name Printed:   53 Signature:  Date:     This is to certify that I, the above signed therapist have the following affiliations: [] This DME provider [] Manufacturer of recommended equipment [] Patient's long term care facility [x] None of the above                              Patient will benefit from skilled therapeutic intervention in order to improve the following deficits and impairments:           Visit Diagnosis: Traumatic brain injury, with loss of consciousness greater than 24 hours without return to pre-existing conscious level with patient surviving, subsequent encounter  Muscle weakness (generalized)    Problem List Patient Active Problem List   Diagnosis Date Noted   Irritation around percutaneous endoscopic gastrostomy (PEG) tube site (  [place])  08/30/2020   Upper respiratory infection with cough and congestion 08/13/2020   Transient alteration of awareness 03/18/2019   Spastic hemiplegia affecting nondominant side ([facility]) 08/10/2012   Spastic hemiplegia affecting dominant side ([place]) 08/10/2012   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 [place], [place], [address] Phone: [phone]   Fax:  [phone]  Name: Scott Bean MRN: [id number] Date of Birth: [birth year]

## 2020-12-31 ENCOUNTER — Telehealth (INDEPENDENT_AMBULATORY_CARE_PROVIDER_SITE_OTHER): Payer: Self-pay | Admitting: Pediatrics

## 2020-12-31 ENCOUNTER — Other Ambulatory Visit (INDEPENDENT_AMBULATORY_CARE_PROVIDER_SITE_OTHER): Payer: Self-pay | Admitting: Pediatrics

## 2020-12-31 ENCOUNTER — Other Ambulatory Visit: Payer: Self-pay | Admitting: Family Medicine

## 2020-12-31 DIAGNOSIS — G40309 Generalized idiopathic epilepsy and epileptic syndromes, not intractable, without status epilepticus: Secondary | ICD-10-CM

## 2020-12-31 NOTE — Telephone Encounter (Signed)
I called and spoke to Mom. I explained that the Rx has not been denied but that it had to be faxed to the pharmacy. I instructed Mom to call the pharmacy again. TG

## 2020-12-31 NOTE — Telephone Encounter (Signed)
  Who's calling (name and relationship to patient) :[place] (Mother)  Best contact number: [phone] (Home) Provider they see: Lezlie Lye, MD  Reason for call: Pharmacy is stating that they declined the medication because we sent rx to different pharmacy then what the patient normally uses. Please contact mom when completed.     PRESCRIPTION REFILL ONLY  Name of prescription:  Pharmacy:

## 2021-01-01 DIAGNOSIS — S06360A Traumatic hemorrhage of cerebrum, unspecified, without loss of consciousness, initial encounter: Secondary | ICD-10-CM | POA: Diagnosis not present

## 2021-01-01 DIAGNOSIS — G911 Obstructive hydrocephalus: Secondary | ICD-10-CM | POA: Diagnosis not present

## 2021-01-01 DIAGNOSIS — T85733A Infection and inflammatory reaction due to implanted electronic neurostimulator of spinal cord, electrode (lead), initial encounter: Secondary | ICD-10-CM | POA: Diagnosis not present

## 2021-01-01 DIAGNOSIS — S06360D Traumatic hemorrhage of cerebrum, unspecified, without loss of consciousness, subsequent encounter: Secondary | ICD-10-CM | POA: Diagnosis not present

## 2021-01-07 DIAGNOSIS — G911 Obstructive hydrocephalus: Secondary | ICD-10-CM | POA: Diagnosis not present

## 2021-01-07 DIAGNOSIS — T85733A Infection and inflammatory reaction due to implanted electronic neurostimulator of spinal cord, electrode (lead), initial encounter: Secondary | ICD-10-CM | POA: Diagnosis not present

## 2021-01-07 DIAGNOSIS — S06360D Traumatic hemorrhage of cerebrum, unspecified, without loss of consciousness, subsequent encounter: Secondary | ICD-10-CM | POA: Diagnosis not present

## 2021-01-07 DIAGNOSIS — S06360A Traumatic hemorrhage of cerebrum, unspecified, without loss of consciousness, initial encounter: Secondary | ICD-10-CM | POA: Diagnosis not present

## 2021-01-13 DIAGNOSIS — G8110 Spastic hemiplegia affecting unspecified side: Secondary | ICD-10-CM | POA: Diagnosis not present

## 2021-01-16 ENCOUNTER — Telehealth (INDEPENDENT_AMBULATORY_CARE_PROVIDER_SITE_OTHER): Payer: Medicaid Other | Admitting: Family Medicine

## 2021-01-16 NOTE — Progress Notes (Signed)
rescheduled

## 2021-01-21 ENCOUNTER — Other Ambulatory Visit: Payer: Self-pay

## 2021-01-21 ENCOUNTER — Telehealth (INDEPENDENT_AMBULATORY_CARE_PROVIDER_SITE_OTHER): Payer: Self-pay | Admitting: Pediatrics

## 2021-01-21 ENCOUNTER — Other Ambulatory Visit: Payer: Medicaid Other

## 2021-01-21 ENCOUNTER — Ambulatory Visit (INDEPENDENT_AMBULATORY_CARE_PROVIDER_SITE_OTHER): Payer: Medicaid Other | Admitting: Pediatrics

## 2021-01-21 DIAGNOSIS — S069X9S Unspecified intracranial injury with loss of consciousness of unspecified duration, sequela: Secondary | ICD-10-CM

## 2021-01-21 DIAGNOSIS — G8110 Spastic hemiplegia affecting unspecified side: Secondary | ICD-10-CM

## 2021-01-21 NOTE — Telephone Encounter (Signed)
Lab orders have been faxed to lab.
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
## 2021-01-21 NOTE — Patient Instructions (Signed)
Return for baclofen pump procedure March 04, 2021 at 930.

## 2021-01-21 NOTE — Telephone Encounter (Signed)
  Who's calling (name and relationship to patient)  Anitra  Best contact number:[phone]   Provider they see: Dr. Gus Puma Reason for call: Parents is a [facility] for blood work and paperwork says [facility]. [facility] is needed correct lab order      PRESCRIPTION REFILL ONLY  Name of prescription:  Pharmacy:

## 2021-01-22 ENCOUNTER — Other Ambulatory Visit (INDEPENDENT_AMBULATORY_CARE_PROVIDER_SITE_OTHER): Payer: Self-pay

## 2021-01-22 DIAGNOSIS — G8110 Spastic hemiplegia affecting unspecified side: Secondary | ICD-10-CM

## 2021-01-22 NOTE — Telephone Encounter (Signed)
Please refax the lab orders, the orders MUST SAY [facility] NOT [facility]. In order to be completed.  Please make sure the orders say [facility]

## 2021-01-22 NOTE — Telephone Encounter (Signed)
Lab test orders have been redone with [facility] on it. And faxed to the lab.

## 2021-01-23 DIAGNOSIS — G911 Obstructive hydrocephalus: Secondary | ICD-10-CM | POA: Diagnosis not present

## 2021-01-23 DIAGNOSIS — S06360D Traumatic hemorrhage of cerebrum, unspecified, without loss of consciousness, subsequent encounter: Secondary | ICD-10-CM | POA: Diagnosis not present

## 2021-01-23 DIAGNOSIS — S06360A Traumatic hemorrhage of cerebrum, unspecified, without loss of consciousness, initial encounter: Secondary | ICD-10-CM | POA: Diagnosis not present

## 2021-01-23 DIAGNOSIS — T85733A Infection and inflammatory reaction due to implanted electronic neurostimulator of spinal cord, electrode (lead), initial encounter: Secondary | ICD-10-CM | POA: Diagnosis not present

## 2021-01-24 ENCOUNTER — Other Ambulatory Visit: Payer: Self-pay

## 2021-01-24 ENCOUNTER — Other Ambulatory Visit: Payer: Medicaid Other

## 2021-01-24 DIAGNOSIS — G8110 Spastic hemiplegia affecting unspecified side: Secondary | ICD-10-CM | POA: Diagnosis not present

## 2021-01-25 ENCOUNTER — Encounter (INDEPENDENT_AMBULATORY_CARE_PROVIDER_SITE_OTHER): Payer: Self-pay

## 2021-01-25 ENCOUNTER — Telehealth (INDEPENDENT_AMBULATORY_CARE_PROVIDER_SITE_OTHER): Payer: Self-pay | Admitting: Pediatrics

## 2021-01-25 LAB — COMPREHENSIVE METABOLIC PANEL
ALT: 33 IU/L (ref 0–44)
AST: 21 IU/L (ref 0–40)
Albumin/Globulin Ratio: 1 — ABNORMAL LOW (ref 1.2–2.2)
Albumin: 3.9 g/dL — ABNORMAL LOW (ref 4.1–5.2)
Alkaline Phosphatase: 178 IU/L — ABNORMAL HIGH (ref 44–121)
BUN/Creatinine Ratio: 23 — ABNORMAL HIGH (ref 9–20)
BUN: 11 mg/dL (ref 6–20)
Bilirubin Total: <0.2 mg/dL (ref 0.0–1.2)
CO2: 19 mmol/L — ABNORMAL LOW (ref 20–29)
Calcium: 8.8 mg/dL (ref 8.7–10.2)
Chloride: 102 mmol/L (ref 96–106)
Creatinine, Ser: 0.48 mg/dL — ABNORMAL LOW (ref 0.76–1.27)
Globulin, Total: 3.8 g/dL (ref 1.5–4.5)
Glucose: 106 mg/dL — ABNORMAL HIGH (ref 70–99)
Potassium: 5.2 mmol/L (ref 3.5–5.2)
Sodium: 140 mmol/L (ref 134–144)
Total Protein: 7.7 g/dL (ref 6.0–8.5)

## 2021-01-25 LAB — CBC WITH DIFFERENTIAL/PLATELET

## 2021-01-25 NOTE — Telephone Encounter (Signed)
I replied to the mother's MyChart message about his lab studies. TG

## 2021-01-25 NOTE — Telephone Encounter (Signed)
  Who's calling (name and relationship to patient) : Arletha Pili; mom  Best contact number: [phone]  Provider they see: Dr. Abdelmoumen/ Dr. Sharene Skeans  Reason for call: Mom called in wanting to know/discuss the results from the patients blood work.  Requested a call back.     PRESCRIPTION REFILL ONLY  Name of prescription:  Pharmacy:

## 2021-01-25 NOTE — Telephone Encounter (Signed)
Spoke with mom and let her know Dr. Mervyn Skeeters is out of the office and will not return until Wednesday. Also let mom know that the lab states they were unable to collect a sufficent amount of speciment to run the CBC so we are unable to provide results for that as the test was not preformed.   Mom states the results for the CMP were released to her on MyChart and wants to know if the abnormal results were anything that needed to be addressed before Dr. Mervyn Skeeters returns on Wednesday. Let mom know this message would be routed to the on call provider to see if anything needed to be addressed before Dr. Mervyn Skeeters returns. Mom states understanding and ended the call.

## 2021-01-28 ENCOUNTER — Telehealth: Payer: Medicaid Other | Admitting: Family Medicine

## 2021-01-28 ENCOUNTER — Encounter: Payer: Self-pay | Admitting: Family Medicine

## 2021-01-28 DIAGNOSIS — H00014 Hordeolum externum left upper eyelid: Secondary | ICD-10-CM | POA: Diagnosis not present

## 2021-01-28 DIAGNOSIS — E441 Mild protein-calorie malnutrition: Secondary | ICD-10-CM | POA: Diagnosis not present

## 2021-01-28 DIAGNOSIS — Z9189 Other specified personal risk factors, not elsewhere classified: Secondary | ICD-10-CM | POA: Diagnosis not present

## 2021-01-28 DIAGNOSIS — G8110 Spastic hemiplegia affecting unspecified side: Secondary | ICD-10-CM | POA: Diagnosis not present

## 2021-01-28 MED ORDER — ERYTHROMYCIN 5 MG/GM OP OINT: 1.0000 "application " | TOPICAL_OINTMENT | Freq: Every day | OPHTHALMIC | 0 refills | Status: AC

## 2021-01-28 NOTE — Progress Notes (Signed)
MyChart Video visit  Subjective: BJ:YNWG to face for wheelchair PCP: Raliegh Ip, DO [id number] Scott Bean is a 30 y.o. male. Patient provides verbal consent for consult held via video.  Due to COVID-19 pandemic this visit was conducted virtually. This visit type was conducted due to national recommendations for restrictions regarding the COVID-19 Pandemic (e.g. social distancing, sheltering in place) in an effort to limit this patient's exposure and mitigate transmission in our community. All issues noted in this document were discussed and addressed.  A physical exam was not performed with this format.   Location of patient: home Location of provider: [facility] Others present for call: mom  1. Spastic hemiplegia He is wheelchair confined and totally dependent on ADLs.  His mother provides total care.  No pressure sores  2. Stye Mother notes stye that presented about 2 weeks ago on left upper eyelid. She notes the bump has resolved but there is still some redness.  No swelling or pain. No discharge reported.   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, implant, and graft 02/06/2013   PNA (pneumonia) 12/09/2011   Traumatic brain injury [facility])     Current Outpatient Medications:    ascorbic acid (VITAMIN Scott) 1000 MG tablet, 1,000 mg by Per G Tube route daily., Disp: , Rfl:    cetirizine HCl (ZYRTEC) 5 MG/5ML SOLN, Take 5 mLs (5 mg total) by mouth daily., Disp: 150 mL, Rfl: prn   fluticasone (FLONASE) 50 MCG/ACT nasal spray, USE 2 SPRAYS IN EACH NOSTRIL ONCE DAILY., Disp: 16 g, Rfl: 5   Lactobacillus Rhamnosus, GG, (RA PROBIOTIC DIGESTIVE CARE) CAPS, Take 1 capsule by mouth daily., Disp: , Rfl:    Lactulose 20 GM/30ML SOLN, Place 15 mLs (10 g total) into feeding tube 3 (three) times daily as needed (constipation)., Disp: 450 mL, Rfl: prn    levETIRAcetam (KEPPRA) 100 MG/ML solution, TAKE TWO & ONE-HALF TEASPOONS BY MOUTH TWICE DAILY, Disp: 800 mL, Rfl: 5   polyethylene glycol powder (GLYCOLAX/MIRALAX) 17 GM/SCOOP powder, 17 g by Per G Tube route daily as needed for Constipation., Disp: , Rfl:    TEGRETOL 100 MG/5ML suspension, TAKE 16.5ML EVERY SIX HOURS, Disp: 2250 mL, Rfl: 5  Gen: nontoxic male in wheelchair HEENT: left upper lid with mild erythema.   Assessment/ Plan: 30 y.o. male   Spastic hemiplegia affecting nondominant side (HCC)  Spastic hemiplegia affecting dominant side (HCC)  At high risk for pressure injury of skin  Hordeolum externum of left upper eyelid - Plan: erythromycin ophthalmic ointment  Mild protein-calorie malnutrition (HCC)  Forms completed for wheelchair  Erythromycin to left upper lid.  Return if symptoms do not resolve follow up in office.  We reviewed his lab results.  Encouraged some protein incorporation into diet as tolerated.  Start time: 1:31pm End time: 1:45pm  Total time spent on patient care (including video visit/ documentation): 14 minutes  Scott Lorge Hulen Skains, DO [facility] [phone]

## 2021-01-30 ENCOUNTER — Telehealth (INDEPENDENT_AMBULATORY_CARE_PROVIDER_SITE_OTHER): Payer: Self-pay | Admitting: Pediatrics

## 2021-01-30 DIAGNOSIS — G40309 Generalized idiopathic epilepsy and epileptic syndromes, not intractable, without status epilepticus: Secondary | ICD-10-CM

## 2021-01-30 DIAGNOSIS — G8110 Spastic hemiplegia affecting unspecified side: Secondary | ICD-10-CM

## 2021-01-30 NOTE — Telephone Encounter (Signed)
[facility] called to confirm that we received order for pt for them to add protein supplement to pts tube feedings.  Says she faxed it to [place] on 10/24.  Can call [name] to request refax if we didn't receive it. [phone]
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
## 2021-01-31 IMAGING — CR DG ABDOMEN 1V
2 series · 2 of 2 positions shown · non-contrast
Comparison: 09/19/2013

CLINICAL DATA: Intrathecal baclofen pump insertion 08/17/2019

EXAM:
ABDOMEN - 1 VIEW

[x abdomen supine (1 of 2)]
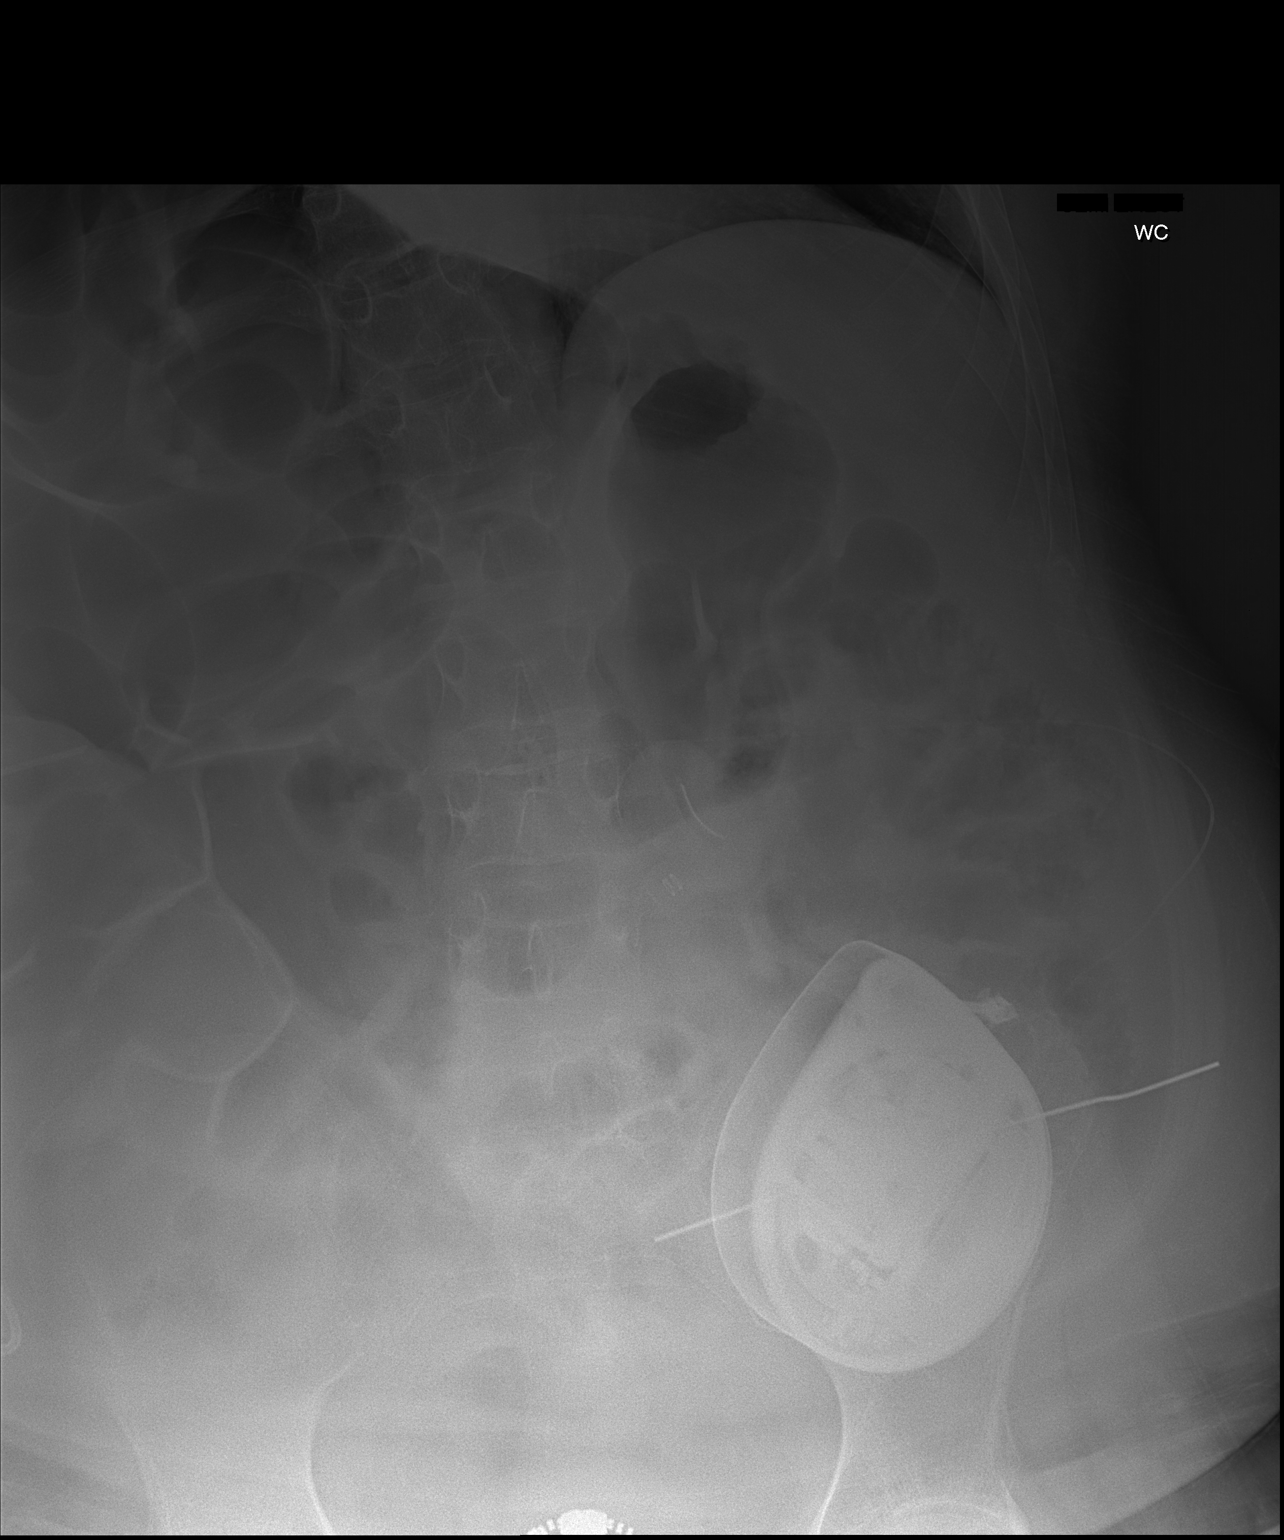

[x abdomen supine (2 of 2)]
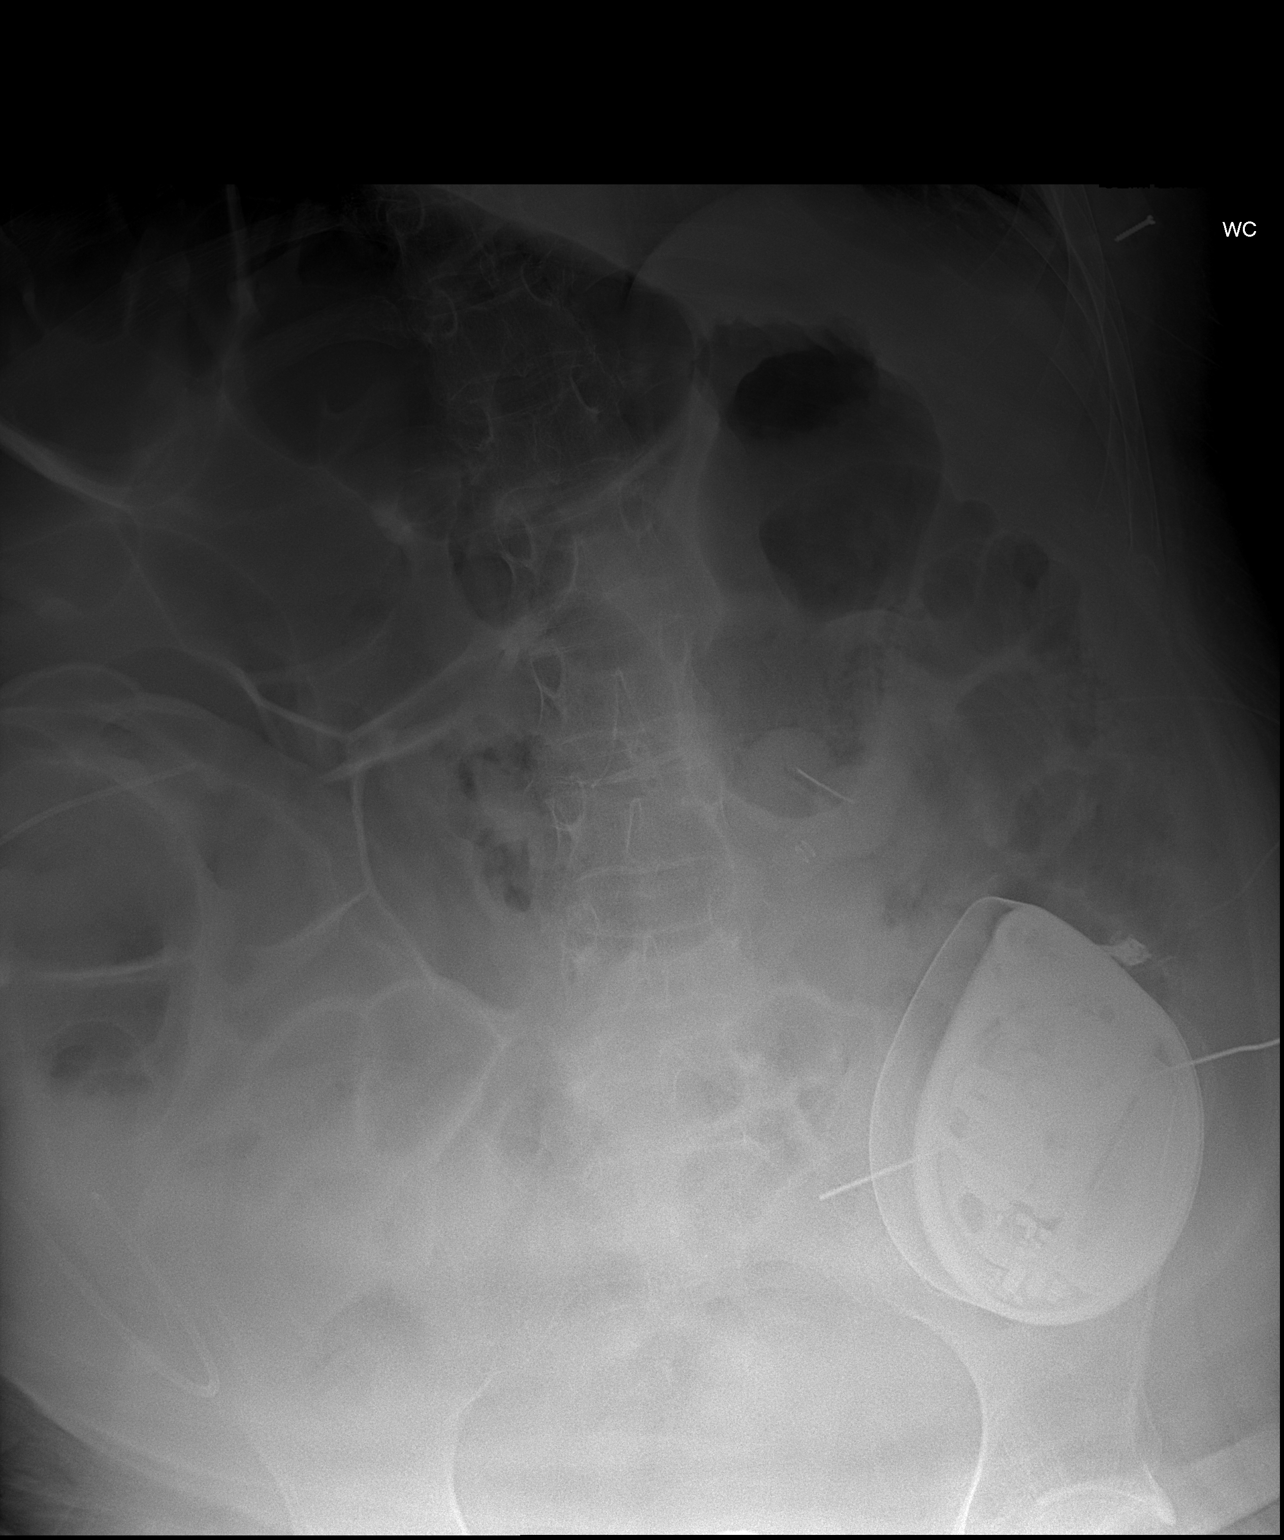

[2 of 2 positions shown; findings below may reference images not displayed]

FINDINGS: Surgical pump device again noted over the left lower quadrant. There
is a thin radiopaque lead extending to the midline of the lumbar
spine at approximately the L2-3 level, which appears essentially
unchanged compared to the prior radiograph of 09/19/2013. Evaluation
is somewhat limited secondary to underpenetration.

Gastrostomy tube projects over the left upper quadrant. Diffuse
gaseous distention of the colon. Partially visualized shunt catheter
within the right lower quadrant.
IMPRESSION: 1. Baclofen pump with lead extending to the midline of the lumbar
spine at approximately the L2-3 level, essentially unchanged
compared to 09/19/2013.
2. Diffuse gaseous distension of the colon, which could reflect
ileus.
3. Gastrostomy tube projects over the left upper quadrant.

## 2021-02-06 DIAGNOSIS — S06890A Other specified intracranial injury without loss of consciousness, initial encounter: Secondary | ICD-10-CM | POA: Diagnosis not present

## 2021-02-19 DIAGNOSIS — T85733A Infection and inflammatory reaction due to implanted electronic neurostimulator of spinal cord, electrode (lead), initial encounter: Secondary | ICD-10-CM | POA: Diagnosis not present

## 2021-02-19 DIAGNOSIS — G911 Obstructive hydrocephalus: Secondary | ICD-10-CM | POA: Diagnosis not present

## 2021-02-19 DIAGNOSIS — S06360D Traumatic hemorrhage of cerebrum, unspecified, without loss of consciousness, subsequent encounter: Secondary | ICD-10-CM | POA: Diagnosis not present

## 2021-02-19 DIAGNOSIS — S06360A Traumatic hemorrhage of cerebrum, unspecified, without loss of consciousness, initial encounter: Secondary | ICD-10-CM | POA: Diagnosis not present

## 2021-02-20 DIAGNOSIS — G911 Obstructive hydrocephalus: Secondary | ICD-10-CM | POA: Diagnosis not present

## 2021-02-20 DIAGNOSIS — T85733A Infection and inflammatory reaction due to implanted electronic neurostimulator of spinal cord, electrode (lead), initial encounter: Secondary | ICD-10-CM | POA: Diagnosis not present

## 2021-02-20 DIAGNOSIS — S06360D Traumatic hemorrhage of cerebrum, unspecified, without loss of consciousness, subsequent encounter: Secondary | ICD-10-CM | POA: Diagnosis not present

## 2021-02-20 DIAGNOSIS — S06360A Traumatic hemorrhage of cerebrum, unspecified, without loss of consciousness, initial encounter: Secondary | ICD-10-CM | POA: Diagnosis not present

## 2021-02-26 ENCOUNTER — Other Ambulatory Visit: Payer: Self-pay

## 2021-02-26 ENCOUNTER — Other Ambulatory Visit: Payer: Medicaid Other

## 2021-02-26 ENCOUNTER — Other Ambulatory Visit (INDEPENDENT_AMBULATORY_CARE_PROVIDER_SITE_OTHER): Payer: Self-pay

## 2021-02-26 ENCOUNTER — Telehealth (INDEPENDENT_AMBULATORY_CARE_PROVIDER_SITE_OTHER): Payer: Self-pay | Admitting: Pediatrics

## 2021-02-26 DIAGNOSIS — G40309 Generalized idiopathic epilepsy and epileptic syndromes, not intractable, without status epilepticus: Secondary | ICD-10-CM

## 2021-02-26 NOTE — Addendum Note (Signed)
Addended by: Lezlie Lye on: 02/26/2021 04:32 PM   Modules accepted: Orders

## 2021-02-26 NOTE — Addendum Note (Signed)
Addended by: Lezlie Lye on: 02/26/2021 04:43 PM   Modules accepted: Orders

## 2021-02-26 NOTE — Telephone Encounter (Signed)
Printed CBC order and added keppra and tegretol level (should be morning blood draw before his morning dose).  Please e-mail it to family.   Thanks  Lezlie Lye, MD

## 2021-02-26 NOTE — Telephone Encounter (Signed)
Who's calling (name and relationship to patient) : [facility]  Best contact number: [phone]  Provider they see: Dr. Mervyn Skeeters   Reason for call: Needs lab orders put in for [facility] and orders need to be faxed Fax: [phone]  Call ID:      PRESCRIPTION REFILL ONLY  Name of prescription:  Pharmacy:

## 2021-02-26 NOTE — Addendum Note (Signed)
Addended by: Lezlie Lye on: 02/26/2021 04:59 PM   Modules accepted: Orders

## 2021-02-27 DIAGNOSIS — S06360D Traumatic hemorrhage of cerebrum, unspecified, without loss of consciousness, subsequent encounter: Secondary | ICD-10-CM | POA: Diagnosis not present

## 2021-02-27 DIAGNOSIS — T85733A Infection and inflammatory reaction due to implanted electronic neurostimulator of spinal cord, electrode (lead), initial encounter: Secondary | ICD-10-CM | POA: Diagnosis not present

## 2021-02-27 DIAGNOSIS — G911 Obstructive hydrocephalus: Secondary | ICD-10-CM | POA: Diagnosis not present

## 2021-02-27 DIAGNOSIS — S06360A Traumatic hemorrhage of cerebrum, unspecified, without loss of consciousness, initial encounter: Secondary | ICD-10-CM | POA: Diagnosis not present

## 2021-02-27 NOTE — Addendum Note (Signed)
Addended by: Lezlie Lye on: 02/27/2021 09:18 AM   Modules accepted: Orders

## 2021-02-27 NOTE — Telephone Encounter (Signed)
Please include cbc on the orders for [facility] today would be great because mom would like to complete the order before next appt.

## 2021-02-27 NOTE — Telephone Encounter (Signed)
Informed Dr A, she will put orders in for CBC.

## 2021-03-04 ENCOUNTER — Other Ambulatory Visit: Payer: Medicaid Other

## 2021-03-04 ENCOUNTER — Other Ambulatory Visit: Payer: Self-pay

## 2021-03-04 ENCOUNTER — Ambulatory Visit (INDEPENDENT_AMBULATORY_CARE_PROVIDER_SITE_OTHER): Payer: Medicaid Other | Admitting: Pediatrics

## 2021-03-04 DIAGNOSIS — S069X9S Unspecified intracranial injury with loss of consciousness of unspecified duration, sequela: Secondary | ICD-10-CM

## 2021-03-04 DIAGNOSIS — G40309 Generalized idiopathic epilepsy and epileptic syndromes, not intractable, without status epilepticus: Secondary | ICD-10-CM | POA: Diagnosis not present

## 2021-03-04 DIAGNOSIS — Z978 Presence of other specified devices: Secondary | ICD-10-CM

## 2021-03-04 DIAGNOSIS — G8110 Spastic hemiplegia affecting unspecified side: Secondary | ICD-10-CM
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
## 2021-03-04 NOTE — Patient Instructions (Addendum)
Will return January 9th, 2023.

## 2021-03-05 LAB — CBC WITH DIFFERENTIAL/PLATELET
Basophils Absolute: 0.1 10*3/uL (ref 0.0–0.2)
Basos: 1 %
EOS (ABSOLUTE): 0.3 10*3/uL (ref 0.0–0.4)
Eos: 4 %
Hematocrit: 46.9 % (ref 37.5–51.0)
Hemoglobin: 15.6 g/dL (ref 13.0–17.7)
Immature Grans (Abs): 0 10*3/uL (ref 0.0–0.1)
Immature Granulocytes: 0 %
Lymphocytes Absolute: 1.6 10*3/uL (ref 0.7–3.1)
Lymphs: 24 %
MCH: 31.2 pg (ref 26.6–33.0)
MCHC: 33.3 g/dL (ref 31.5–35.7)
MCV: 94 fL (ref 79–97)
Monocytes Absolute: 0.7 10*3/uL (ref 0.1–0.9)
Monocytes: 10 %
Neutrophils Absolute: 4.2 10*3/uL (ref 1.4–7.0)
Neutrophils: 61 %
Platelets: 255 10*3/uL (ref 150–450)
RBC: 5 x10E6/uL (ref 4.14–5.80)
RDW: 12.8 % (ref 11.6–15.4)
WBC: 6.9 10*3/uL (ref 3.4–10.8)

## 2021-03-06 ENCOUNTER — Encounter (INDEPENDENT_AMBULATORY_CARE_PROVIDER_SITE_OTHER): Payer: Self-pay | Admitting: Pediatrics

## 2021-03-07 ENCOUNTER — Encounter (INDEPENDENT_AMBULATORY_CARE_PROVIDER_SITE_OTHER): Payer: Self-pay | Admitting: Pediatrics

## 2021-03-07 LAB — CARBAMAZEPINE LEVEL, TOTAL: Carbamazepine (Tegretol), S: 11.6 ug/mL (ref 4.0–12.0)

## 2021-03-07 LAB — LEVETIRACETAM LEVEL: Levetiracetam Lvl: 6.2 ug/mL — ABNORMAL LOW (ref 10.0–40.0)

## 2021-03-15 ENCOUNTER — Encounter (INDEPENDENT_AMBULATORY_CARE_PROVIDER_SITE_OTHER): Payer: Self-pay | Admitting: Pediatrics

## 2021-03-15 DIAGNOSIS — G40309 Generalized idiopathic epilepsy and epileptic syndromes, not intractable, without status epilepticus: Secondary | ICD-10-CM

## 2021-03-22 DIAGNOSIS — S06360A Traumatic hemorrhage of cerebrum, unspecified, without loss of consciousness, initial encounter: Secondary | ICD-10-CM | POA: Diagnosis not present

## 2021-03-22 DIAGNOSIS — G911 Obstructive hydrocephalus: Secondary | ICD-10-CM | POA: Diagnosis not present

## 2021-03-22 DIAGNOSIS — S06360D Traumatic hemorrhage of cerebrum, unspecified, without loss of consciousness, subsequent encounter: Secondary | ICD-10-CM | POA: Diagnosis not present

## 2021-03-22 DIAGNOSIS — T85733A Infection and inflammatory reaction due to implanted electronic neurostimulator of spinal cord, electrode (lead), initial encounter: Secondary | ICD-10-CM | POA: Diagnosis not present

## 2021-04-02 DIAGNOSIS — T85733A Infection and inflammatory reaction due to implanted electronic neurostimulator of spinal cord, electrode (lead), initial encounter: Secondary | ICD-10-CM | POA: Diagnosis not present

## 2021-04-02 DIAGNOSIS — S06360A Traumatic hemorrhage of cerebrum, unspecified, without loss of consciousness, initial encounter: Secondary | ICD-10-CM | POA: Diagnosis not present

## 2021-04-02 DIAGNOSIS — S06360D Traumatic hemorrhage of cerebrum, unspecified, without loss of consciousness, subsequent encounter: Secondary | ICD-10-CM | POA: Diagnosis not present

## 2021-04-02 DIAGNOSIS — G911 Obstructive hydrocephalus: Secondary | ICD-10-CM | POA: Diagnosis not present

## 2021-04-03 DIAGNOSIS — S06890A Other specified intracranial injury without loss of consciousness, initial encounter: Secondary | ICD-10-CM | POA: Diagnosis not present

## 2021-04-09 MED ORDER — LEVETIRACETAM 100 MG/ML PO SOLN: 1500.0000 mg | Freq: Two times a day (BID) | ORAL | 3 refills | Status: DC

## 2021-04-15 ENCOUNTER — Ambulatory Visit (INDEPENDENT_AMBULATORY_CARE_PROVIDER_SITE_OTHER): Payer: Medicaid Other | Admitting: Pediatrics

## 2021-04-15 ENCOUNTER — Other Ambulatory Visit: Payer: Self-pay

## 2021-04-15 DIAGNOSIS — G40309 Generalized idiopathic epilepsy and epileptic syndromes, not intractable, without status epilepticus: Secondary | ICD-10-CM | POA: Diagnosis not present

## 2021-04-15 DIAGNOSIS — G8110 Spastic hemiplegia affecting unspecified side: Secondary | ICD-10-CM

## 2021-04-15 DIAGNOSIS — S069X9S Unspecified intracranial injury with loss of consciousness of unspecified duration, sequela: Secondary | ICD-10-CM

## 2021-04-15 NOTE — Patient Instructions (Signed)
NEXT pump refill in May 27, 2021
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
## 2021-04-16 ENCOUNTER — Encounter (INDEPENDENT_AMBULATORY_CARE_PROVIDER_SITE_OTHER): Payer: Medicaid Other | Admitting: Pediatrics

## 2021-04-25 DIAGNOSIS — G911 Obstructive hydrocephalus: Secondary | ICD-10-CM | POA: Diagnosis not present

## 2021-04-25 DIAGNOSIS — S06360A Traumatic hemorrhage of cerebrum, unspecified, without loss of consciousness, initial encounter: Secondary | ICD-10-CM | POA: Diagnosis not present

## 2021-04-25 DIAGNOSIS — T85733A Infection and inflammatory reaction due to implanted electronic neurostimulator of spinal cord, electrode (lead), initial encounter: Secondary | ICD-10-CM | POA: Diagnosis not present

## 2021-04-25 DIAGNOSIS — S06360D Traumatic hemorrhage of cerebrum, unspecified, without loss of consciousness, subsequent encounter: Secondary | ICD-10-CM | POA: Diagnosis not present

## 2021-05-23 DIAGNOSIS — S06360D Traumatic hemorrhage of cerebrum, unspecified, without loss of consciousness, subsequent encounter: Secondary | ICD-10-CM | POA: Diagnosis not present

## 2021-05-23 DIAGNOSIS — T85733A Infection and inflammatory reaction due to implanted electronic neurostimulator of spinal cord, electrode (lead), initial encounter: Secondary | ICD-10-CM | POA: Diagnosis not present

## 2021-05-23 DIAGNOSIS — G911 Obstructive hydrocephalus: Secondary | ICD-10-CM | POA: Diagnosis not present

## 2021-05-23 DIAGNOSIS — S06360A Traumatic hemorrhage of cerebrum, unspecified, without loss of consciousness, initial encounter: Secondary | ICD-10-CM | POA: Diagnosis not present

## 2021-05-27 ENCOUNTER — Ambulatory Visit (INDEPENDENT_AMBULATORY_CARE_PROVIDER_SITE_OTHER): Payer: Medicaid Other | Admitting: Pediatrics

## 2021-05-27 ENCOUNTER — Telehealth (INDEPENDENT_AMBULATORY_CARE_PROVIDER_SITE_OTHER): Payer: Self-pay | Admitting: Pediatrics

## 2021-05-27 ENCOUNTER — Other Ambulatory Visit: Payer: Self-pay

## 2021-05-27 DIAGNOSIS — G8110 Spastic hemiplegia affecting unspecified side: Secondary | ICD-10-CM | POA: Diagnosis not present

## 2021-05-27 DIAGNOSIS — Z978 Presence of other specified devices: Secondary | ICD-10-CM

## 2021-05-27 DIAGNOSIS — S069X9S Unspecified intracranial injury with loss of consciousness of unspecified duration, sequela: Secondary | ICD-10-CM

## 2021-05-27 NOTE — Telephone Encounter (Signed)
Mom has requested a call back in regards of the pump she stated it just beeped.
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
## 2021-05-27 NOTE — Telephone Encounter (Signed)
°  Who's calling (name and relationship to patient) : Legrand Como - father  Best contact number: [phone]  Provider they see: Dr. Loni Muse  Reason for call: Patient is scheduled for pump refill tomorrow but his alarm is going off stating that he is out of medication. Father requests call back.     PRESCRIPTION REFILL ONLY  Name of prescription:  Pharmacy:

## 2021-05-28 ENCOUNTER — Encounter (INDEPENDENT_AMBULATORY_CARE_PROVIDER_SITE_OTHER): Payer: Medicaid Other | Admitting: Pediatrics

## 2021-05-29 ENCOUNTER — Encounter (INDEPENDENT_AMBULATORY_CARE_PROVIDER_SITE_OTHER): Payer: Medicaid Other | Admitting: Pediatrics

## 2021-06-03 ENCOUNTER — Encounter (INDEPENDENT_AMBULATORY_CARE_PROVIDER_SITE_OTHER): Payer: Medicaid Other | Admitting: Pediatrics

## 2021-06-17 ENCOUNTER — Telehealth (INDEPENDENT_AMBULATORY_CARE_PROVIDER_SITE_OTHER): Payer: Self-pay | Admitting: Pediatrics

## 2021-06-17 NOTE — Telephone Encounter (Signed)
?  Who's calling (name and relationship to patient) : Mother   ? ?Best contact number: [phone]  ? ?Provider they see: ?Dr. Jolyne Loa ?Reason for call Mom stated someone called her to set up an appointment with dr. Rogers Blocker I dont see anything in chart reflecting a call or anything in avs  for follow up with Dr. Rogers Blocker  ? ? ? ?PRESCRIPTION REFILL ONLY ? ?Name of prescription: ? ?Pharmacy: ? ? ? ?

## 2021-06-17 NOTE — Telephone Encounter (Signed)
Left voicemail for mom letting her know appointment is schedule Monday March 27th at 9am.  ?

## 2021-06-24 DIAGNOSIS — S06360A Traumatic hemorrhage of cerebrum, unspecified, without loss of consciousness, initial encounter: Secondary | ICD-10-CM | POA: Diagnosis not present

## 2021-06-24 DIAGNOSIS — G911 Obstructive hydrocephalus: Secondary | ICD-10-CM | POA: Diagnosis not present

## 2021-06-24 DIAGNOSIS — T85733A Infection and inflammatory reaction due to implanted electronic neurostimulator of spinal cord, electrode (lead), initial encounter: Secondary | ICD-10-CM | POA: Diagnosis not present

## 2021-06-24 DIAGNOSIS — S06360D Traumatic hemorrhage of cerebrum, unspecified, without loss of consciousness, subsequent encounter: Secondary | ICD-10-CM | POA: Diagnosis not present

## 2021-06-26 ENCOUNTER — Encounter: Payer: Self-pay | Admitting: Family Medicine

## 2021-07-01 ENCOUNTER — Encounter (INDEPENDENT_AMBULATORY_CARE_PROVIDER_SITE_OTHER): Payer: Self-pay | Admitting: Pediatrics

## 2021-07-01 ENCOUNTER — Other Ambulatory Visit: Payer: Self-pay

## 2021-07-01 ENCOUNTER — Ambulatory Visit (INDEPENDENT_AMBULATORY_CARE_PROVIDER_SITE_OTHER): Payer: Medicaid Other | Admitting: Pediatrics

## 2021-07-01 DIAGNOSIS — G8110 Spastic hemiplegia affecting unspecified side: Secondary | ICD-10-CM | POA: Diagnosis not present

## 2021-07-01 DIAGNOSIS — G40309 Generalized idiopathic epilepsy and epileptic syndromes, not intractable, without status epilepticus: Secondary | ICD-10-CM

## 2021-07-01 MED ORDER — LEVETIRACETAM 100 MG/ML PO SOLN: 1500.0000 mg | Freq: Two times a day (BID) | ORAL | 5 refills | Status: DC

## 2021-07-01 MED ORDER — TEGRETOL 100 MG/5ML PO SUSP: ORAL | 5 refills | Status: DC

## 2021-07-01 NOTE — Progress Notes (Signed)
? ?  Baclofen Pump Interrogation, Programming, Refill ? ?Date of Service: 07/01/2021    ? ?Patient Name: Scott Bean      ?MRN: [id number]      ?Date of Birth: 09-17-1990 ?Primary Care Physician: Janora Norlander, DO ? ?Provider: Carylon Perches MD MPH ?   ? ?Indications:      ?Time Out::  Done immediately prior to procedure ? ?Description:  ?The patient was placed in the supine position in wheelchair. The pump was interrogated and found to have a calculated residual volume of: 3 ml. There was  no redness or edema noted at the pump site. The pump reservoir site was prepped with betadine and draped using sterile technique per protocol. The pump was accessed on the third attempt using the 22 gauge needle provided in the refill kit. The residual baclofen was withdrawn and measured to be: 4.5 ml. The pump reservoir was refilled with 58ml of baclofen 2012mcg/ml  over 3 minute using sterile technique.  There was significant resistance at the end of the refill, so needle was repositioned slightly.  Medication drew back without blood and the remainder of medication was refilled without difficulty. The port was deaccessed and the skin cleaned. The pump was reprogrammed with new reservoir amount and the daily rate was 19.79mcg/hr (482mcg/day). There was minimal leakage of baclofen liquid from the site while preprogramming.  ? ?LOT no. for baclofen: 2144-117 ?Expiration date:May 2025 ? ?Low Reservoir Alarm Date: 08/11/21 ?Return day for next refill 08/08/21  ? ?PLAN: ?Return day scheduled for 08/08/21 at 12pm ?Continue Tegretol, Keppra at current doses.  Prescriptions sent today. ?If there are any complications, have patient come back to clinic.  Consider external pocket of baclofen given leakage and repositioning.    ? ?Carylon Perches MD MPH ?[facility] ?Neurology, Neurodevelopment and Neuropalliative care ? ?[place], [place], [place] [address] ?Phone: [phone]  ?

## 2021-07-02 ENCOUNTER — Other Ambulatory Visit (INDEPENDENT_AMBULATORY_CARE_PROVIDER_SITE_OTHER): Payer: Self-pay | Admitting: Family

## 2021-07-02 DIAGNOSIS — G40309 Generalized idiopathic epilepsy and epileptic syndromes, not intractable, without status epilepticus: Secondary | ICD-10-CM

## 2021-07-02 MED ORDER — TEGRETOL 100 MG/5ML PO SUSP: ORAL | 5 refills | Status: DC

## 2021-07-04 DIAGNOSIS — S06360D Traumatic hemorrhage of cerebrum, unspecified, without loss of consciousness, subsequent encounter: Secondary | ICD-10-CM | POA: Diagnosis not present

## 2021-07-04 DIAGNOSIS — S06360A Traumatic hemorrhage of cerebrum, unspecified, without loss of consciousness, initial encounter: Secondary | ICD-10-CM | POA: Diagnosis not present

## 2021-07-04 DIAGNOSIS — T85733A Infection and inflammatory reaction due to implanted electronic neurostimulator of spinal cord, electrode (lead), initial encounter: Secondary | ICD-10-CM | POA: Diagnosis not present

## 2021-07-04 DIAGNOSIS — G911 Obstructive hydrocephalus: Secondary | ICD-10-CM | POA: Diagnosis not present

## 2021-07-29 ENCOUNTER — Ambulatory Visit (INDEPENDENT_AMBULATORY_CARE_PROVIDER_SITE_OTHER): Payer: Medicaid Other | Admitting: Pediatrics

## 2021-08-05 DIAGNOSIS — S06360A Traumatic hemorrhage of cerebrum, unspecified, without loss of consciousness, initial encounter: Secondary | ICD-10-CM | POA: Diagnosis not present

## 2021-08-05 DIAGNOSIS — S06360D Traumatic hemorrhage of cerebrum, unspecified, without loss of consciousness, subsequent encounter: Secondary | ICD-10-CM | POA: Diagnosis not present

## 2021-08-05 DIAGNOSIS — G911 Obstructive hydrocephalus: Secondary | ICD-10-CM | POA: Diagnosis not present

## 2021-08-05 DIAGNOSIS — T85733A Infection and inflammatory reaction due to implanted electronic neurostimulator of spinal cord, electrode (lead), initial encounter: Secondary | ICD-10-CM | POA: Diagnosis not present

## 2021-08-08 ENCOUNTER — Ambulatory Visit (INDEPENDENT_AMBULATORY_CARE_PROVIDER_SITE_OTHER): Payer: Medicaid Other | Admitting: Pediatrics

## 2021-08-08 DIAGNOSIS — G8110 Spastic hemiplegia affecting unspecified side: Secondary | ICD-10-CM | POA: Diagnosis not present

## 2021-08-08 NOTE — Progress Notes (Addendum)
   Baclofen Pump Interrogation, Programming, Refill  Date of Service:08/08/21     Patient Name: Scott Bean      MRN: [id number]      Date of Birth: 1990/12/09 Primary Care Physician: Janora Norlander, DO  Provider: Carylon Perches MD MPH  HPI: Mother reports no concerns since last appointment.     Indications:      Time Out::  Done immediately prior to procedure  Description:  The patient was placed in the supine position. The pump was interrogated and found to have a calculated residual volume of: 3 ml. There was  no redness or edema noted at the pump site. The pump reservoir site was prepped with betadine and draped using sterile technique per protocol. The pump was accessed on the  first attempt using the 22 gauge needle provided in the refill kit. The residual baclofen was withdrawn and measured to be: 7 ml. The pump reservoir was refilled with 40 ml of baclofen 51mg/ml  over 2 minutes using sterile technique, deaccessed and the skin cleaned. The pump was reprogrammed with new reservoir amount and the daily rate was 463.043m.  LOT no. for baclofen: 2144-117 Expiration date:May 20[place] Date: 09/18/21 Return day for next refill 09/16/21  IT Baclofen infusion rate: 19.2960mhr   PLAN: Return day for next refill scheduled for 09/16/21 at 9:30am Medications reviewed, no refills needed.  Continue Tegretol and Keppra at current doses.       SteCarylon Perches MPH [facility] Neurology, Neurodevelopment and Neu[facility] [id number]: [phone]

## 2021-08-16 ENCOUNTER — Encounter: Payer: Self-pay | Admitting: Family Medicine

## 2021-08-16 ENCOUNTER — Telehealth (INDEPENDENT_AMBULATORY_CARE_PROVIDER_SITE_OTHER): Payer: Medicaid Other | Admitting: Family Medicine

## 2021-08-16 DIAGNOSIS — G8110 Spastic hemiplegia affecting unspecified side: Secondary | ICD-10-CM

## 2021-08-16 DIAGNOSIS — N3942 Incontinence without sensory awareness: Secondary | ICD-10-CM | POA: Diagnosis not present

## 2021-08-16 DIAGNOSIS — R159 Full incontinence of feces: Secondary | ICD-10-CM | POA: Diagnosis not present

## 2021-08-16 NOTE — Progress Notes (Signed)
MyChart Video visit ? ?Subjective: ?CC: incontinence supplies ?PCP: Janora Norlander, DO ?RP:[id number] Scott Bean is a 31 y.o. male. Patient provides verbal consent for consult held via video. ? ?Due to COVID-19 pandemic this visit was conducted virtually. This visit type was conducted due to national recommendations for restrictions regarding the COVID-19 Pandemic (e.g. social distancing, sheltering in place) in an effort to limit this patient's exposure and mitigate transmission in our community. All issues noted in this document were discussed and addressed.  A physical exam was not performed with this format.  ? ?Location of patient: home ?Location of provider: [facility] ?Others present for call: mom ? ?1.  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  His mother provides this.  She reports both fecal and urinary incontinence.  She is reliant on extra-large adult diapers to help with incontinence, which she changes every 2-3 hours.  She denies any pressure sores or other skin breakdown in the groin.  She applies cream to the inner thighs in efforts to reduce any pressure. ? ? ?ROS: Per HPI ? ?No Known Allergies ?Past Medical History:  ?Diagnosis Date  ? Bacteremia 12/10/2011  ? Cellulitis 12/27/2012  ? 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 PNA (pneumonia) 12/09/2011  ? Traumatic brain injury   ? ? ?Current Outpatient Medications:  ?  ascorbic acid (VITAMIN Scott) 1000 MG tablet, 1,000 mg by Per G Tube route daily., Disp: , Rfl:  ?  cetirizine HCl (ZYRTEC) 5 MG/5ML SOLN, Take 5 mLs (5 mg total) by mouth daily., Disp: 150 mL, Rfl: prn ?  fluticasone (FLONASE) 50 MCG/ACT nasal spray, USE 2 SPRAYS IN EACH NOSTRIL ONCE DAILY., Disp: 16 g, Rfl: 5 ?  Lactobacillus Rhamnosus, GG, (RA PROBIOTIC DIGESTIVE CARE) CAPS, Take 1 capsule by mouth daily., Disp: , Rfl:  ?  Lactulose 20 GM/30ML SOLN, Place 15 mLs  (10 g total) into feeding tube 3 (three) times daily as needed (constipation)., Disp: 450 mL, Rfl: prn ?  levETIRAcetam (KEPPRA) 100 MG/ML solution, Take 15 mLs (1,500 mg total) by mouth 2 (two) times daily., Disp: 930 mL, Rfl: 5 ?  polyethylene glycol powder (GLYCOLAX/MIRALAX) 17 GM/SCOOP powder, 17 g by Per G Tube route daily as needed for Constipation., Disp: , Rfl:  ?  TEGRETOL 100 MG/5ML suspension, TAKE 16.5ML EVERY SIX HOURS, Disp: 2250 mL, Rfl: 5 ? ?Gen: Awake and reclined in medical chair ? ?Assessment/ Plan: ?31 y.o. male  ? ?Spastic hemiplegia affecting nondominant side ([place]) ? ?Spastic hemiplegia affecting dominant side ([place]) ? ?Urinary incontinence without sensory awareness ? ?Full incontinence of feces ? ?Incontinence supplies will be renewed.  Form will be faxed today.  We will see him in office in the next few weeks for full physical exam and labs ? ?Start time: 3:18p ?End time: 3:29p ? ?Total time spent on patient care (including video visit/ documentation): 11 minutes ? ?Janora Norlander, DO ?[place] ?[phone] ? ?

## 2021-08-20 DIAGNOSIS — S06360D Traumatic hemorrhage of cerebrum, unspecified, without loss of consciousness, subsequent encounter: Secondary | ICD-10-CM | POA: Diagnosis not present

## 2021-08-20 DIAGNOSIS — T85733A Infection and inflammatory reaction due to implanted electronic neurostimulator of spinal cord, electrode (lead), initial encounter: Secondary | ICD-10-CM | POA: Diagnosis not present

## 2021-08-20 DIAGNOSIS — G911 Obstructive hydrocephalus: Secondary | ICD-10-CM | POA: Diagnosis not present

## 2021-08-20 DIAGNOSIS — S06360A Traumatic hemorrhage of cerebrum, unspecified, without loss of consciousness, initial encounter: Secondary | ICD-10-CM | POA: Diagnosis not present

## 2021-08-25 ENCOUNTER — Encounter (INDEPENDENT_AMBULATORY_CARE_PROVIDER_SITE_OTHER): Payer: Self-pay | Admitting: Pediatrics

## 2021-08-28 ENCOUNTER — Encounter: Payer: Medicaid Other | Admitting: Family Medicine

## 2021-09-03 ENCOUNTER — Other Ambulatory Visit: Payer: Self-pay | Admitting: Family Medicine

## 2021-09-04 ENCOUNTER — Encounter: Payer: Self-pay | Admitting: Family Medicine

## 2021-09-04 ENCOUNTER — Ambulatory Visit (INDEPENDENT_AMBULATORY_CARE_PROVIDER_SITE_OTHER): Payer: Medicaid Other | Admitting: Family Medicine

## 2021-09-04 VITALS — BP 104/74 | HR 66 | Temp 98.3°F | Resp 20

## 2021-09-04 DIAGNOSIS — N3942 Incontinence without sensory awareness: Secondary | ICD-10-CM

## 2021-09-04 DIAGNOSIS — L219 Seborrheic dermatitis, unspecified: Secondary | ICD-10-CM

## 2021-09-04 DIAGNOSIS — G8110 Spastic hemiplegia affecting unspecified side: Secondary | ICD-10-CM

## 2021-09-04 DIAGNOSIS — Z0000 Encounter for general adult medical examination without abnormal findings: Secondary | ICD-10-CM

## 2021-09-04 DIAGNOSIS — R159 Full incontinence of feces: Secondary | ICD-10-CM | POA: Diagnosis not present

## 2021-09-04 DIAGNOSIS — Z0001 Encounter for general adult medical examination with abnormal findings: Secondary | ICD-10-CM

## 2021-09-04 DIAGNOSIS — G40309 Generalized idiopathic epilepsy and epileptic syndromes, not intractable, without status epilepticus: Secondary | ICD-10-CM

## 2021-09-04 DIAGNOSIS — R7989 Other specified abnormal findings of blood chemistry: Secondary | ICD-10-CM | POA: Diagnosis not present

## 2021-09-04 DIAGNOSIS — Z931 Gastrostomy status: Secondary | ICD-10-CM | POA: Insufficient documentation

## 2021-09-04 DIAGNOSIS — Z9189 Other specified personal risk factors, not elsewhere classified: Secondary | ICD-10-CM

## 2021-09-04 MED ORDER — CLOTRIMAZOLE-BETAMETHASONE 1-0.05 % EX CREA: 1.0000 "application " | TOPICAL_CREAM | Freq: Every day | CUTANEOUS | 1 refills | Status: DC | PRN

## 2021-09-04 MED ORDER — KETOCONAZOLE 2 % EX SHAM: MEDICATED_SHAMPOO | CUTANEOUS | 2 refills | Status: DC

## 2021-09-04 NOTE — Progress Notes (Signed)
Scott Bean is a 31 y.o. male presents to office today for annual physical exam examination.    Concerns today include: 1.  Rash Patient with recurrent rash on the lower abdomen.  She has been applying Lotrisone cream to this area with minimal results.  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, implant, and graft 02/06/2013   PNA (pneumonia) 12/09/2011   Traumatic brain injury [facility])    Social History   Socioeconomic History   Marital status: Single    Spouse name: Not on file   Number of children: Not on file   Years of education: Not on file   Highest education level: Not on file  Occupational History   Not on file  Tobacco Use   Smoking status: Never   Smokeless tobacco: Never  Vaping Use   Vaping Use: Never used  Substance and Sexual Activity   Alcohol use: No   Drug use: No   Sexual activity: Never  Other Topics Concern   Not on file  Social History Narrative   Scott Bean is a 31 yo male.   Scott Bean graduated from [employer] in 2016.    He lives with his mother.   Social Determinants of Health   Financial Resource Strain: Not on file  Food Insecurity: Not on file  Transportation Needs: Not on file  Physical Activity: Not on file  Stress: Not on file  Social Connections: Not on file  Intimate Partner Violence: Not on file   Past Surgical History:  Procedure Laterality Date   decompressive craniotomy     implanation of intrathecal  baclofen pump     PEG TUBE PLACEMENT     reimplantation of intrathecal baclofen pump     VENTRICULOPERITONEAL SHUNT     Family History  Problem Relation Age of Onset   Heart Problems Maternal Grandfather        Died at 77   Lung cancer Maternal Grandmother        Died at 69   Liver cancer Maternal Grandmother     Current Outpatient Medications:    cetirizine HCl (ZYRTEC) 5 MG/5ML SOLN, Take 5 mLs (5 mg total) by mouth daily., Disp: 150 mL, Rfl: prn   clotrimazole-betamethasone (LOTRISONE) cream, Apply 1 application. topically daily as needed (rash)., Disp: 30 g, Rfl: 1   fluticasone (FLONASE) 50 MCG/ACT nasal spray, USE 2 SPRAYS IN EACH NOSTRIL ONCE DAILY., Disp: 16 g, Rfl: 5   ketoconazole (NIZORAL) 2 % shampoo, Lather affected areas and leave on for 10 mins. Then rinse.  Use twice weekly until rash resolves., Disp: 120 mL, Rfl: 2   Lactobacillus Rhamnosus, GG, (RA PROBIOTIC DIGESTIVE CARE) CAPS, Take 1 capsule by mouth daily., Disp: , Rfl:    Lactulose 20 GM/30ML SOLN, Place 15 mLs (10 g total) into feeding tube 3 (three) times daily as needed (constipation)., Disp: 450 mL, Rfl: prn   polyethylene glycol powder (GLYCOLAX/MIRALAX) 17 GM/SCOOP powder, 17 g by Per G Tube route daily as needed for Constipation., Disp: , Rfl:  ascorbic acid (VITAMIN C) 1000 MG tablet, 1,000 mg by Per G Tube route daily. (Patient not taking: Reported on 09/04/2021), Disp: , Rfl:    levETIRAcetam (KEPPRA) 100 MG/ML solution, Take 15 mLs (1,500 mg total) by mouth 2 (two) times daily., Disp: 930 mL, Rfl: 5   TEGRETOL 100 MG/5ML suspension, TAKE 16.5ML EVERY SIX HOURS (Patient not taking: Reported on 09/04/2021), Disp: 2250 mL, Rfl: 5  No Known Allergies   ROS: Review of Systems A comprehensive review of systems was negative except for: Eyes: positive for visual disturbance Gastrointestinal: positive for PEG Tube, incontinence Genitourinary: positive for urinary incontinence Integument/breast: positive for  rash Musculoskeletal: positive for spasticity, wheelchair dependence Allergic/Immunologic: positive for seasonal allergies    Physical exam BP 104/74   Pulse 66   Temp 98.3 F (36.8 C) (Temporal)   Resp 20   SpO2 97%  General appearance: alert and no distress Head:  Postsurgical/posttraumatic scarring along the scalp Eyes:  Sluggish pupillary reaction. Ears: normal TM's and external ear canals both ears Nose: Nares normal. Septum midline. Mucosa normal. No drainage or sinus tenderness. Throat:  Mucous membranes are moist Neck: no adenopathy Back:  Spastic hemiplegia.  Arrives in wheelchair Lungs: clear to auscultation bilaterally Chest wall: no tenderness Heart: regular rate and rhythm, S1, S2 normal, no murmur, click, rub or gallop Abdomen:  Some firmness in the left lower quadrant.  He has PEG tube in place on the left side.  There is some oozing and irritation around the PEG tube notable but no evidence of secondary infection Extremities:  Muscle spasticity.  Poor tone of the legs Pulses: +1 pedal pulses.  Cool extremities Skin:  No skin breakdown.  He does have a scaly, greasy looking rash along the left lower abdomen Lymph nodes: Cervical, supraclavicular, and axillary nodes normal. Neurologic: Nonverbal.  Assessment/ Plan: Scott Bean here for annual physical exam.   Annual physical exam  Spastic hemiplegia affecting nondominant side (HCC)  Spastic hemiplegia affecting dominant side (HCC)  Urinary incontinence without sensory awareness  Full incontinence of feces  At high risk for pressure injury of skin  Generalized convulsive epilepsy ([place]) - Plan: CMP14+EGFR, CBC, Carbamazepine level, total, Levetiracetam level  S/P percutaneous endoscopic gastrostomy (PEG) tube placement (HCC)  Low serum T4 level - Plan: TSH, Lipid Panel, T4, Free  Seborrheic dermatitis - Plan: ketoconazole (NIZORAL) 2 % shampoo, clotrimazole-betamethasone (LOTRISONE) cream  No  evidence of skin breakdown or other complications given spastic hemiplegia.  He was able to get his incontinence supplies without difficulty and hopefully these will be good for 1 year.  His mother will contact me if needed prior to that recheck  I have collected Keppra and carbamazepine levels for the patient's neurologist.  CBC and CMP also collected  Has some irritation and what appears to be a seborrheic dermatitis on the lower left abdomen near the PEG tube.  Going to trial him on some ketoconazole shampoo topically and I have renewed the clotrimazole betamethasone as a backup  History of low T4.  We will look for any abnormalities within the thyroid levels  Patient to follow up in 1 year for annual exam or sooner if needed.  Saudia Smyser M. Lajuana Ripple, DO

## 2021-09-04 NOTE — Patient Instructions (Signed)
Seborrheic Dermatitis, Adult Seborrheic dermatitis is a skin disease that causes red, scaly patches. It usually occurs on the scalp, and it is often called dandruff. The patches may appear on other parts of the body. Skin patches tend to appear where there are many oil glands in the skin. Areas of the body that are commonly affected include the: Scalp. Ears. Eyebrows. Face. Bearded area of men's faces. Skin folds of the body, such as the armpits, groin, and buttocks. Chest. The condition may come and go for no known reason, and it is often long-lasting (chronic). What are the causes? The cause of this condition is not known. What increases the risk? The following factors may make you more likely to develop this condition: Having certain conditions, such as: HIV (human immunodeficiency virus). AIDS (acquired immunodeficiency syndrome). Parkinson's disease. Mood disorders, such as depression. Being 40-60 years old. What are the signs or symptoms? Symptoms of this condition include: Thick scales on the scalp. Redness on the face or in the armpits. Skin that is flaky. The flakes may be white or yellow. Skin that seems oily or dry but is not helped with moisturizers. Itching or burning in the affected areas. How is this diagnosed? This condition is diagnosed with a medical history and physical exam. A sample of your skin may be tested (skin biopsy). You may need to see a skin specialist (dermatologist). How is this treated? There is no cure for this condition, but treatment can help to manage the symptoms. You may get treatment to remove scales, lower the risk of skin infection, and reduce swelling or itching. Treatment may include: Creams that reduce skin yeast. Medicated shampoo. Moisturizing creams or ointments. Creams that reduce swelling and irritation (steroids). Follow these instructions at home: Apply over-the-counter and prescription medicines only as told by your health care  provider. Use any medicated shampoo, skin creams, or ointments only as told by your health care provider. Keep all follow-up visits as told by your health care provider. This is important. Contact a health care provider if: Your symptoms do not improve with treatment. Your symptoms get worse. You have new symptoms. Get help right away if: Your condition rapidly worsens with treatment. Summary Seborrheic dermatitis is a skin disease that causes red, scaly patches. Seborrheic dermatitis commonly affects the scalp, face, and skin folds. There is no cure for this condition, but treatment can help to manage the symptoms. This information is not intended to replace advice given to you by your health care provider. Make sure you discuss any questions you have with your health care provider. Document Revised: 12/30/2018 Document Reviewed: 12/30/2018 Elsevier Patient Education  2023 Elsevier Inc.  

## 2021-09-05 ENCOUNTER — Encounter: Payer: Self-pay | Admitting: Family Medicine

## 2021-09-05 LAB — LIPID PANEL
Chol/HDL Ratio: 3.7 ratio (ref 0.0–5.0)
Cholesterol, Total: 217 mg/dL — ABNORMAL HIGH (ref 100–199)
HDL: 59 mg/dL (ref >39)
LDL Chol Calc (NIH): 116 mg/dL — ABNORMAL HIGH (ref 0–99)
Triglycerides: 245 mg/dL — ABNORMAL HIGH (ref 0–149)
VLDL Cholesterol Cal: 42 mg/dL — ABNORMAL HIGH (ref 5–40)

## 2021-09-05 LAB — CMP14+EGFR
ALT: 47 IU/L — ABNORMAL HIGH (ref 0–44)
AST: 10 IU/L (ref 0–40)
Albumin/Globulin Ratio: 1.2 (ref 1.2–2.2)
Albumin: 4.3 g/dL (ref 4.1–5.2)
Alkaline Phosphatase: 164 IU/L — ABNORMAL HIGH (ref 44–121)
BUN/Creatinine Ratio: 31 — ABNORMAL HIGH (ref 9–20)
BUN: 11 mg/dL (ref 6–20)
Bilirubin Total: <0.2 mg/dL (ref 0.0–1.2)
CO2: 23 mmol/L (ref 20–29)
Calcium: 9.1 mg/dL (ref 8.7–10.2)
Chloride: 97 mmol/L (ref 96–106)
Creatinine, Ser: 0.36 mg/dL — ABNORMAL LOW (ref 0.76–1.27)
Globulin, Total: 3.5 g/dL (ref 1.5–4.5)
Glucose: 84 mg/dL (ref 70–99)
Potassium: 4.3 mmol/L (ref 3.5–5.2)
Sodium: 136 mmol/L (ref 134–144)
Total Protein: 7.8 g/dL (ref 6.0–8.5)
eGFR: 155 mL/min/{1.73_m2} (ref >59)

## 2021-09-05 LAB — CBC
Hematocrit: 48.6 % (ref 37.5–51.0)
Hemoglobin: 16.8 g/dL (ref 13.0–17.7)
MCH: 31.6 pg (ref 26.6–33.0)
MCHC: 34.6 g/dL (ref 31.5–35.7)
MCV: 92 fL (ref 79–97)
Platelets: 283 10*3/uL (ref 150–450)
RBC: 5.31 x10E6/uL (ref 4.14–5.80)
RDW: 13.4 % (ref 11.6–15.4)
WBC: 6.6 10*3/uL (ref 3.4–10.8)

## 2021-09-05 LAB — TSH: TSH: 1.76 u[IU]/mL (ref 0.450–4.500)

## 2021-09-05 LAB — LEVETIRACETAM LEVEL: Levetiracetam Lvl: 20.9 ug/mL (ref 10.0–40.0)

## 2021-09-05 LAB — T4, FREE: Free T4: 0.87 ng/dL (ref 0.82–1.77)

## 2021-09-05 LAB — CARBAMAZEPINE LEVEL, TOTAL: Carbamazepine (Tegretol), S: 16.8 ug/mL (ref 4.0–12.0)

## 2021-09-06 DIAGNOSIS — G911 Obstructive hydrocephalus: Secondary | ICD-10-CM | POA: Diagnosis not present

## 2021-09-06 DIAGNOSIS — T85733A Infection and inflammatory reaction due to implanted electronic neurostimulator of spinal cord, electrode (lead), initial encounter: Secondary | ICD-10-CM | POA: Diagnosis not present

## 2021-09-06 DIAGNOSIS — S06360A Traumatic hemorrhage of cerebrum, unspecified, without loss of consciousness, initial encounter: Secondary | ICD-10-CM | POA: Diagnosis not present

## 2021-09-06 DIAGNOSIS — S06360D Traumatic hemorrhage of cerebrum, unspecified, without loss of consciousness, subsequent encounter: Secondary | ICD-10-CM | POA: Diagnosis not present

## 2021-09-16 ENCOUNTER — Encounter: Payer: Self-pay | Admitting: Family Medicine

## 2021-09-16 ENCOUNTER — Ambulatory Visit (INDEPENDENT_AMBULATORY_CARE_PROVIDER_SITE_OTHER): Payer: Medicaid Other | Admitting: Pediatrics

## 2021-09-16 DIAGNOSIS — G40309 Generalized idiopathic epilepsy and epileptic syndromes, not intractable, without status epilepticus: Secondary | ICD-10-CM

## 2021-09-16 DIAGNOSIS — G8110 Spastic hemiplegia affecting unspecified side: Secondary | ICD-10-CM | POA: Diagnosis not present

## 2021-09-16 DIAGNOSIS — K9422 Gastrostomy infection: Secondary | ICD-10-CM

## 2021-09-16 DIAGNOSIS — Z931 Gastrostomy status: Secondary | ICD-10-CM | POA: Diagnosis not present

## 2021-09-16 NOTE — Progress Notes (Signed)
   Baclofen Pump Interrogation, Programming, Refill  Date of Service: 09/23/2021     Patient Name: Scott Bean      MRN: [id number]      Date of Birth: 1990/10/26 Primary Care Physician: Janora Norlander, DO  Provider: Carylon Perches MD MPH   HPI: Mother reports no concerns since last appointment.    Indications:      Time Out:: Done immediately prior to procedure  Description:  The patient was placed in the supine position. The pump was interrogated and found to have a calculated residual volume of: 3 ml. There was no redness or edema noted at the pump site, however patient continues to have cellulitis around gtube to abuts baclofen pump. The pump reservoir site was prepped with betadine and draped using sterile technique per protocol. The pump was accessed on the 4th attempt using the 22 gauge needle provided in the refill kit. The residual baclofen was withdrawn and measured to be: 7 ml. The pump reservoir was refilled with 40 of baclofen 519mg/ml  over 2 minute using sterile technique, deaccessed and the skin cleaned. The pump was reprogrammed with new reservoir amount and the daily rate was 463.071m/day  LOT no. for baclofen: 2144-117 Expiration date:May 20[place]arm Date: 10/27/21 Return day for next refill 10/24/21  IT Baclofen infusion rate 19.2933mhr   PLAN: Mother with multiple concerns regarding medications.  Next appointment schedule for pump refill and appointment to discuss.  Mother concerned for overfeeding.  Referral to nutrition today to review.  Confirmed patient has refills on Tegretol, Keppra. No need for refills before next appointment.  Recommend seeing dermatologist related to persistent cellulitis  SteCarylon Perches MPH [facility] Neurology, Neurodevelopment and NeuSur[facility] [id number]: [phone]

## 2021-09-17 ENCOUNTER — Other Ambulatory Visit: Payer: Self-pay | Admitting: Family Medicine

## 2021-09-17 DIAGNOSIS — R21 Rash and other nonspecific skin eruption: Secondary | ICD-10-CM

## 2021-09-17 DIAGNOSIS — Z931 Gastrostomy status: Secondary | ICD-10-CM

## 2021-09-23 ENCOUNTER — Encounter (INDEPENDENT_AMBULATORY_CARE_PROVIDER_SITE_OTHER): Payer: Self-pay | Admitting: Pediatrics

## 2021-09-26 DIAGNOSIS — T85733A Infection and inflammatory reaction due to implanted electronic neurostimulator of spinal cord, electrode (lead), initial encounter: Secondary | ICD-10-CM | POA: Diagnosis not present

## 2021-09-26 DIAGNOSIS — S06360A Traumatic hemorrhage of cerebrum, unspecified, without loss of consciousness, initial encounter: Secondary | ICD-10-CM | POA: Diagnosis not present

## 2021-09-26 DIAGNOSIS — G911 Obstructive hydrocephalus: Secondary | ICD-10-CM | POA: Diagnosis not present

## 2021-09-26 DIAGNOSIS — S06360D Traumatic hemorrhage of cerebrum, unspecified, without loss of consciousness, subsequent encounter: Secondary | ICD-10-CM | POA: Diagnosis not present

## 2021-10-09 DIAGNOSIS — S06360A Traumatic hemorrhage of cerebrum, unspecified, without loss of consciousness, initial encounter: Secondary | ICD-10-CM | POA: Diagnosis not present

## 2021-10-09 DIAGNOSIS — S06360D Traumatic hemorrhage of cerebrum, unspecified, without loss of consciousness, subsequent encounter: Secondary | ICD-10-CM | POA: Diagnosis not present

## 2021-10-09 DIAGNOSIS — T85733A Infection and inflammatory reaction due to implanted electronic neurostimulator of spinal cord, electrode (lead), initial encounter: Secondary | ICD-10-CM | POA: Diagnosis not present

## 2021-10-09 DIAGNOSIS — G911 Obstructive hydrocephalus: Secondary | ICD-10-CM | POA: Diagnosis not present

## 2021-10-10 ENCOUNTER — Other Ambulatory Visit: Payer: Self-pay | Admitting: Family Medicine

## 2021-10-10 DIAGNOSIS — L219 Seborrheic dermatitis, unspecified: Secondary | ICD-10-CM

## 2021-10-14 NOTE — Progress Notes (Signed)
This is a Pediatric Specialist E-Visit follow up consult provided via MyChart Video Visit. Scott Bean and their parent/guardian consented to an E-Visit consult today.  Location of patient: Scott Bean is at home.   Location of provider: Milana Obey, RD is at [facility]).  This visit was done via VIDEO   Medical Nutrition Therapy - Initial Assessment Appt start time: 8:17 AM Appt end time: 8:50 AM  Reason for referral: Gtube dependence Referring provider: Dr. Artis Flock - Neuro Pertinent medical hx: Spastic hemiplegia, epilepsy, obstructive hydrocephalus, TBI with loss of consciousness, cognitive decline, h/o gastrointestinal disease, transient alteration of awareness, urinary and fecal incontinence, high risk for pressure injury of skin, +gtube  Assessment: Food allergies: none Pertinent Medications: see medication list Vitamins/Supplements: none Pertinent labs:  (5/31) TSH, Free T4, CBC - WNL  (5/31) Lipid Panel: Total Cholesterol - 217 (high), TG - 245 (high), HDL - 59 (WNL), VLDL - 42 (high), LDL - 116 (high)  No anthropometrics taken on 7/24 due to virtual appointment. Most recent anthropometrics 7/20 were used to determine dietary needs.   (7/20) Anthropometrics: Ht: 200 cm    Wt: 105.4 kg (231 # 9.6 oz)   BMI: 26.0   IBW based on [place]wi Equation: 100 kg (+/- 10%)  UBW: ~200 #    Estimated minimum caloric needs: 1250-1350 kcal/day (10% decrease from baseline given weight gain with current regimen)  Estimated minimum protein needs: 0.8 g/kg/day (DRI) Estimated minimum fluid needs: 1250-1350 mL/kg/day (1 mL/kcal)  09/02/19 Wt: 194 # (88.2 kg)  Primary concerns today: Consult given pt with gtube dependence. Mom accompanied pt to virtual appt today.   Dietary Intake Hx: DME: [employer]   Formula: [name] Standard 1.4  Current regimen:  Day feeds: 325 mL (1 carton) via gravity feeds x 3 feeds  @ 8 AM, 1 PM, 6 PM (mom will give 1/2 then will  wait 30 minutes then other 1/2 of carton) Overnight feeds: none Total Volume: 975 mL (3 cartons)  FWF: 160 mL with each feed x3, 60 mL after each feed and after medications x4, 120 mL diet cranberry juice @ 6 AM, 180 mL water with Miralax (1200 mL total) Nutrition Supplement: 1 Prosource daily, 1 scoop Collagen daily Previous Supplements Tried: 2kcal HN (gas/bloating)   Feed positioning/location: 15-80 degree incline PO foods: none PO beverages: none   Notes: Mom notes that [name] has been on [employer] for about a year. Prior to [employer] he was on a 2 kcal HN formula for many years. [name] was switched to [employer] due to gas and bloating, however this has continued with his [name]. [name] was previously consuming some foods PO, however developed aspiration pneumonia and currently consumes nutrition only via gtube.    GI: daily, runny consistency - enema and Miralax daily  GU: "a lot" - very clear  Physical Activity: wheelchair bound  Estimated caloric intake: 1495 kcal/day - meets 111-120% of estimated needs Estimated protein intake: 0.9 g/kg/day - meets 113% of estimated needs Estimated fluid intake: 1902 mL/day - meets 141-152% of estimated needs  Micronutrient intake:  Vitamin A 1440 mcg  Vitamin C 150 mg  Vitamin D 21 mcg  Vitamin E 27 mg  Vitamin K 120 mcg  Vitamin B1 (thiamin) 1.5 mg  Vitamin B2 (riboflavin) 1.8 mg  Vitamin B3 (niacin) 21 mg  Vitamin B5 (pantothenic acid) 10.5 mg  Vitamin B6 2.1 mg  Vitamin B7 (biotin) 60 mcg  Vitamin B9 (  folate) 900 mcg  Vitamin B12 6.3 mcg  Choline 540 mg  Calcium 1050 mg  Chromium 90 mcg  Copper 2100 mcg  Fluoride 0 mg  Iodine 195 mcg  Iron 21 mg  Magnesium 420 mg  Manganese 2.4 mg  Molybdenum 150 mcg  Phosphorous 900 mg  Selenium 73.5 mcg  Zinc 18 mg  Potassium 2175 mg  Sodium 1050 mg  Chloride 1575 mg  Fiber 9 g    Nutrition Diagnosis: (7/24) Inadequate oral intake related to dysphagia and NPO status as evidenced  by pt dependent on Gtube feedings to meet nutritional needs. (7/24) Overweight related excess energy intake in the setting of hypometabolic state as evidenced by BMI of 26.0.  Intervention: Discussed pt's weight trends and current regimen. Discussed tolerance with current regimen and options to try should gas/bloating not improve -- [name] Peptide, Compleat Peptide Plant Based. Discussed recommendations below. All questions answered, family in agreement with plan.   Nutrition Recommendations sent via MyChart message: - Let's switch [name]'s regimen to:   2 [name]   1 [name] Standard 1.4   2 Prosources daily mixed with at least 60 mL water  - Continue water flushes and diet cranberry juice as is.  - We will continue to monitor [name]'s bloating and gas, should it not improve, we can try [employer] Peptide or [employer]. Feel free to look into these products as well.   This new regimen will provide: 1295 kcal/day, 0.9 g protein/kg/day, 1942 mL/day.  Teach back method used.  Monitoring/Evaluation: Continue to Monitor: - Weight trends  - TF tolerance  - Need for Peptide formula   Follow-up scheduled for 8/31 joint with Dr. Artis Flock.  Total time spent in counseling: 33 minutes.

## 2021-10-23 ENCOUNTER — Encounter (INDEPENDENT_AMBULATORY_CARE_PROVIDER_SITE_OTHER): Payer: Self-pay | Admitting: Pediatrics

## 2021-10-24 ENCOUNTER — Ambulatory Visit (INDEPENDENT_AMBULATORY_CARE_PROVIDER_SITE_OTHER): Payer: Medicaid Other | Admitting: Pediatrics

## 2021-10-24 VITALS — Wt 231.6 lb

## 2021-10-24 DIAGNOSIS — G8110 Spastic hemiplegia affecting unspecified side: Secondary | ICD-10-CM
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
## 2021-10-28 ENCOUNTER — Encounter (INDEPENDENT_AMBULATORY_CARE_PROVIDER_SITE_OTHER): Payer: Self-pay

## 2021-10-28 ENCOUNTER — Ambulatory Visit (INDEPENDENT_AMBULATORY_CARE_PROVIDER_SITE_OTHER): Payer: Medicaid Other | Admitting: Dietician

## 2021-10-28 ENCOUNTER — Encounter (INDEPENDENT_AMBULATORY_CARE_PROVIDER_SITE_OTHER): Payer: Self-pay | Admitting: Pediatrics

## 2021-10-28 ENCOUNTER — Encounter (INDEPENDENT_AMBULATORY_CARE_PROVIDER_SITE_OTHER): Payer: Self-pay | Admitting: Dietician

## 2021-10-28 ENCOUNTER — Telehealth (INDEPENDENT_AMBULATORY_CARE_PROVIDER_SITE_OTHER): Payer: Medicaid Other | Admitting: Pediatrics

## 2021-10-28 DIAGNOSIS — G40309 Generalized idiopathic epilepsy and epileptic syndromes, not intractable, without status epilepticus: Secondary | ICD-10-CM | POA: Diagnosis not present

## 2021-10-28 DIAGNOSIS — E663 Overweight: Secondary | ICD-10-CM

## 2021-10-28 DIAGNOSIS — Z931 Gastrostomy status: Secondary | ICD-10-CM | POA: Diagnosis not present

## 2021-10-28 DIAGNOSIS — G8110 Spastic hemiplegia affecting unspecified side: Secondary | ICD-10-CM

## 2021-10-28 DIAGNOSIS — S069X9S Unspecified intracranial injury with loss of consciousness of unspecified duration, sequela: Secondary | ICD-10-CM

## 2021-10-28 MED ORDER — NUTRITIONAL SUPPLEMENT PLUS PO LIQD: ORAL | 12 refills | Status: DC

## 2021-10-28 MED ORDER — BACLOFEN 5 MG PO TABS: ORAL_TABLET | ORAL | 0 refills | Status: DC

## 2021-10-28 NOTE — Progress Notes (Signed)
RD faxed updated orders for 2 [facility] Standard 1.0, 1 [employer] Standard 1.4 and 2 Prosources to [employer] @ [phone].

## 2021-10-28 NOTE — Patient Instructions (Addendum)
Referred to GI  Ordered lab work since he is on tegretol  Plan to adult PM&R to see if thery have further information on how to treat him, they can also make recommendations about getting him aug com ST. Provided some baclofen for you to talk homme incase there is a problem a with the baclofen pump. Referred to Outpatient ST rehab to assist with aug com.

## 2021-10-28 NOTE — Patient Instructions (Signed)
Nutrition Recommendations: - Let's switch Scott Bean's regimen to:   2 [name]   1 [name] Standard 1.4   2 Prosources daily mixed with at least 60 mL water  - Continue water flushes and diet cranberry juice as is.  - We will continue to monitor Scott Bean's bloating and gas, should it not improve, we can try [employer] Peptide or [employer]. Feel free to look into these products as well.

## 2021-10-29 DIAGNOSIS — L230 Allergic contact dermatitis due to metals: Secondary | ICD-10-CM | POA: Diagnosis not present

## 2021-10-29 DIAGNOSIS — L700 Acne vulgaris: Secondary | ICD-10-CM | POA: Diagnosis not present

## 2021-10-29 DIAGNOSIS — L821 Other seborrheic keratosis: Secondary | ICD-10-CM | POA: Diagnosis not present

## 2021-10-29 DIAGNOSIS — D2372 Other benign neoplasm of skin of left lower limb, including hip: Secondary | ICD-10-CM | POA: Diagnosis not present

## 2021-10-31 ENCOUNTER — Encounter: Payer: Self-pay | Admitting: Physical Medicine & Rehabilitation

## 2021-10-31 DIAGNOSIS — S06360D Traumatic hemorrhage of cerebrum, unspecified, without loss of consciousness, subsequent encounter: Secondary | ICD-10-CM | POA: Diagnosis not present

## 2021-10-31 DIAGNOSIS — G911 Obstructive hydrocephalus: Secondary | ICD-10-CM | POA: Diagnosis not present

## 2021-10-31 DIAGNOSIS — T85733A Infection and inflammatory reaction due to implanted electronic neurostimulator of spinal cord, electrode (lead), initial encounter: Secondary | ICD-10-CM | POA: Diagnosis not present

## 2021-10-31 DIAGNOSIS — S06360A Traumatic hemorrhage of cerebrum, unspecified, without loss of consciousness, initial encounter: Secondary | ICD-10-CM | POA: Diagnosis not present

## 2021-11-03 ENCOUNTER — Encounter (INDEPENDENT_AMBULATORY_CARE_PROVIDER_SITE_OTHER): Payer: Self-pay | Admitting: Pediatrics

## 2021-11-03 NOTE — Progress Notes (Signed)
   Baclofen Pump Interrogation, Programming, Refill  Date of Service: 11/03/2021     Patient Name: Scott Bean      MRN: [id number]      Date of Birth: 04/05/1991 Primary Care Physician: Janora Norlander, DO  Provider: Carylon Perches MD MPH   HPI: Mother reports no concerns since last appointment.  Patient scheduled to see dermatology regarding abdominal rash.    Indications:      Time Out::  Done immediately prior to procedure  Description:  The patient was placed in the supine position. The pump was interrogated and found to have a calculated residual volume of: 5 ml. There was no redness or edema noted at the pump site directly, however chronic rash location on abdomen. Prior to procedure, location of pump was estimated 5.3cm from medial line of scar and 7 mm down. The pump reservoir site was prepped with betadine and draped using sterile technique per protocol. The pump was accessed on the  first attempt using the 22 gauge needle provided in the refill kit. The residual baclofen was withdrawn and measured to be: 8 ml. The pump reservoir was refilled with 12ml of baclofen 553mcg/ml over 2 minute using sterile technique, deaccessed and the skin cleaned. The pump was reprogrammed with new reservoir amount and the daily rate was 463.47mcg  LOT no. for baclofen: 2144-117 Expiration date:08/2023  [facility] Alarm Date: 12/04/21 Return day for next refill 12/05/21  IT Baclofen infusion rate: 19.29 mcg/hr   PLAN: Return day for next refill scheduled, join with [name].  Patient to return 7/24 to establish neurology care Confirmed patient has refills, no need for refills before next appointment.   Carylon Perches MD MPH [facility] Neurology, Neurodevelopment and [facility]  [address], [place], [place] [address] Phone: [phone]

## 2021-11-10 DIAGNOSIS — T85733A Infection and inflammatory reaction due to implanted electronic neurostimulator of spinal cord, electrode (lead), initial encounter: Secondary | ICD-10-CM | POA: Diagnosis not present

## 2021-11-10 DIAGNOSIS — S06360D Traumatic hemorrhage of cerebrum, unspecified, without loss of consciousness, subsequent encounter: Secondary | ICD-10-CM | POA: Diagnosis not present

## 2021-11-10 DIAGNOSIS — S06360A Traumatic hemorrhage of cerebrum, unspecified, without loss of consciousness, initial encounter: Secondary | ICD-10-CM | POA: Diagnosis not present

## 2021-11-10 DIAGNOSIS — G911 Obstructive hydrocephalus: Secondary | ICD-10-CM | POA: Diagnosis not present

## 2021-11-10 NOTE — Addendum Note (Signed)
Addended by: Margurite Auerbach on: 11/10/2021 11:31 PM   Modules accepted: Orders

## 2021-11-11 IMAGING — CR DG CHEST 2V
2 series · 2 of 2 positions shown · non-contrast
Comparison: None.

CLINICAL DATA: Fatigue and bradycardia.

EXAM:
CHEST - 2 VIEW

[chest lat]
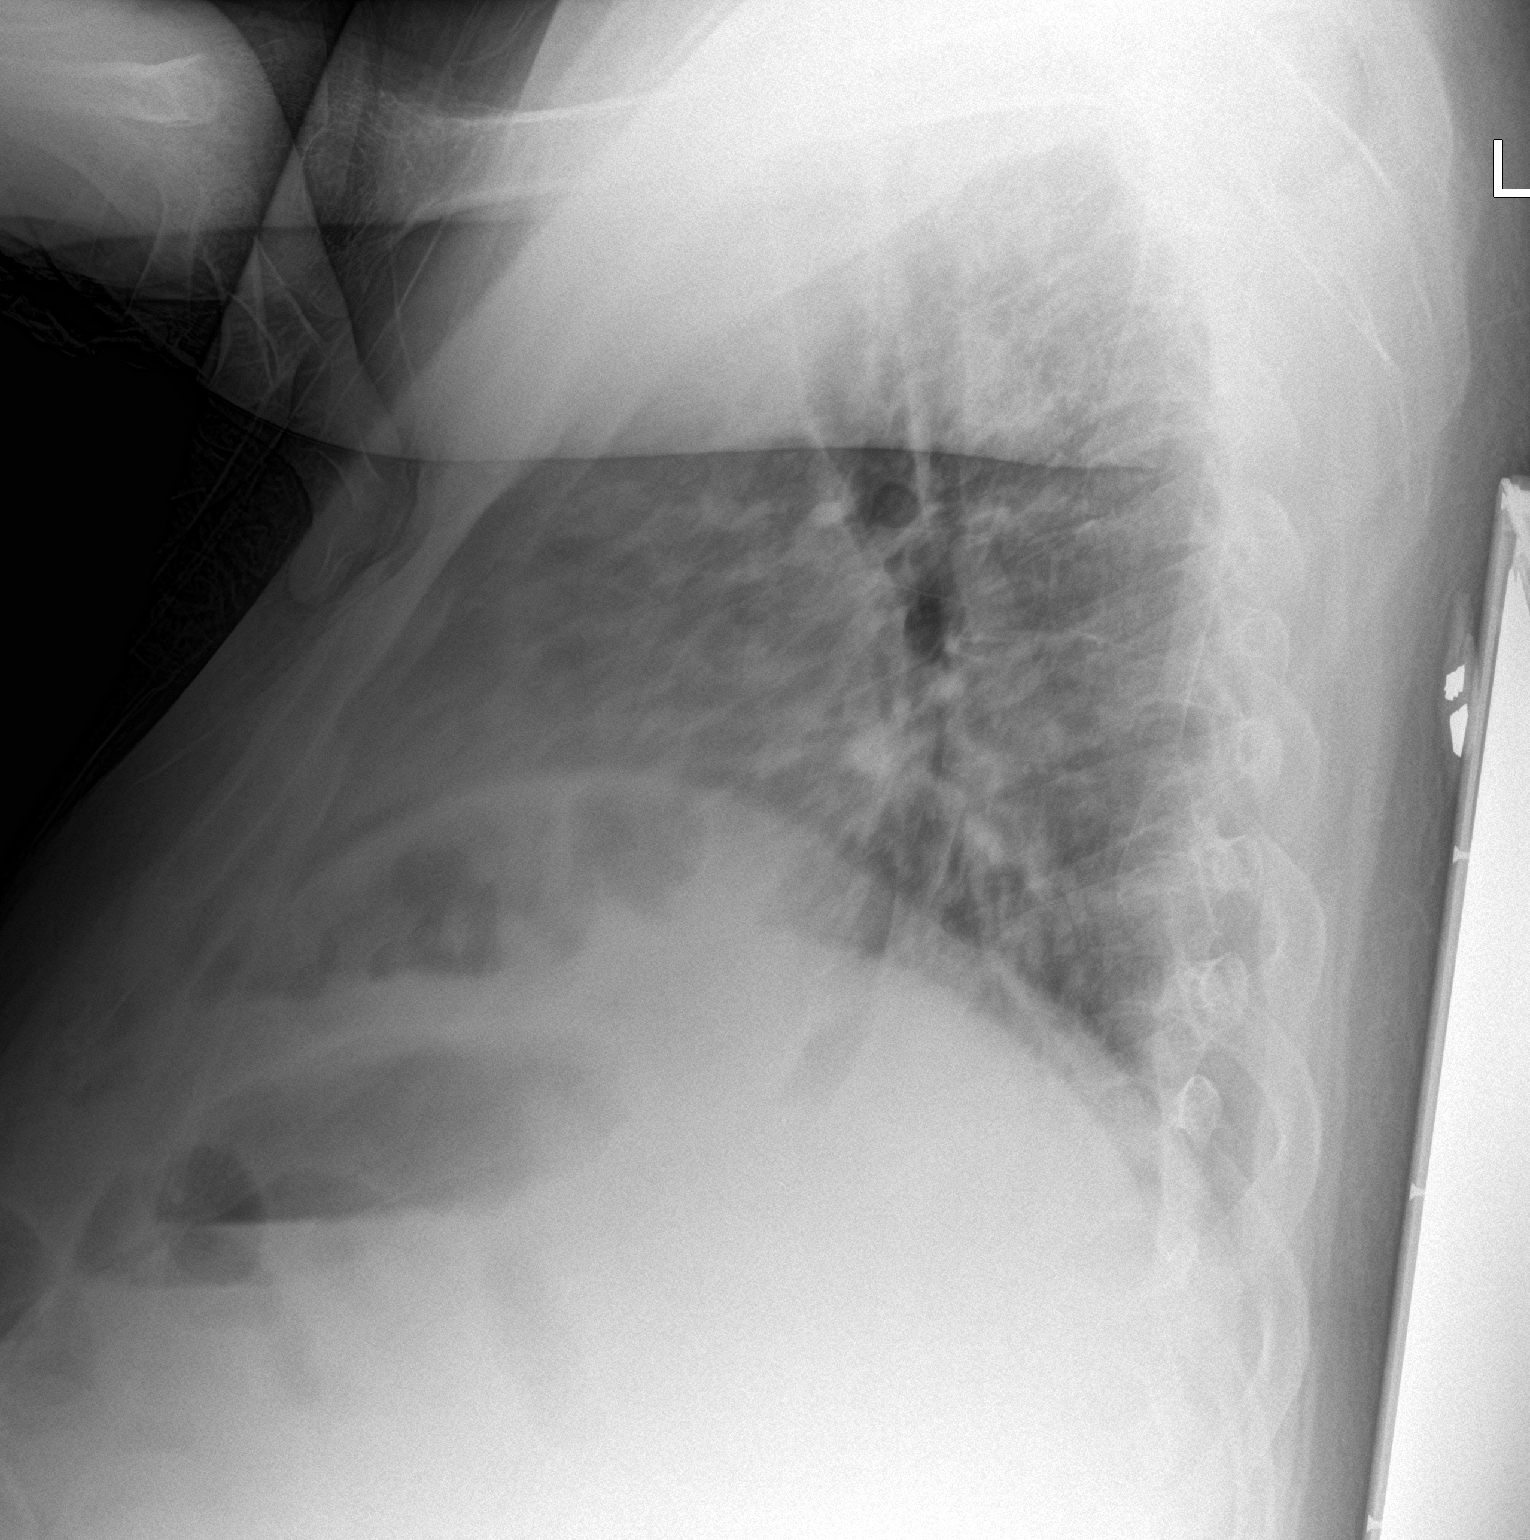

[chest ap]
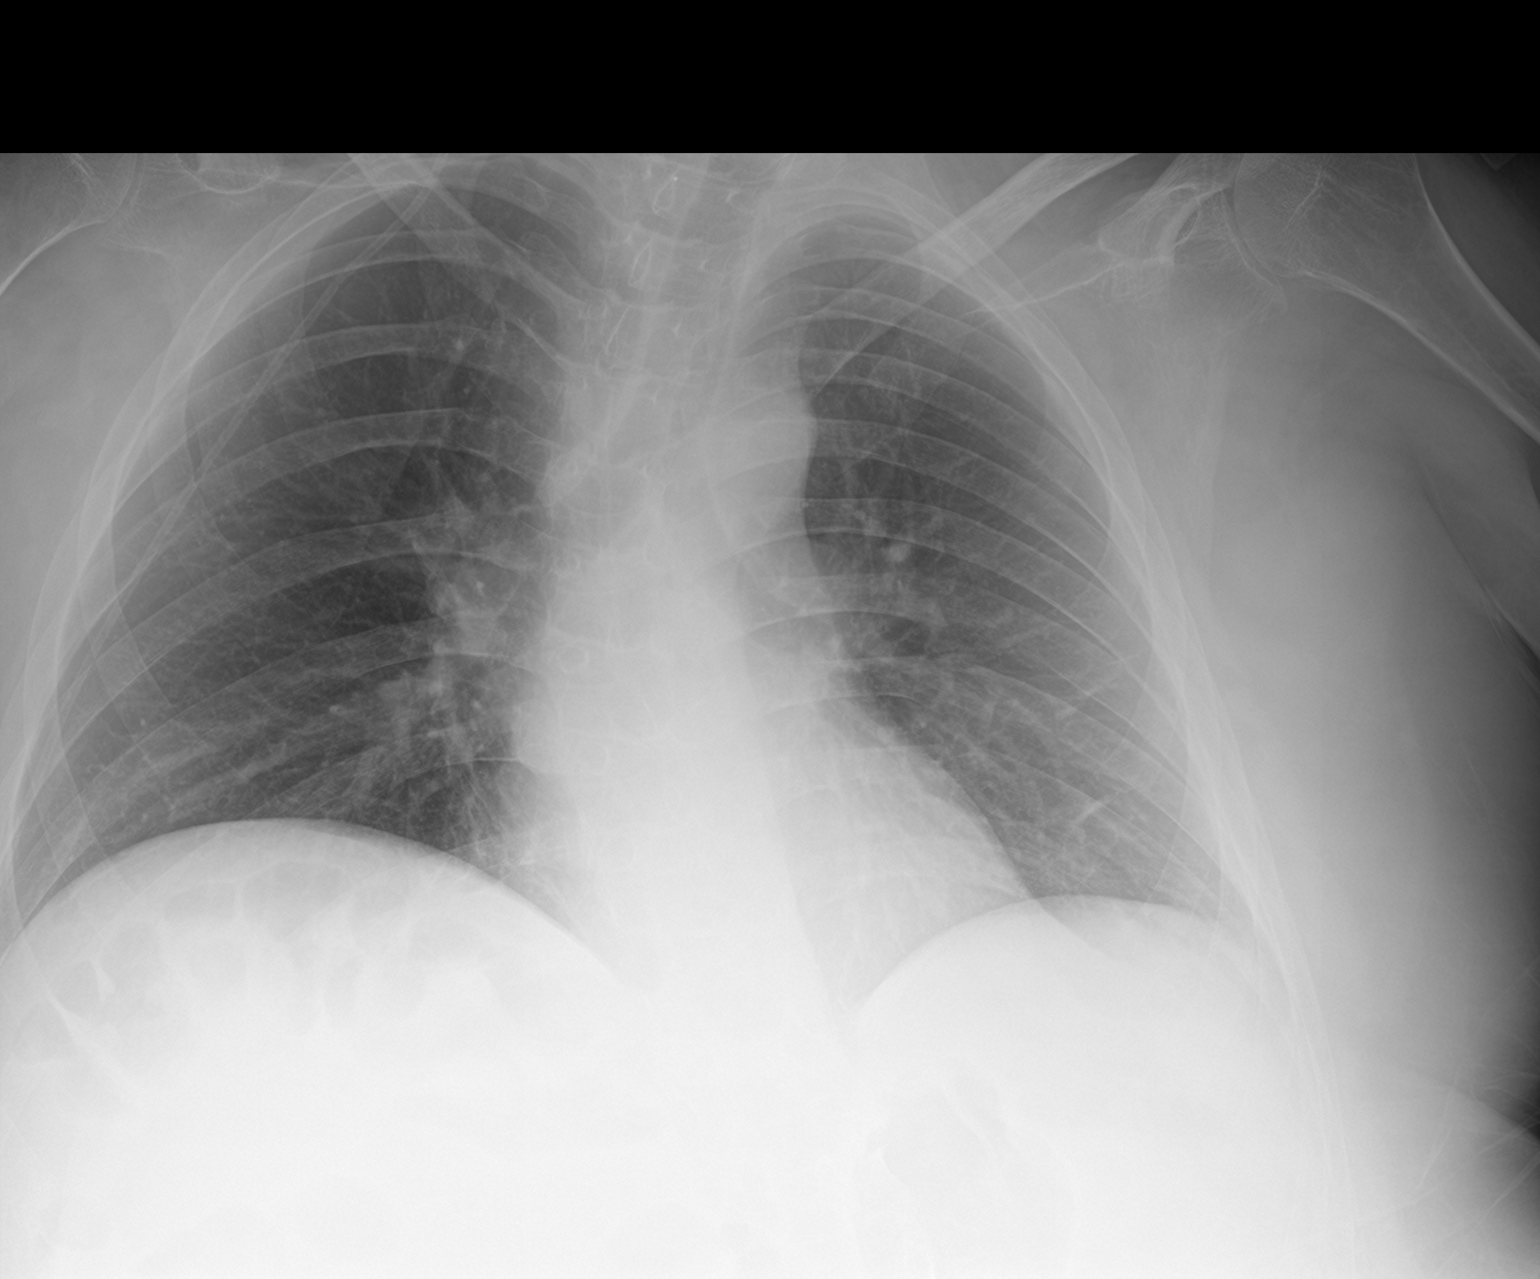

[2 of 2 positions shown; findings below may reference images not displayed]

FINDINGS: The cardiomediastinal silhouette is unremarkable.

There is no evidence of focal airspace disease, pulmonary edema,
suspicious pulmonary nodule/mass, pleural effusion, or pneumothorax.

No acute bony abnormalities are identified.
IMPRESSION: No active cardiopulmonary disease.

## 2021-11-21 NOTE — Progress Notes (Signed)
Medical Nutrition Therapy - Progress Note Appt start time: 11:29 AM  Appt end time: 11:49 AM  Reason for referral: Gtube dependence Referring provider: Dr. Artis Flock - Neuro Pertinent medical hx: Spastic hemiplegia, epilepsy, obstructive hydrocephalus, TBI with loss of consciousness, cognitive decline, h/o gastrointestinal disease, transient alteration of awareness, urinary and fecal incontinence, high risk for pressure injury of skin, +gtube  Assessment: Food allergies: none Pertinent Medications: see medication list Vitamins/Supplements: none Pertinent labs:  (5/31) TSH, Free T4, CBC - WNL  (5/31) Lipid Panel: Total Cholesterol - 217 (high), TG - 245 (high), HDL - 59 (WNL), VLDL - 42 (high), LDL - 116 (high)  (8/31) Anthropometrics: Ht: 200 cm    Wt: 104.2 kg (229 # 12.8 oz)   BMI: 25.8  IBW based on Hamwi Equation: 100 kg (+/- 10%)  (7/20) Anthropometrics: Ht: 200 cm    Wt: 105.4 kg (231 # 9.6 oz)   BMI: 26.0   IBW based on Hamwi Equation: 100 kg (+/- 10%)  UBW: ~200 #    Estimated minimum caloric needs: 1225 kcal/day (10% decrease from baseline given weight gain with current regimen)  Estimated minimum protein needs: 0.8 g/kg/day (DRI) Estimated minimum fluid needs: 1225 mL/day (1 mL/kcal)   10/24/21 Wt: 231 # 9.6 oz (105.4 kg) 09/02/19 Wt: 194 # (88.2 kg)  Primary concerns today: Follow-up given pt with gtube dependence. Mom accompanied pt to appt today.   Dietary Intake Hx: DME: [employer] [facility]   Formula: [name] Standard 1.4, [employer] Standard 1.0 Current regimen:  Day feeds: 325 mL (1 carton) via gravity feeds x 3 feeds  @ 8 AM, 1 PM, 6 PM (mom will give 1/2 then will wait 30 minutes then other 1/2 of carton) Overnight feeds: none Total Volume: 2 cartons [name], 1 carton [employer] Standard 1.4   FWF: 160 mL with each feed x3, 60 mL after each feed and after medications x4, 120 mL diet cranberry juice @ 6 AM, 180 mL water with Miralax (1200 mL  total) Nutrition Supplement: 2 Prosource daily Previous Supplements Tried: 2kcal HN (gas/bloating)   Feed positioning/location: 15-80 degree incline PO foods: none PO beverages: none   Notes: Mom notes that Scott Bean has been on [employer] for about a year. Prior to [employer] he was on a 2 kcal HN formula for many years. Scott Bean was switched to [employer] due to gas and bloating, however this has continued with his [name]. Scott Bean was previously consuming some foods PO, however developed aspiration pneumonia and currently consumes nutrition only via gtube.  Mom notes improvement in GI symptoms since switching to new regimen. Scott Bean has only been on new regimen for ~2 weeks as it took [employer] a few weeks to send formula.  GI: daily, runny consistency, but improving - enema and Miralax daily  GU: "a lot" - very clear   Physical Activity: wheelchair bound  Estimated caloric intake: 1225 kcal/day - meets 100% of estimated needs Estimated protein intake: 0.8 g/kg/day - meets 100% of estimated needs Estimated fluid intake: 1942 mL/day - meets 162% of estimated needs  Micronutrient Intake  Vitamin A 1440 mcg  Vitamin C 150 mg  Vitamin D 21 mcg  Vitamin E 18.4 mg  Vitamin K 100 mcg  Vitamin B1 (thiamin) 1.9 mg  Vitamin B2 (riboflavin) 1.8 mg  Vitamin B3 (niacin) 21 mg  Vitamin B5 (pantothenic acid) 11.5 mg  Vitamin B6 2.1 mg  Vitamin B7 (biotin) 60 mcg  Vitamin  B9 (folate) 776 mcg  Vitamin B12 6.3 mcg  Choline 490 mg  Calcium 1050 mg  Chromium 62 mcg  Copper 2100 mcg  Fluoride 0 mg  Iodine 145 mcg  Iron 17 mg  Magnesium 368 mg  Manganese 2.4 mg  Molybdenum 120 mcg  Phosphorous 900 mg  Selenium 73.5 mcg  Zinc 19 mg  Potassium 1825 mg  Sodium 870 mg  Chloride 1199 mg  Fiber 13 g    Nutrition Diagnosis: (7/24) Inadequate oral intake related to dysphagia and NPO status as evidenced by pt dependent on Gtube feedings to meet nutritional needs. (8/31) Overweight related excess energy  intake in the setting of hypometabolic state as evidenced by BMI of 25.8.   Intervention: Discussed pt's weight trends and current regimen. Discussed recommendations below. All questions answered, family in agreement with plan.   Nutrition Recommendations: - Continue current regimen. Scott Bean's weight is looking great, we will keep a close eye on it!  Teach back method used.  Monitoring/Evaluation: Continue to Monitor: - Weight trends  - TF tolerance  - Need for Peptide formula   Follow-up in 1 month, joint with Dr. Artis Flock.  Total time spent in counseling: 20 minutes.

## 2021-12-02 DIAGNOSIS — T85733A Infection and inflammatory reaction due to implanted electronic neurostimulator of spinal cord, electrode (lead), initial encounter: Secondary | ICD-10-CM | POA: Diagnosis not present

## 2021-12-02 DIAGNOSIS — G911 Obstructive hydrocephalus: Secondary | ICD-10-CM | POA: Diagnosis not present

## 2021-12-02 DIAGNOSIS — S06360D Traumatic hemorrhage of cerebrum, unspecified, without loss of consciousness, subsequent encounter: Secondary | ICD-10-CM | POA: Diagnosis not present

## 2021-12-02 DIAGNOSIS — S06360A Traumatic hemorrhage of cerebrum, unspecified, without loss of consciousness, initial encounter: Secondary | ICD-10-CM | POA: Diagnosis not present

## 2021-12-05 ENCOUNTER — Encounter (INDEPENDENT_AMBULATORY_CARE_PROVIDER_SITE_OTHER): Payer: Self-pay | Admitting: Pediatrics

## 2021-12-05 ENCOUNTER — Ambulatory Visit (INDEPENDENT_AMBULATORY_CARE_PROVIDER_SITE_OTHER): Payer: Medicaid Other | Admitting: Dietician

## 2021-12-05 ENCOUNTER — Ambulatory Visit (INDEPENDENT_AMBULATORY_CARE_PROVIDER_SITE_OTHER): Payer: Medicaid Other | Admitting: Pediatrics

## 2021-12-05 VITALS — HR 60 | Wt 229.8 lb

## 2021-12-05 DIAGNOSIS — S069X9S Unspecified intracranial injury with loss of consciousness of unspecified duration, sequela: Secondary | ICD-10-CM

## 2021-12-05 DIAGNOSIS — E663 Overweight: Secondary | ICD-10-CM | POA: Diagnosis not present

## 2021-12-05 DIAGNOSIS — Z931 Gastrostomy status: Secondary | ICD-10-CM

## 2021-12-05 DIAGNOSIS — G40309 Generalized idiopathic epilepsy and epileptic syndromes, not intractable, without status epilepticus: Secondary | ICD-10-CM | POA: Diagnosis not present

## 2021-12-05 DIAGNOSIS — G8110 Spastic hemiplegia affecting unspecified side: Secondary | ICD-10-CM | POA: Diagnosis not present

## 2021-12-05 MED ORDER — TEGRETOL 100 MG/5ML PO SUSP: ORAL | 5 refills | Status: DC

## 2021-12-05 MED ORDER — LEVETIRACETAM 100 MG/ML PO SOLN
1500.0000 mg | Freq: Two times a day (BID) | ORAL | 5 refills | Status: DC
Start: 2021-12-05 — End: 2022-04-06

## 2021-12-05 NOTE — Progress Notes (Addendum)
Baclofen Pump Interrogation, Programming, Refill  Date of Service: 12/05/2021      Patient Name: Scott Bean                                            MRN: [id number]                                                        Date of Birth: [birth year] Primary Care Physician: Janora Norlander, DO   Provider: Carylon Perches MD MPH   HPI: Collected and documented in office note.    Indications:      Time Out: Done immediately prior to procedure   Description:    The patient was placed in the supine position. The pump was interrogated and found to have a calculated residual volume of: 1.3 ml. There was no redness or edema noted at the pump site directly, however chronic rash location on abdomen. Prior to procedure, location of pump was estimated 5.3cm from medial line of scar and 7 mm down. The pump reservoir site was prepped with betadine and draped using sterile technique per protocol. The pump was accessed on the  first attempt using the 22 gauge needle provided in the refill kit. The residual baclofen was withdrawn and measured to be: 6 ml. The pump reservoir was refilled with 32m of baclofen 5019m/ml over 2 minute using sterile technique, deaccessed and the skin cleaned. See below for program details.    LOT no. for baclofen: 2144-117 Expiration date: May 2025     StCarylon PerchesD MPH [facility] Neurology, Neurodevelopment and [facility] [id number]: [phone]
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
## 2021-12-05 NOTE — Patient Instructions (Signed)
Nutrition Recommendations: - Continue current regimen. [name]'s weight is looking great, we will keep a close eye on it!

## 2021-12-13 ENCOUNTER — Telehealth (INDEPENDENT_AMBULATORY_CARE_PROVIDER_SITE_OTHER): Payer: Self-pay

## 2021-12-13 NOTE — Telephone Encounter (Signed)
Contacted a representative by the name of Janine Limbo at [facility].    Verified pt's name and DOB.   Janine Limbo stated that she could see the referral but, on her end it looks as though we've sent the referral to ourselves. She states that this may be an error. The [facility] does not see adult patients.  She was able to give contacts for different locations.    [facility]  [phone]  ------------------------------------------------  I spoke with a representative by the name of Melbourne Abts at the [facility].  She confirmed that their office does see Adult patients.  She asked for the pt's age and Diagnosis and stated that he could be seen there.  Referral can be sent to [facility]  [phone]    SS, CCMA

## 2021-12-14 ENCOUNTER — Encounter (INDEPENDENT_AMBULATORY_CARE_PROVIDER_SITE_OTHER): Payer: Self-pay | Admitting: Pediatrics

## 2021-12-16 ENCOUNTER — Other Ambulatory Visit: Payer: Self-pay | Admitting: Pediatrics

## 2021-12-16 NOTE — Telephone Encounter (Signed)
Referral adjusted as recommended for [facility] neuro rehabilitation, under the rehabilitation department.   Lorenz Coaster MD MPH

## 2021-12-16 NOTE — Progress Notes (Signed)
Speech the

## 2021-12-17 ENCOUNTER — Encounter: Payer: Medicaid Other | Admitting: Speech Pathology

## 2021-12-24 ENCOUNTER — Encounter: Payer: Self-pay | Admitting: Speech Pathology

## 2021-12-24 ENCOUNTER — Ambulatory Visit: Payer: Medicaid Other | Attending: Pediatrics | Admitting: Speech Pathology

## 2021-12-24 DIAGNOSIS — R4701 Aphasia: Secondary | ICD-10-CM | POA: Insufficient documentation

## 2021-12-24 DIAGNOSIS — F801 Expressive language disorder: Secondary | ICD-10-CM | POA: Diagnosis not present

## 2021-12-24 DIAGNOSIS — F802 Mixed receptive-expressive language disorder: Secondary | ICD-10-CM | POA: Insufficient documentation

## 2021-12-24 DIAGNOSIS — R41841 Cognitive communication deficit: Secondary | ICD-10-CM | POA: Insufficient documentation
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
## 2021-12-25 ENCOUNTER — Other Ambulatory Visit: Payer: Self-pay

## 2021-12-25 IMAGING — DX DG CHEST 1V
1 series · 1 of 1 positions shown · non-contrast
Comparison: Chest x-ray dated June 30, 2020.

CLINICAL DATA: Evaluate for pneumonia.

EXAM:
CHEST  1 VIEW

[chest ap]
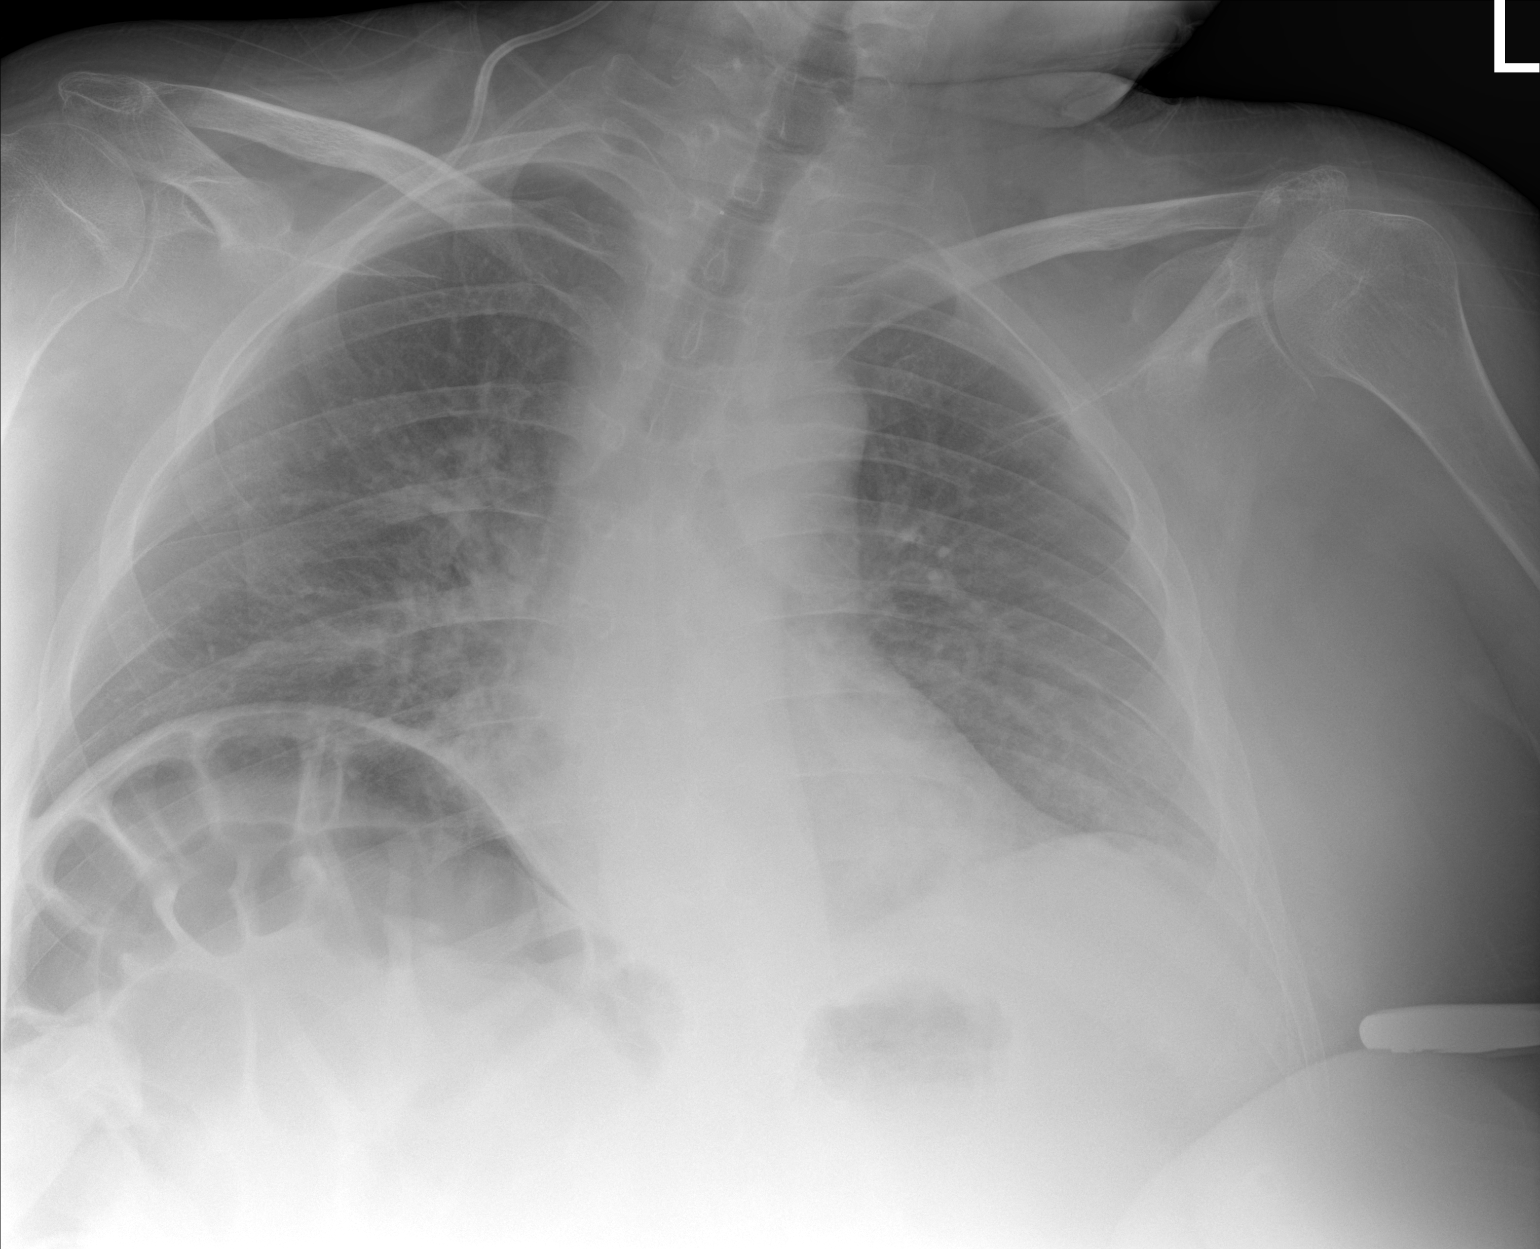

[1 of 1 positions shown; findings below may reference images not displayed]

FINDINGS: The heart size and mediastinal contours are within normal limits.
Low lung volumes. Increased right perihilar and infrahilar density.
No pleural effusion or pneumothorax. No acute osseous abnormality.
Unchanged VP shunt catheter overlying the right chest wall.
IMPRESSION: 1. Increased right perihilar and infrahilar density, concerning for
pneumonia.

## 2021-12-31 DIAGNOSIS — G911 Obstructive hydrocephalus: Secondary | ICD-10-CM | POA: Diagnosis not present

## 2021-12-31 DIAGNOSIS — T85733A Infection and inflammatory reaction due to implanted electronic neurostimulator of spinal cord, electrode (lead), initial encounter: Secondary | ICD-10-CM | POA: Diagnosis not present

## 2021-12-31 DIAGNOSIS — S06360A Traumatic hemorrhage of cerebrum, unspecified, without loss of consciousness, initial encounter: Secondary | ICD-10-CM | POA: Diagnosis not present

## 2021-12-31 DIAGNOSIS — S06360D Traumatic hemorrhage of cerebrum, unspecified, without loss of consciousness, subsequent encounter: Secondary | ICD-10-CM | POA: Diagnosis not present

## 2022-01-09 ENCOUNTER — Ambulatory Visit: Payer: Medicaid Other | Admitting: Speech Pathology

## 2022-01-10 NOTE — Progress Notes (Signed)
This is a Pediatric Specialist E-Visit follow up consult provided via MyChart Video Visit. [name] and their parent/guardian consented to an E-Visit consult today.  Location of patient: Augusten is at home.  Location of provider: Milana Obey, RD is at [facility]).  This visit was done via VIDEO   Medical Nutrition Therapy - Progress Note Appt start time: 1:18 PM  Appt end time: 1:28 PM Reason for referral: Gtube dependence Referring provider: Dr. Artis Flock - Neuro Pertinent medical hx: Spastic hemiplegia, epilepsy, obstructive hydrocephalus, TBI with loss of consciousness, cognitive decline, h/o gastrointestinal disease, transient alteration of awareness, urinary and fecal incontinence, high risk for pressure injury of skin, +gtube  Assessment: Food allergies: none Pertinent Medications: see medication list Vitamins/Supplements: none Pertinent labs:  (5/31) TSH, Free T4, CBC - WNL  (5/31) Lipid Panel: Total Cholesterol - 217 (high), TG - 245 (high), HDL - 59 (WNL), VLDL - 42 (high), LDL - 116 (high)  No anthropometrics taken on 10/12 due to virtual appointment. Most recent anthropometrics 10/9 were used to determine dietary needs.   (10/9) Anthropometrics: Ht: 200 cm    Wt: 102 kg (226 # 6.4 oz)   BMI: 25.5  IBW based on Hamwi Equation: 100 kg (+/- 10%)  (8/31) Anthropometrics: Ht: 200 cm    Wt: 104.2 kg (229 # 12.8 oz)   BMI: 25.8  IBW based on Hamwi Equation: 100 kg (+/- 10%)  8/31 Wt: 104.2 kg (229 # 12.8 oz) 7/20 Wt: 105.4 kg (231 # 9.6 oz)  UBW: ~200 #    Estimated minimum caloric needs: 1225 kcal/day (10% decrease from baseline given weight gain with current regimen)  Estimated minimum protein needs: 0.8 g/kg/day (DRI) Estimated minimum fluid needs: 1225 mL/day (1 mL/kcal)   10/24/21 Wt: 231 # 9.6 oz (105.4 kg) 09/02/19 Wt: 194 # (88.2 kg)  Primary concerns today: Follow-up given pt with gtube dependence. Mom accompanied pt to appt  virtual today.   Dietary Intake Hx: DME: [employer]   Formula: [name] Standard 1.4, [employer] Standard 1.0 Current regimen:  Day feeds: 325 mL (1 carton) via gravity feeds x 3 feeds  @ 8 AM, 1 PM, 6 PM (mom will give 1/2 then will wait 30 minutes then other 1/2 of carton) Overnight feeds: none Total Volume: 2 cartons [name], 1 carton [employer] Standard 1.4   FWF: 160 mL with each feed x3, 60 mL after each feed and after medications x4, 120 mL diet cranberry juice @ 6 AM, 180 mL water with Miralax (1200 mL total) Nutrition Supplement: 2 Prosource daily Previous Supplements Tried: 2kcal HN (gas/bloating)   Feed positioning/location: 15-80 degree incline PO foods: none PO beverages: none   Notes: Mom notes that [name] has been on [employer] for about a year. Prior to [employer] he was on a 2 kcal HN formula for many years. [name] was switched to [employer] due to gas and bloating, however this has continued with his [name]. [name] was previously consuming some foods PO, however developed aspiration pneumonia and currently consumes nutrition only via gtube.  Mom notes continued improvement in GI symptoms since switching to new regimen, noting less overall bloating.  GI: daily, runny consistency, but improving and thickening up - enema and Miralax daily  GU: "a lot" - very clear   Physical Activity: wheelchair bound  Estimated caloric intake: 1125 kcal/day - meets 100% of estimated needs Estimated protein intake: 0.8 g/kg/day -  meets 100% of estimated needs Estimated fluid intake: 1942 mL/day - meets 159% of estimated needs  Micronutrient Intake  Vitamin A 1440 mcg  Vitamin C 150 mg  Vitamin D 21 mcg  Vitamin E 18.4 mg  Vitamin K 100 mcg  Vitamin B1 (thiamin) 1.9 mg  Vitamin B2 (riboflavin) 1.8 mg  Vitamin B3 (niacin) 21 mg  Vitamin B5 (pantothenic acid) 11.5 mg  Vitamin B6 2.1 mg  Vitamin B7 (biotin) 60 mcg  Vitamin B9 (folate) 776 mcg  Vitamin B12 6.3 mcg   Choline 490 mg  Calcium 1050 mg  Chromium 62 mcg  Copper 2100 mcg  Fluoride 0 mg  Iodine 145 mcg  Iron 17 mg  Magnesium 368 mg  Manganese 2.4 mg  Molybdenum 120 mcg  Phosphorous 900 mg  Selenium 73.5 mcg  Zinc 19 mg  Potassium 1825 mg  Sodium 870 mg  Chloride 1199 mg  Fiber 13 g    Nutrition Diagnosis: (7/24) Inadequate oral intake related to dysphagia and NPO status as evidenced by pt dependent on Gtube feedings to meet nutritional needs. (10/12) Overweight related excess energy intake in the setting of hypometabolic state as evidenced by BMI of 25.5.   Intervention: Discussed pt's weight trends and current regimen in detail. RD discussed with mom rate of weight loss was very appropriate for [name] at this time. RD and mom discussed IBW and continued close monitoring to ensure that [name] is not losing weight too quickly. Discussed recommendations below. All questions answered, family in agreement with plan.   Nutrition Recommendations: - Continue current regimen. [name] is looking great!  Teach back method used.  Monitoring/Evaluation: Continue to Monitor: - Weight trends  - TF tolerance  - Need for Peptide formula - Ability to decrease free water   Follow-up in 6 weeks, joint with Dr. Rogers Blocker  Total time spent in counseling: 10 minutes.

## 2022-01-13 ENCOUNTER — Encounter (INDEPENDENT_AMBULATORY_CARE_PROVIDER_SITE_OTHER): Payer: Self-pay | Admitting: Pediatrics

## 2022-01-13 ENCOUNTER — Ambulatory Visit (INDEPENDENT_AMBULATORY_CARE_PROVIDER_SITE_OTHER): Payer: Medicaid Other | Admitting: Pediatrics

## 2022-01-13 ENCOUNTER — Encounter: Payer: Medicaid Other | Admitting: Speech Pathology

## 2022-01-13 VITALS — Wt 226.4 lb

## 2022-01-13 DIAGNOSIS — G8110 Spastic hemiplegia affecting unspecified side: Secondary | ICD-10-CM | POA: Diagnosis not present

## 2022-01-15 ENCOUNTER — Encounter: Payer: Medicaid Other | Admitting: Speech Pathology

## 2022-01-16 ENCOUNTER — Ambulatory Visit (INDEPENDENT_AMBULATORY_CARE_PROVIDER_SITE_OTHER): Payer: Medicaid Other | Admitting: Dietician

## 2022-01-16 DIAGNOSIS — Z931 Gastrostomy status: Secondary | ICD-10-CM

## 2022-01-16 DIAGNOSIS — E663 Overweight: Secondary | ICD-10-CM

## 2022-01-16 DIAGNOSIS — Z87820 Personal history of traumatic brain injury: Secondary | ICD-10-CM | POA: Diagnosis not present

## 2022-01-16 NOTE — Patient Instructions (Signed)
Nutrition Recommendations: - Continue current regimen. [name] is looking great!

## 2022-01-20 ENCOUNTER — Encounter: Payer: Medicaid Other | Admitting: Speech Pathology

## 2022-01-24 ENCOUNTER — Encounter: Payer: Medicaid Other | Admitting: Speech Pathology

## 2022-01-27 ENCOUNTER — Encounter: Payer: Medicaid Other | Admitting: Speech Pathology

## 2022-01-27 ENCOUNTER — Telehealth (INDEPENDENT_AMBULATORY_CARE_PROVIDER_SITE_OTHER): Payer: Self-pay | Admitting: Dietician

## 2022-01-27 NOTE — Telephone Encounter (Signed)
  Name of who is calling: Scott Bean Relationship to Patient: mom  Best contact number: [phone]  Provider they see: [name]  Reason for call: [name] is calling in regards to his diet 1:1.4 and 2:2.0 a day and they are are out of the 1.0. what can be used in replacement of that? And whatever it is has to have a prescription and he's almost out of his food so she needs it asap. Mom isn't sure if you want to call over there and see what they have. She's asking can you please let her know what you get figured out.      PRESCRIPTION REFILL ONLY  Name of prescription:   Pharmacy: [facility]

## 2022-01-28 ENCOUNTER — Encounter (INDEPENDENT_AMBULATORY_CARE_PROVIDER_SITE_OTHER): Payer: Self-pay

## 2022-01-28 ENCOUNTER — Ambulatory Visit: Payer: Medicaid Other | Attending: Pediatrics | Admitting: Speech Pathology

## 2022-01-28 DIAGNOSIS — F802 Mixed receptive-expressive language disorder: Secondary | ICD-10-CM | POA: Insufficient documentation

## 2022-01-28 DIAGNOSIS — G911 Obstructive hydrocephalus: Secondary | ICD-10-CM | POA: Diagnosis not present

## 2022-01-28 DIAGNOSIS — R4701 Aphasia: Secondary | ICD-10-CM | POA: Insufficient documentation

## 2022-01-28 DIAGNOSIS — S06360D Traumatic hemorrhage of cerebrum, unspecified, without loss of consciousness, subsequent encounter: Secondary | ICD-10-CM | POA: Diagnosis not present

## 2022-01-28 DIAGNOSIS — R41841 Cognitive communication deficit: Secondary | ICD-10-CM | POA: Insufficient documentation

## 2022-01-28 DIAGNOSIS — F801 Expressive language disorder: Secondary | ICD-10-CM | POA: Diagnosis present

## 2022-01-28 DIAGNOSIS — S06360A Traumatic hemorrhage of cerebrum, unspecified, without loss of consciousness, initial encounter: Secondary | ICD-10-CM | POA: Diagnosis not present

## 2022-01-28 DIAGNOSIS — T85733A Infection and inflammatory reaction due to implanted electronic neurostimulator of spinal cord, electrode (lead), initial encounter: Secondary | ICD-10-CM | POA: Diagnosis not present

## 2022-01-28 NOTE — Therapy (Signed)
OUTPATIENT SPEECH LANGUAGE PATHOLOGY EVALUATION   Patient Name: Scott Bean MRN: [id number] DOB:Sep 03, 1990, 31 y.o., male Today's Date: 01/28/2022  PCP: Janora Norlander, DO REFERRING PROVIDER: Rocky Link, MD   End of Session - 01/28/22 1147     Visit Number 2    Number of Visits 25    Date for SLP Re-Evaluation 03/19/22    Authorization Type Medicaid [facility]    SLP Start Time 1147    SLP Stop Time  66    SLP Time Calculation (min) 43 min    Activity Tolerance Patient tolerated treatment well             Past Medical History:  Diagnosis Date   Bacteremia 12/10/2011   Cellulitis 12/27/2012   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, implant, and graft 02/06/2013   PNA (pneumonia) 12/09/2011   Traumatic brain injury [facility])    Past Surgical History:  Procedure Laterality Date   decompressive craniotomy     implanation of intrathecal baclofen pump     PEG TUBE PLACEMENT     reimplantation of intrathecal baclofen pump     VENTRICULOPERITONEAL SHUNT     Patient Active Problem List   Diagnosis Date Noted   S/P percutaneous endoscopic gastrostomy (PEG) tube placement ([place]) 09/04/2021   Full incontinence of feces 09/04/2021   Urinary incontinence without sensory awareness 09/04/2021   At high risk for pressure injury of skin 09/04/2021   Irritation around percutaneous endoscopic gastrostomy (PEG) tube site ([place]) 08/30/2020   Upper respiratory infection with cough and congestion 08/13/2020   Transient alteration of awareness 03/18/2019   Spastic hemiplegia affecting nondominant side (HCC) 08/10/2012   Spastic hemiplegia affecting dominant side ([place]) 08/10/2012   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   Encounter for long-term (current) use of other medications 08/10/2012   H/O gastrointestinal disease 05/15/2011   Allergic state 05/15/2011   Obstructive hydrocephalus ([place]) 05/14/2011   Closed skull fracture with  intracranial injury, with loss of consciousness ([place]) 05/14/2011   Cognitive decline 05/14/2011   Hydrocephalus ([place]) 05/14/2011   Traumatic brain injury with loss of consciousness ([place]) 05/14/2011    ONSET DATE: Referral date 10/28/2021  REFERRING DIAG: S06.9X9S (ICD-10-CM) - Traumatic brain injury with loss of consciousness, sequela ([place])  THERAPY DIAG:  Cognitive communication deficit  Expressive language disorder  Nonverbal  Receptive language disorder  Rationale for Evaluation and Treatment Habilitation  SUBJECTIVE:   SUBJECTIVE STATEMENT: Pt attends session with mother and father. Rep from [place] also present to assist with eye gaze device assessment.   PAIN:  Are you having pain?  No s/sx of pain evidenced  OBJECTIVE:   TODAY'S TREATMENT:  01-28-22: SLP presented pt with Tobii Dynavox device equipped with eye gaze technology to assess candidacy for usage. Use of various programs to determine if pt is able to track objects on screen and if device is able to detect pt's eye movement. Pt's mom report pt unseeing out of L eye. Pt's eye gaze detected approximately 10% of trials with max-A for optimizing positioning, use of visual, verbal, tactile cues. SLP provided education to mother on facilitating choice making at home to provide practice for making choices and use of intentional communication. Mother verbalizes understanding. Request device for trial with wheel chair mount accessory.   PATIENT EDUCATION: Education details: see above Person educated: Patient and Parents Education method: Customer service manager Education comprehension: verbalized understanding, returned demonstration, verbal cues required, and needs further education  GOALS: Goals reviewed with patient? Yes  SHORT TERM GOALS: Target date: 02/14/2022 (STG date changed d/t scheduling)   Pt will orient head to dictated icon from F:2 with 50% accuracy across 2 sessions Baseline: orients head  to speaker, demonstrating head turn ability Goal status: INITIAL  2.  Pt will establish reliable y/n response with 50% accuracy across 2 sessions Baseline: per caregiver report, intermittent Y/N through eye blinks, no reliable Y/N during eval Goal status: INITIAL  3.  Pt's mother will demonstrate use of binary choice strategy to aid in successful communication of potential wants/needs with mod-I over 2 sessions Baseline: phonation for response Goal status: INITIAL   LONG TERM GOALS: Target date: 03/19/2022  Pt will orient head to dictated icon from F:2 with 70% accuracy across 2 sessions Baseline: orients head to speaker, demonstrating head turn ability Goal status: INITIAL  2.  Pt will indicate wants/needs/preference/fact using Y/N with 70% accuracy across 2 sessions Baseline: per caregiver report, intermittent Y/N through eye blinks, no reliable Y/N during eval Goal status: INITIAL  3.  Pt's parents will report implementation of binary choice strategy at home enabling pt expression of needs or preference over 1 week period Baseline: phonation for response Goal status: INITIAL  ASSESSMENT:  CLINICAL IMPRESSION: Patient is a 31 y.o. M who was seen today for language evaluation. Pt attends session with both parents. Report extremely limited ability to express wants and needs beyond behavioral presentation. For example, will get "fussy" per mom when hungry. Other needs not being met d/t limited communication (example given of having blanket on, then appearing hot later but not attempting to express discomfort). Expresses joy through facial expressions and phonation attempts. Evidenced this date when preferred song was played. Additional phonation evidenced, suspected expression of dissatisfaction, when turned off. Occasional success with Y/N using eye blinks reported per parents, is unable to demonstrate skill this date. Aside from occasional phonation attempts, pt is non-verbal. Pt is  unable to following any commands for oral mechanism exam either d/t difficulty following instructions or likely impaired motor control of oral structures. Demonstrates ability to turn head and awareness of who is talking, by orienting head towards speaking parents majority of opportunities this date. Sustained attention suspected throughout evaluation d/t pt's frequent orientation to speaker. Pt's communication deficits persisting since 2006, indicating improvement in communication abilities is unlikely without use of augmentative communication device. Further complication is pt's spasticity, impairing ability to use hands to manipulate low tech, more accessible communication board options. Pt's profound communication impairment is not currently meeting his communication needs. Per parent's report, pt successfully used eye gaze device historically, when in school. Unclear what happened to this device (stopped working vs school device?) Pt would benefit from ST to improve expressive communication of wants and needs, to include trial of speech generating device (SGD) to assess candidacy and appropriateness of potential device. SGD would need to be equipped with eye gaze technology and wheelchair mount to facilitate accessibility.     OBJECTIVE IMPAIRMENTS include expressive language and receptive language. These impairments are limiting patient from effectively communicating at home and in community. Factors affecting potential to achieve goals and functional outcome are ability to learn/carryover information, co-morbidities, previous level of function, severity of impairments, and financial resources. Patient will benefit from skilled SLP services to address above impairments and improve overall function.  REHAB POTENTIAL: Fair    PLAN: SLP FREQUENCY: 2x/week  SLP DURATION: 12 weeks  PLANNED INTERVENTIONS: Language facilitation, Multimodal communication approach, SLP instruction and  feedback, and  Patient/family education  Managed medicaid CPT codes: [phone] - SLP treatment and Other [phone] - AAC evaluation, [phone] - AAC treatment   Su Monks, [place] 01/28/2022, 12:27 PM

## 2022-01-28 NOTE — Telephone Encounter (Signed)
RD called mom to discuss formula issue. Mom told RD that [facility] was out of [facility] Standard 1.0 and would not be able to send anything out until they received a new prescription for formula substitution. RD discussed with mom alternatives such as Nutren 1.0, Jevity 1.0 or Compleat Standard 1.0. Mom in agreement with all alternatives. RD will put in new order and will get [employer] reps to send samples. RD provided samples at front desk. Mom in agreement with plan.

## 2022-01-28 NOTE — Telephone Encounter (Signed)
Scott Bean was calling back to follow up if Scott Bean has received her message. She stated that they got to get some food ordered. She has requested a call back.

## 2022-01-28 NOTE — Progress Notes (Signed)
   Baclofen Pump Interrogation, Programming, Refill  Date of Service: 01/28/2022     Patient Name: Scott Bean      MRN: [id number]      Date of Birth: 04-06-1991 Primary Care Physician: Janora Norlander, DO  Provider: Carylon Perches MD MPH   HPI: Mother reports no concerns since last appointment.    Indications:      Time Out::  Done immediately prior to procedure  Description:  The patient was placed in the supine position. The pump was interrogated and found to have a calculated residual volume of: 5 ml. There was  no redness or edema noted at the pump site. The pump reservoir site was prepped with betadine and draped using sterile technique per protocol. The pump was accessed on the  first attempt using the 22 gauge needle provided in the refill kit. The residual baclofen was withdrawn and measured to be: 8 ml. The pump reservoir was refilled with 29m of baclofen 5090m/ml over 2 minute using sterile technique, deaccessed and the skin cleaned.  See report below for pump details.    LOT no. for baclofen: [id number] Expiration date:07/2024  Patient Report Scott Bean   Application Version: [id number] Date: Jan 13, 2022 9:09 AM   PATIENT AND SESSION INFORMATION Last Name : [name] 1/17-Jun-1992First Name : [name] : Synchro[employer]T II; [id number]  Patient ID : - - - - - Last Update : 01/13/22 9:21 AM  SCHEDULE APPOINTMENT BEFORE 02/24/22 NEXT REFILL APPOINTMENT 02/24/22 9:30 PM CHANGES MADE Refill & Adjust session workflow. 1 updates occurred. Changes made in:    - Reservoir volume RESterlingolume 40.0 mL  DRUGS Title Current Settings  Primary: Baclofen (500.0 mcg/mL)  Is Patient Receiving Systemic Medication? - - - - - INFUSION Monday - Sunday : Simple Continuous  Step Duration Baclofen  Step 1: 12:00 AM - 12:00 AM 24 hr 463.06 mcg (19.29 mcg/hr)  24 Hour Dose: 463.06 mcg/day (0.0%)   END OF REPORT Feb 10, 2022 1:49 AM [place] [facility] [facility] [facility]   PLAN: Return day for next refill scheduled Confirmed patient has refills, no need for refills before next appointment.   StCarylon PerchesD MPH [facility]ered[facility] Neurology, Neurodevelopment and NeAurora [facility]11[place] [id number]: [phone]

## 2022-01-28 NOTE — Telephone Encounter (Signed)
RD talked with [facility] who said [employer] Standard 1.0 should only be on back order for about a month. Therefore RD and [facility] dietitian decided best plan would be 2.5 cartons of [facility] Standard 1.4 given so new order would not need to be sent in and processed at this time for a short back order. RD discussed with mom and new plan for:   1 carton [employer] 1.4 given in AM  1/2 carton [employer] 1.4 given at lunch  1 carton [employer] 1.4 given in PM

## 2022-01-29 ENCOUNTER — Ambulatory Visit: Payer: Medicaid Other | Admitting: Physical Medicine & Rehabilitation

## 2022-01-29 DIAGNOSIS — G911 Obstructive hydrocephalus: Secondary | ICD-10-CM | POA: Diagnosis not present

## 2022-01-29 DIAGNOSIS — S06360A Traumatic hemorrhage of cerebrum, unspecified, without loss of consciousness, initial encounter: Secondary | ICD-10-CM | POA: Diagnosis not present

## 2022-01-29 DIAGNOSIS — S06360D Traumatic hemorrhage of cerebrum, unspecified, without loss of consciousness, subsequent encounter: Secondary | ICD-10-CM | POA: Diagnosis not present

## 2022-01-29 DIAGNOSIS — T85733A Infection and inflammatory reaction due to implanted electronic neurostimulator of spinal cord, electrode (lead), initial encounter: Secondary | ICD-10-CM | POA: Diagnosis not present

## 2022-01-30 ENCOUNTER — Ambulatory Visit: Payer: Medicaid Other | Admitting: Speech Pathology

## 2022-02-03 ENCOUNTER — Ambulatory Visit: Payer: Medicaid Other | Admitting: Speech Pathology

## 2022-02-06 ENCOUNTER — Encounter: Payer: Medicaid Other | Admitting: Speech Pathology

## 2022-02-10 ENCOUNTER — Ambulatory Visit: Payer: Medicaid Other | Admitting: Speech Pathology

## 2022-02-10 ENCOUNTER — Encounter (INDEPENDENT_AMBULATORY_CARE_PROVIDER_SITE_OTHER): Payer: Self-pay | Admitting: Pediatrics

## 2022-02-10 NOTE — Progress Notes (Signed)
This is a Pediatric Specialist E-Visit follow up consult provided via MyChart Video Visit. Scott Bean and their parent/guardian consented to an E-Visit consult today.  Location of patient: Scott Bean is in car.  Location of provider: Milana Obey, RD is at [facility] [place]).  This visit was done via VIDEO   Medical Nutrition Therapy - Progress Note Appt start time: 10:20 AM  Appt end time: 10:35 AM  Reason for referral: Gtube dependence Referring provider: Dr. Artis Flock - Neuro Pertinent medical hx: Spastic hemiplegia, epilepsy, obstructive hydrocephalus, TBI with loss of consciousness, cognitive decline, h/o gastrointestinal disease, transient alteration of awareness, urinary and fecal incontinence, high risk for pressure injury of skin, +gtube  Assessment: Food allergies: none Pertinent Medications: see medication list Vitamins/Supplements: none Pertinent labs:  (5/31) TSH, Free T4, CBC - WNL  (5/31) Lipid Panel: Total Cholesterol - 217 (high), TG - 245 (high), HDL - 59 (WNL), VLDL - 42 (high), LDL - 116 (high)  (11/20) Anthropometrics: Ht: 200 cm   Wt: 103.6 kg *mom reports weight was 226#* BMI: 25.6   IBW based on Hamwi Equation: 100 kg (+/- 10%)  (10/9) Anthropometrics: Ht: 200 cm    Wt: 102 kg (226 # 6.4 oz)   BMI: 25.5  IBW based on Hamwi Equation: 100 kg (+/- 10%)  8/31 Wt: 104.2 kg (229 # 12.8 oz) 7/20 Wt: 105.4 kg (231 # 9.6 oz)  UBW: ~200 #    Estimated minimum caloric needs: 1225 kcal/day (10% decrease from baseline given weight gain with current regimen)  Estimated minimum protein needs: 0.8 g/kg/day (DRI) Estimated minimum fluid needs: 1225 mL/day (1 mL/kcal)   Primary concerns today: Follow-up given pt with gtube dependence. Mom accompanied pt to appt virtual today.   Dietary Intake Hx: DME: [facility]   Formula: [name] Standard 1.4 Current regimen:  Day feeds: 325 mL (1 carton) via gravity feeds x 2 feeds  @ 8 AM, 6 PM (mom  will give 1/2 then will wait 30 minutes then other 1/2 of carton), 163 mL (1/2 carton) via gravity feed x 1 feed @ 1 PM Overnight feeds: none Total Volume: 2.5 cartons [name] Standard 1.4  FWF: 160 mL with each feed x3, 60 mL after each feed and after medications x4, 120 mL diet cranberry juice @ 6 AM, 180 mL water with Miralax (1200 mL total) Nutrition Supplement: 2 Prosource daily  Previous Supplements Tried: 2kcal HN (gas/bloating)   Feed positioning/location: 15-80 degree incline PO foods: none PO beverages: none   Notes: Mom notes that Scott Bean has been on [employer] for about a year. Prior to [employer] he was on a 2 kcal HN formula for many years. Scott Bean was switched to [employer] due to gas and bloating, mom notes improvement with tolerance given slow weight loss. Scott Bean was previously consuming some foods PO, however developed aspiration pneumonia and currently consumes nutrition only via gtube.   GI: daily, runny consistency, but improving and thickening up - enema and Miralax daily  GU: "a lot" - very clear   Physical Activity: wheelchair bound  Estimated caloric intake: 1257 kcal/day - meets 103% of estimated needs Estimated protein intake: 0.8 g/kg/day - meets 100% of estimated needs Estimated fluid intake: 1785 mL/day - meets 146% of estimated needs  Micronutrient Intake  Vitamin A 1200 mcg  Vitamin C 125 mg  Vitamin D 17.5 mcg  Vitamin E 22.5 mg  Vitamin K 100 mcg  Vitamin B1 (thiamin) 1.3 mg  Vitamin B2 (riboflavin) 1.5 mg  Vitamin B3 (niacin) 17.5 mg  Vitamin B5 (pantothenic acid) 8.8 mg  Vitamin B6 1.8 mg  Vitamin B7 (biotin) 50 mcg  Vitamin B9 (folate) 750 mcg  Vitamin B12 5.3 mcg  Choline 450 mg  Calcium 875 mg  Chromium 75 mcg  Copper 1750 mcg  Fluoride 0 mg  Iodine 162.5 mcg  Iron 17.5 mg  Magnesium 350 mg  Manganese 2 mg  Molybdenum 125 mcg  Phosphorous 750 mg  Selenium 61.3 mcg  Zinc 15 mg  Potassium 1812.5 mg  Sodium 875 mg  Chloride 1312.5 mg   Fiber 3 g    Nutrition Diagnosis: (7/24) Inadequate oral intake related to dysphagia and NPO status as evidenced by pt dependent on Gtube feedings to meet nutritional needs. (10/12) Overweight related excess energy intake in the setting of hypometabolic state as evidenced by BMI of 25.5.   Intervention: Discussed pt's weight trends and current regimen in detail. Discussed recommendations below. All questions answered, family in agreement with plan.   Nutrition Recommendations sent via MyChart message: - Let's start an adult multivitamin to ensure we're meeting micronutrient goals on this current regimen. I recommend Centrum Adult Liquid Multivitamin.   Teach back method used.  Monitoring/Evaluation: Continue to Monitor: - Weight trends  - TF tolerance  - Need for Peptide formula - Ability to decrease free water   Follow-up in 2 months.  Total time spent in counseling: 15 minutes.

## 2022-02-12 ENCOUNTER — Ambulatory Visit: Payer: Medicaid Other | Admitting: Speech Pathology

## 2022-02-15 IMAGING — DX DG CHEST 1V
1 series · 1 of 1 positions shown · non-contrast
Comparison: 08/29/2020 chest radiograph.

CLINICAL DATA: Cough and congestion

EXAM:
CHEST  1 VIEW

[chest ap]
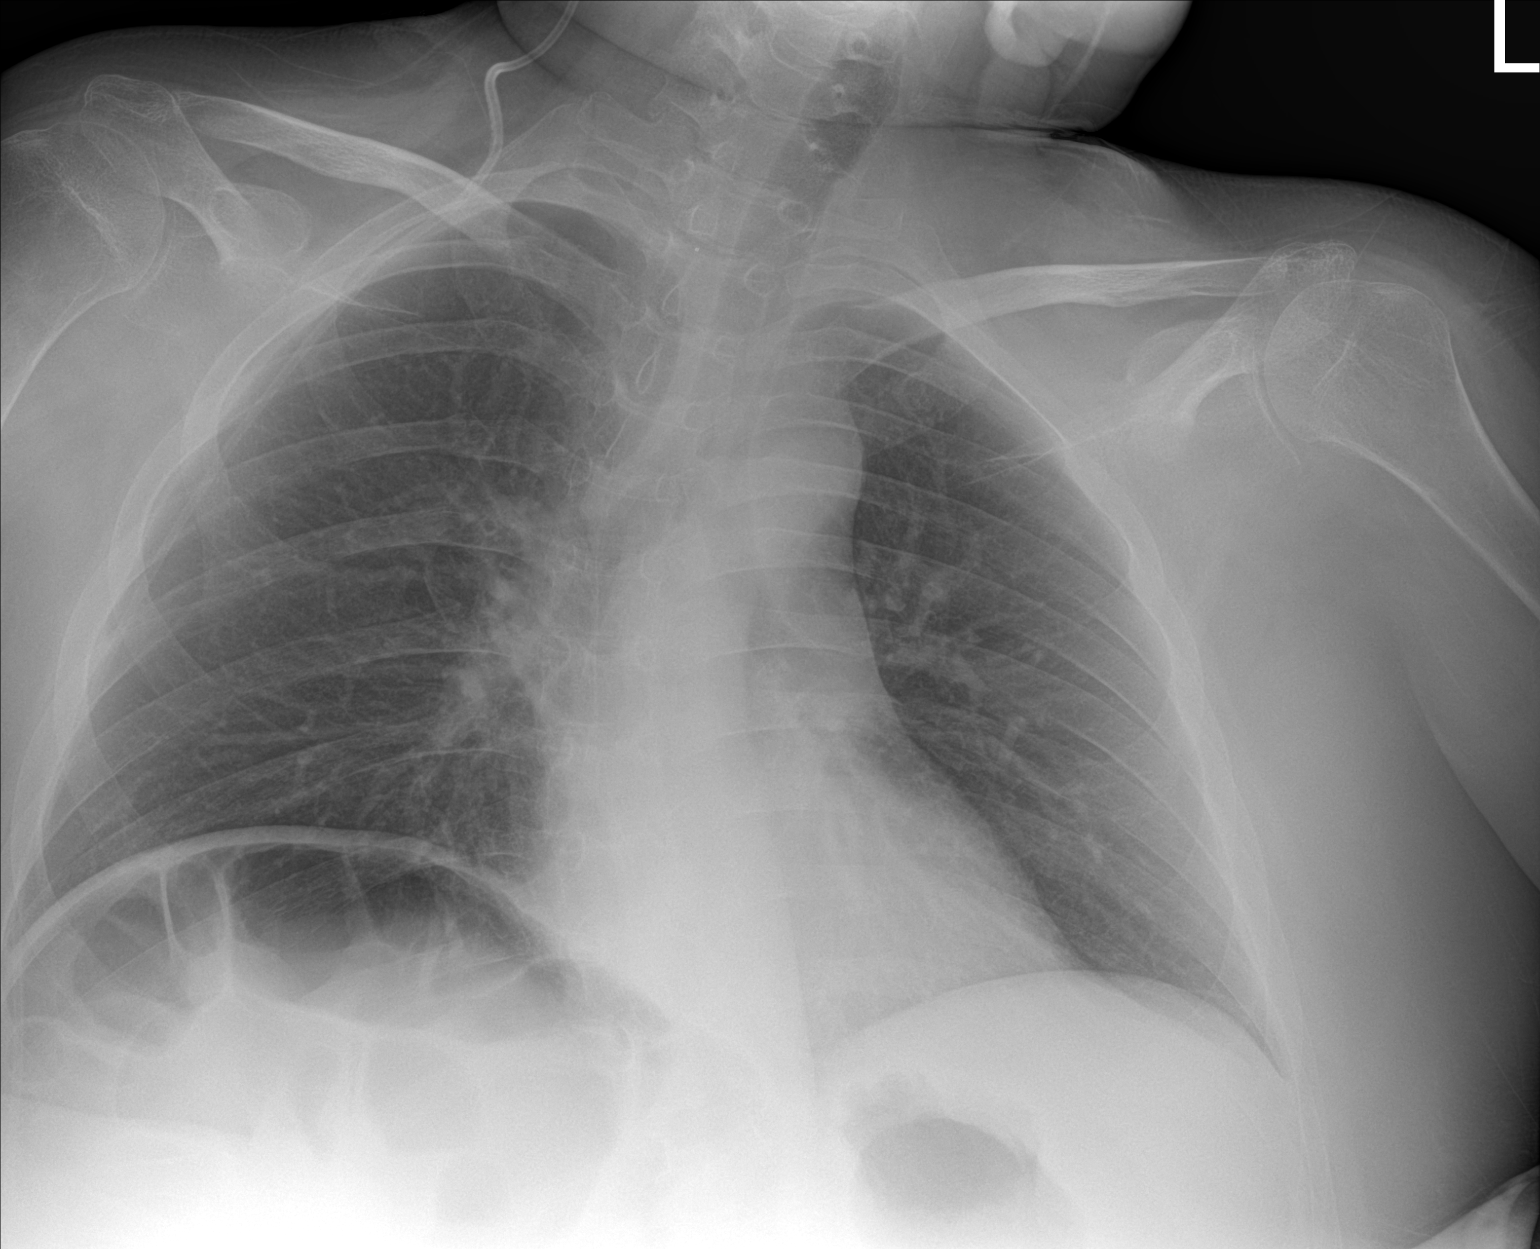

[1 of 1 positions shown; findings below may reference images not displayed]

FINDINGS: Intact appearing right sided VP shunt catheter. Stable
cardiomediastinal silhouette with normal heart size. No
pneumothorax. No pleural effusion. No pulmonary edema. No acute
consolidative airspace disease. Chronic mild linear scarring at the
medial right lung base.
IMPRESSION: No active cardiopulmonary disease.

## 2022-02-18 ENCOUNTER — Ambulatory Visit: Payer: Medicaid Other | Admitting: Speech Pathology

## 2022-02-19 ENCOUNTER — Ambulatory Visit: Payer: Medicaid Other | Admitting: Physical Medicine & Rehabilitation

## 2022-02-20 ENCOUNTER — Ambulatory Visit: Payer: Medicaid Other

## 2022-02-24 ENCOUNTER — Encounter (INDEPENDENT_AMBULATORY_CARE_PROVIDER_SITE_OTHER): Payer: Self-pay

## 2022-02-24 ENCOUNTER — Ambulatory Visit (INDEPENDENT_AMBULATORY_CARE_PROVIDER_SITE_OTHER): Payer: Medicaid Other | Admitting: Dietician

## 2022-02-24 ENCOUNTER — Ambulatory Visit (INDEPENDENT_AMBULATORY_CARE_PROVIDER_SITE_OTHER): Payer: Medicaid Other | Admitting: Pediatrics

## 2022-02-24 VITALS — Wt 228.0 lb

## 2022-02-24 DIAGNOSIS — Z931 Gastrostomy status: Secondary | ICD-10-CM

## 2022-02-24 DIAGNOSIS — G8110 Spastic hemiplegia affecting unspecified side: Secondary | ICD-10-CM | POA: Diagnosis not present

## 2022-02-24 DIAGNOSIS — E663 Overweight: Secondary | ICD-10-CM | POA: Diagnosis not present

## 2022-02-24 DIAGNOSIS — Z6825 Body mass index (BMI) 25.0-25.9, adult: Secondary | ICD-10-CM | POA: Diagnosis not present

## 2022-02-24 NOTE — Patient Instructions (Signed)
Nutrition Recommendations: - Let's start an adult multivitamin to ensure we're meeting micronutrient goals on this current regimen. I recommend Centrum Adult Liquid Multivitamin.

## 2022-02-25 ENCOUNTER — Encounter: Payer: Medicaid Other | Admitting: Speech Pathology

## 2022-03-04 ENCOUNTER — Ambulatory Visit: Payer: Medicaid Other | Attending: Pediatrics | Admitting: Speech Pathology

## 2022-03-04 DIAGNOSIS — T85733A Infection and inflammatory reaction due to implanted electronic neurostimulator of spinal cord, electrode (lead), initial encounter: Secondary | ICD-10-CM | POA: Diagnosis not present

## 2022-03-04 DIAGNOSIS — R4701 Aphasia: Secondary | ICD-10-CM | POA: Insufficient documentation

## 2022-03-04 DIAGNOSIS — F802 Mixed receptive-expressive language disorder: Secondary | ICD-10-CM | POA: Insufficient documentation

## 2022-03-04 DIAGNOSIS — F801 Expressive language disorder: Secondary | ICD-10-CM | POA: Insufficient documentation

## 2022-03-04 DIAGNOSIS — G911 Obstructive hydrocephalus: Secondary | ICD-10-CM | POA: Diagnosis not present

## 2022-03-04 DIAGNOSIS — R41841 Cognitive communication deficit: Secondary | ICD-10-CM | POA: Insufficient documentation

## 2022-03-04 DIAGNOSIS — S06360D Traumatic hemorrhage of cerebrum, unspecified, without loss of consciousness, subsequent encounter: Secondary | ICD-10-CM | POA: Diagnosis not present

## 2022-03-04 DIAGNOSIS — S06360A Traumatic hemorrhage of cerebrum, unspecified, without loss of consciousness, initial encounter: Secondary | ICD-10-CM | POA: Diagnosis not present

## 2022-03-04 NOTE — Therapy (Signed)
OUTPATIENT SPEECH LANGUAGE PATHOLOGY EVALUATION   Patient Name: Scott Bean MRN: [id number] DOB:[birth year], 31 y.o., male Today's Date: 03/04/2022  PCP: Janora Norlander, DO REFERRING PROVIDER: Rocky Link, MD   End of Session - 03/04/22 1532     Visit Number 3    Number of Visits 25    Date for SLP Re-Evaluation 03/19/22    Authorization Type Medicaid [employer] [place]    SLP Start Time 1532    SLP Stop Time  1619    SLP Time Calculation (min) 47 min    Activity Tolerance Patient tolerated treatment well              Past Medical History:  Diagnosis Date   Bacteremia 12/10/2011   Cellulitis 12/27/2012   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, implant, and graft 02/06/2013   PNA (pneumonia) 12/09/2011   Traumatic brain injury [facility])    Past Surgical History:  Procedure Laterality Date   decompressive craniotomy     implanation of intrathecal baclofen pump     PEG TUBE PLACEMENT     reimplantation of intrathecal baclofen pump     VENTRICULOPERITONEAL SHUNT     Patient Active Problem List   Diagnosis Date Noted   S/P percutaneous endoscopic gastrostomy (PEG) tube placement ([place]) 09/04/2021   Full incontinence of feces 09/04/2021   Urinary incontinence without sensory awareness 09/04/2021   At high risk for pressure injury of skin 09/04/2021   Irritation around percutaneous endoscopic gastrostomy (PEG) tube site ([place]) 08/30/2020   Upper respiratory infection with cough and congestion 08/13/2020   Transient alteration of awareness 03/18/2019   Spastic hemiplegia affecting nondominant side ([place]) 08/10/2012   Spastic hemiplegia affecting dominant side ([place]) 08/10/2012   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   Encounter for long-term (current) use of other medications 08/10/2012   H/O gastrointestinal disease 05/15/2011   Allergic state 05/15/2011   Obstructive hydrocephalus ([place]) 05/14/2011   Closed skull fracture  with intracranial injury, with loss of consciousness ([place]) 05/14/2011   Cognitive decline 05/14/2011   Hydrocephalus ([place]) 05/14/2011   Traumatic brain injury with loss of consciousness ([place]) 05/14/2011    ONSET DATE: Referral date 10/28/2021  REFERRING DIAG: S06.9X9S (ICD-10-CM) - Traumatic brain injury with loss of consciousness, sequela ([place])  THERAPY DIAG:  Cognitive communication deficit  Expressive language disorder  Nonverbal  Receptive language disorder  Rationale for Evaluation and Treatment Habilitation  SUBJECTIVE:   SUBJECTIVE STATEMENT: Pt arrives with both parents and device, report to having practiced at home sustaining eye gaze to select from FO2  PAIN:  Are you having pain?  No s/sx of pain evidenced  OBJECTIVE:   TODAY'S TREATMENT:  03-04-22: SLP educates on needs for successful SGD implementation: ability to use device, desire to use device to aid in communication, and intentional communication. SLP provided education on creating desirable communication opportunities with positive feedback. Modeled how to navigate device and modify icons, messages, and gaze duration. SLP added 2 novel buttons featuring pt preferred songs to enhance desirability of interaction with device. With FO2, pt able to select icon of choice, resulting in playing of song x7 with additional positive reinforcement from parents.   01-28-22: SLP presented pt with Tobii Dynavox device equipped with eye gaze technology to assess candidacy for usage. Use of various programs to determine if pt is able to track objects on screen and if device is able to detect pt's eye movement. Pt's mom report pt unseeing out of L  eye. Pt's eye gaze detected approximately 10% of trials with max-A for optimizing positioning, use of visual, verbal, tactile cues. SLP provided education to mother on facilitating choice making at home to provide practice for making choices and use of intentional communication. Mother  verbalizes understanding. Request device for trial with wheel chair mount accessory.   PATIENT EDUCATION: Education details: see above Person educated: Patient and Parents Education method: Customer service manager Education comprehension: verbalized understanding, returned demonstration, verbal cues required, and needs further education  GOALS: Goals reviewed with patient? Yes  SHORT TERM GOALS: Target date: 02/14/2022 (STG date changed d/t scheduling)   Pt will orient head to dictated icon from F:2 with 50% accuracy across 2 sessions Baseline: orients head to speaker, demonstrating head turn ability Goal status: NOT MET  2.  Pt will establish reliable y/n response with 50% accuracy across 2 sessions Baseline: per caregiver report, intermittent Y/N through eye blinks, no reliable Y/N during eval Goal status: NOT MET  3.  Pt's mother will demonstrate use of binary choice strategy to aid in successful communication of potential wants/needs with mod-I over 2 sessions Baseline: phonation for response Goal status: NOT MET   LONG TERM GOALS: Target date: 03/19/2022  Pt will orient head to dictated icon from F:2 with 70% accuracy across 2 sessions Baseline: orients head to speaker, demonstrating head turn ability Goal status: INITIAL  2.  Pt will indicate wants/needs/preference/fact using Y/N with 70% accuracy across 2 sessions Baseline: per caregiver report, intermittent Y/N through eye blinks, no reliable Y/N during eval Goal status: INITIAL  3.  Pt's parents will report implementation of binary choice strategy at home enabling pt expression of needs or preference over 1 week period Baseline: phonation for response Goal status: INITIAL  ASSESSMENT:  CLINICAL IMPRESSION: Pt has initiated trial device with Tobii Dynavox eye gaze SGD. Is able to sustain gaze and select from FO2, though no evidence thus far that selection is intentional. Continue per POC, expect that will need  to extend d/t delay in procuring device and scheduling issues d/t clinician availability and family illness.   OBJECTIVE IMPAIRMENTS include expressive language and receptive language. These impairments are limiting patient from effectively communicating at home and in community. Factors affecting potential to achieve goals and functional outcome are ability to learn/carryover information, co-morbidities, previous level of function, severity of impairments, and financial resources. Patient will benefit from skilled SLP services to address above impairments and improve overall function.  REHAB POTENTIAL: Fair    PLAN: SLP FREQUENCY: 2x/week  SLP DURATION: 12 weeks  PLANNED INTERVENTIONS: Language facilitation, Multimodal communication approach, SLP instruction and feedback, and Patient/family education  Managed medicaid CPT codes: [phone] - SLP treatment and Other [phone] - AAC evaluation, [phone] - AAC treatment   Su Monks, [place] 03/04/2022, 4:21 PM

## 2022-03-06 ENCOUNTER — Other Ambulatory Visit: Payer: Self-pay

## 2022-03-06 ENCOUNTER — Ambulatory Visit: Payer: Medicaid Other | Admitting: Speech Pathology

## 2022-03-06 DIAGNOSIS — R4701 Aphasia: Secondary | ICD-10-CM | POA: Diagnosis not present

## 2022-03-06 DIAGNOSIS — R41841 Cognitive communication deficit: Secondary | ICD-10-CM | POA: Diagnosis not present

## 2022-03-06 DIAGNOSIS — F801 Expressive language disorder: Secondary | ICD-10-CM

## 2022-03-06 DIAGNOSIS — F802 Mixed receptive-expressive language disorder: Secondary | ICD-10-CM

## 2022-03-06 NOTE — Therapy (Signed)
OUTPATIENT SPEECH LANGUAGE PATHOLOGY EVALUATION   Patient Name: Scott Bean MRN: [id number] DOB:04-26-1990, 31 y.o., male Today's Date: 03/06/2022  PCP: Janora Norlander, DO REFERRING PROVIDER: Rocky Link, MD   End of Session - 03/06/22 1443     Visit Number 4    Number of Visits 25    Date for SLP Re-Evaluation 03/19/22    Authorization Type Medicaid [facility]    SLP Start Time 1444    SLP Stop Time  1529    SLP Time Calculation (min) 45 min    Activity Tolerance Patient tolerated treatment well              Past Medical History:  Diagnosis Date   Bacteremia 12/10/2011   Cellulitis 12/27/2012   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, implant, and graft 02/06/2013   PNA (pneumonia) 12/09/2011   Traumatic brain injury [facility])    Past Surgical History:  Procedure Laterality Date   decompressive craniotomy     implanation of intrathecal baclofen pump     PEG TUBE PLACEMENT     reimplantation of intrathecal baclofen pump     VENTRICULOPERITONEAL SHUNT     Patient Active Problem List   Diagnosis Date Noted   S/P percutaneous endoscopic gastrostomy (PEG) tube placement ([place]) 09/04/2021   Full incontinence of feces 09/04/2021   Urinary incontinence without sensory awareness 09/04/2021   At high risk for pressure injury of skin 09/04/2021   Irritation around percutaneous endoscopic gastrostomy (PEG) tube site ([place]) 08/30/2020   Upper respiratory infection with cough and congestion 08/13/2020   Transient alteration of awareness 03/18/2019   Spastic hemiplegia affecting nondominant side ([place]) 08/10/2012   Spastic hemiplegia affecting dominant side ([place]) 08/10/2012   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   Encounter for long-term (current) use of other medications 08/10/2012   H/O gastrointestinal disease 05/15/2011   Allergic state 05/15/2011   Obstructive hydrocephalus ([place]) 05/14/2011   Closed skull fracture  with intracranial injury, with loss of consciousness ([place]) 05/14/2011   Cognitive decline 05/14/2011   Hydrocephalus ([place]) 05/14/2011   Traumatic brain injury with loss of consciousness ([place]) 05/14/2011    ONSET DATE: Referral date 10/28/2021  REFERRING DIAG: S06.9X9S (ICD-10-CM) - Traumatic brain injury with loss of consciousness, sequela ([place])  THERAPY DIAG:  Cognitive communication deficit  Expressive language disorder  Nonverbal  Receptive language disorder  Rationale for Evaluation and Treatment Habilitation  SUBJECTIVE:   SUBJECTIVE STATEMENT: Report no changes since Tuesday  PAIN:  Are you having pain?  No s/sx of pain evidenced  OBJECTIVE:   TODAY'S TREATMENT:  03-06-22: adjusted SGD settings 2x3 for FO6. Pt able to select "play music" icon x6, of own volition, does not demonstrate per verbal or visual cueing or model. SLP moves icon, pt able to select additional x1. Selects "yes" in response to if pt is ready to go home. SLP provided additional education and demonstration on modifying device settings to optimize use. Completed interests questionnaire to aid in personalization of device to aid in motivation to employ.   03-04-22: SLP educates on needs for successful SGD implementation: ability to use device, desire to use device to aid in communication, and intentional communication. SLP provided education on creating desirable communication opportunities with positive feedback. Modeled how to navigate device and modify icons, messages, and gaze duration. SLP added 2 novel buttons featuring pt preferred songs to enhance desirability of interaction with device. With FO2, pt able to select icon of choice, resulting  in playing of song x7 with additional positive reinforcement from parents.   01-28-22: SLP presented pt with Tobii Dynavox device equipped with eye gaze technology to assess candidacy for usage. Use of various programs to determine if pt is able to track objects on  screen and if device is able to detect pt's eye movement. Pt's mom report pt unseeing out of L eye. Pt's eye gaze detected approximately 10% of trials with max-A for optimizing positioning, use of visual, verbal, tactile cues. SLP provided education to mother on facilitating choice making at home to provide practice for making choices and use of intentional communication. Mother verbalizes understanding. Request device for trial with wheel chair mount accessory.   PATIENT EDUCATION: Education details: see above Person educated: Patient and Parents Education method: Customer service manager Education comprehension: verbalized understanding, returned demonstration, verbal cues required, and needs further education  GOALS: Goals reviewed with patient? Yes  SHORT TERM GOALS: Target date: 02/14/2022 (STG date changed d/t scheduling)   Pt will orient head to dictated icon from F:2 with 50% accuracy across 2 sessions Baseline: orients head to speaker, demonstrating head turn ability Goal status: NOT MET  2.  Pt will establish reliable y/n response with 50% accuracy across 2 sessions Baseline: per caregiver report, intermittent Y/N through eye blinks, no reliable Y/N during eval Goal status: NOT MET  3.  Pt's mother will demonstrate use of binary choice strategy to aid in successful communication of potential wants/needs with mod-I over 2 sessions Baseline: phonation for response Goal status: NOT MET   LONG TERM GOALS: Target date: 03/19/2022  Pt will orient head to dictated icon from F:2 with 70% accuracy across 2 sessions Baseline: orients head to speaker, demonstrating head turn ability Goal status: INITIAL  2.  Pt will indicate wants/needs/preference/fact using Y/N with 70% accuracy across 2 sessions Baseline: per caregiver report, intermittent Y/N through eye blinks, no reliable Y/N during eval Goal status: INITIAL  3.  Pt's parents will report implementation of binary choice  strategy at home enabling pt expression of needs or preference over 1 week period Baseline: phonation for response Goal status: INITIAL  ASSESSMENT:  CLINICAL IMPRESSION: Pt has initiated trial device with Tobii Dynavox eye gaze SGD. Is able to sustain gaze and select from FO2, though no evidence thus far that selection is intentional. Continue per POC, expect that will need to extend d/t delay in procuring device and scheduling issues d/t clinician availability and family illness.   OBJECTIVE IMPAIRMENTS include expressive language and receptive language. These impairments are limiting patient from effectively communicating at home and in community. Factors affecting potential to achieve goals and functional outcome are ability to learn/carryover information, co-morbidities, previous level of function, severity of impairments, and financial resources. Patient will benefit from skilled SLP services to address above impairments and improve overall function.  REHAB POTENTIAL: Fair    PLAN: SLP FREQUENCY: 2x/week  SLP DURATION: 12 weeks  PLANNED INTERVENTIONS: Language facilitation, Multimodal communication approach, SLP instruction and feedback, and Patient/family education  Managed medicaid CPT codes: [phone] - SLP treatment and Other [phone] - AAC evaluation, [phone] - AAC treatment   Su Monks, [place] 03/06/2022, 2:44 PM

## 2022-03-11 ENCOUNTER — Ambulatory Visit: Payer: Medicaid Other | Admitting: Speech Pathology

## 2022-03-11 DIAGNOSIS — S06360D Traumatic hemorrhage of cerebrum, unspecified, without loss of consciousness, subsequent encounter: Secondary | ICD-10-CM | POA: Diagnosis not present

## 2022-03-11 DIAGNOSIS — G911 Obstructive hydrocephalus: Secondary | ICD-10-CM | POA: Diagnosis not present

## 2022-03-11 DIAGNOSIS — T85733A Infection and inflammatory reaction due to implanted electronic neurostimulator of spinal cord, electrode (lead), initial encounter: Secondary | ICD-10-CM | POA: Diagnosis not present

## 2022-03-11 DIAGNOSIS — S06360A Traumatic hemorrhage of cerebrum, unspecified, without loss of consciousness, initial encounter: Secondary | ICD-10-CM | POA: Diagnosis not present

## 2022-03-12 ENCOUNTER — Encounter: Payer: Self-pay | Admitting: Gastroenterology

## 2022-03-12 ENCOUNTER — Ambulatory Visit: Payer: Medicaid Other | Admitting: Gastroenterology

## 2022-03-12 VITALS — Ht 79.0 in | Wt 226.0 lb

## 2022-03-12 DIAGNOSIS — G911 Obstructive hydrocephalus: Secondary | ICD-10-CM | POA: Diagnosis not present

## 2022-03-12 DIAGNOSIS — Z9889 Other specified postprocedural states: Secondary | ICD-10-CM | POA: Diagnosis not present

## 2022-03-12 DIAGNOSIS — S069X9S Unspecified intracranial injury with loss of consciousness of unspecified duration, sequela: Secondary | ICD-10-CM | POA: Diagnosis not present

## 2022-03-12 NOTE — Progress Notes (Signed)
Arlyss Repress, MD [address]  [address]  [place], [place] [address]  Main: [phone]  Fax: [phone]    Gastroenterology Consultation  Referring Provider:     Raliegh Ip, DO Primary Care Physician:  Raliegh Ip, DO Primary Gastroenterologist:  Dr. Arlyss Repress Reason for Consultation: Establish care        HPI:   CHISUM HABENICHT is a 31 y.o. male referred by Dr. Raliegh Ip, DO  for consultation & management of gastrostomy tube feeds.  Patient had traumatic brain injury at the age of 3 complicated by obstructive hydrocephalus, spastic hemiplegia on baclofen pump, cognitive decline with urinary and fecal incontinence, G-tube dependent.  Patient is accompanied by his parents today.  Patient has G-tube but done which is replaced by his mom every 3 months.  He has been tolerating tube feeds very well.  She does not have any concerns about G-tube functioning.  Denies any leakage, bleeding or foul-smelling discharge around the G-tube.  Reports having bowel movement daily with enema and MiraLAX.  Tube feeds have been cut down because of weight gain and hypercholesterolemia.  No evidence of anemia, LFTs have been normal Patient's mom wanted to establish care with GI because he had G-tube issues more than a year ago.  Patient receives digestive enzymes and probiotics daily.  NSAIDs: None  Antiplts/Anticoagulants/Anti thrombotics: None  GI Procedures: None  Past Medical History:  Diagnosis Date   Bacteremia 12/10/2011   Cellulitis 12/27/2012   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, implant, and graft 02/06/2013   PNA (pneumonia) 12/09/2011   Tra[facility] injury [facility])     Past Surgical History:  Procedure Laterality Date   decompressive craniotomy     implanation of intrathecal baclofen pump     PEG TUBE PLACEMENT     reimplantation of intrathecal baclofen pump     VENTRICULOPERITONEAL SHUNT       Current  Outpatient Medications:    Baclofen 5 MG TABS, Crush 2 tablets and dissolve in liquid.  Give per gtube TID PRN for baclofen pump failure, Disp: 10 tablet, Rfl: 0   clotrimazole-betamethasone (LOTRISONE) cream, APPLY TO THE AFFECTED AREA(S) DAILY AS NEEDED, Disp: 45 g, Rfl: 1   fluticasone (FLONASE) 50 MCG/ACT nasal spray, USE 2 SPRAYS IN EACH NOSTRIL ONCE DAILY., Disp: 16 g, Rfl: 5   Lactobacillus Rhamnosus, GG, (RA PROBIOTIC DIGESTIVE CARE) CAPS, Take 1 capsule by mouth daily., Disp: , Rfl:    levETIRAcetam (KEPPRA) 100 MG/ML solution, Take 15 mLs (1,500 mg total) by mouth 2 (two) times daily., Disp: 930 mL, Rfl: 5   Nutritional Supplements (NUTRITIONAL SUPPLEMENT PLUS) LIQD, 2 [name] give via gtube daily., Disp: 20150 mL, Rfl: 12   Nutritional Supplements (NUTRITIONAL SUPPLEMENT PLUS) LIQD, 2 Prosource (30 mL each) given via gtube daily. Mix each pouch with at least 60 mL of water., Disp: 1860 mL, Rfl: 12   polyethylene glycol powder (GLYCOLAX/MIRALAX) 17 GM/SCOOP powder, 17 g by Per G Tube route daily as needed for Constipation., Disp: , Rfl:    tacrolimus (PROTOPIC) 0.1 % ointment, Apply topically., Disp: , Rfl:    TEGRETOL 100 MG/5ML suspension, TAKE 16.5ML EVERY SIX HOURS, Disp: 2250 mL, Rfl: 5   Family History  Problem Relation Age of Onset   Lung cancer Maternal Grandmother        Died at 2   Liver cancer Maternal Grandmother    Heart Problems Maternal Grandfather  Died at 39   Seizures Neg Hx      Social History   Tobacco Use   Smoking status: Never   Smokeless tobacco: Never  Vaping Use   Vaping Use: Never used  Substance Use Topics   Alcohol use: No   Drug use: No    Allergies as of 03/12/2022 - Review Complete 03/12/2022  Allergen Reaction Noted   Clindamycin/lincomycin  12/05/2021    Review of Systems:    All systems reviewed and negative except where noted in HPI.   Physical Exam:  Ht 6\' 7"  (2.007 m) Comment: per mom  Wt 226 lb (102.5  kg) Comment: Per mom  BMI 25.46 kg/m  No LMP for male patient.  General:   Alert,  Well-developed, well-nourished, pleasant, in NAD Head:  Normocephalic and atraumatic. Eyes:  Sclera clear, no icterus.   Conjunctiva pink. Ears: Unable to assess Nose:  No deformity, discharge, or lesions. Mouth:  No deformity or lesions,oropharynx pink & moist. Neck:  Supple; no masses or thyromegaly. Lungs:  Respirations even and unlabored.  Clear throughout to auscultation.   No wheezes, crackles, or rhonchi. No acute distress. Heart:  Regular rate and rhythm; no murmurs, clicks, rubs, or gallops. Abdomen:  Normal bowel sounds. Soft, non-tender and non-distended without masses, hepatosplenomegaly or hernias noted.  No guarding or rebound tenderness.  G-tube in place, the site appears healthy.  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 Extremities:  No clubbing or edema.  No cyanosis. Neurologic:  Alert and oriented x0;  Skin:  Intact without significant lesions or rashes. No jaundice.  Imaging Studies: Reviewed  Assessment and Plan:   LAURANCE HEIDE is a 31 y.o. 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  Discussed about possible bacterial overgrowth and role of antibiotics in future if patient develops significant bloating   Follow up as needed   26, MD

## 2022-03-13 ENCOUNTER — Ambulatory Visit: Payer: Medicaid Other | Admitting: Speech Pathology

## 2022-03-17 NOTE — Therapy (Unsigned)
OUTPATIENT SPEECH LANGUAGE PATHOLOGY EVALUATION   Patient Name: Scott Bean MRN: [id number] DOB:[birth year], 31 y.o., male Today's Date: 03/18/2022  PCP: Janora Norlander, DO REFERRING PROVIDER: Rocky Link, MD   End of Session - 03/18/22 0912     Visit Number 5    Number of Visits 25    Date for SLP Re-Evaluation 03/19/22    Authorization Type Medicaid [employer]    SLP Start Time 0930    SLP Stop Time  5638    SLP Time Calculation (min) 45 min    Activity Tolerance Patient tolerated treatment well               Past Medical History:  Diagnosis Date   Bacteremia 12/10/2011   Cellulitis 12/27/2012   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, implant, and graft 02/06/2013   PNA (pneumonia) 12/09/2011   Traumatic brain injury [facility])    Past Surgical History:  Procedure Laterality Date   decompressive craniotomy     implanation of intrathecal baclofen pump     PEG TUBE PLACEMENT     reimplantation of intrathecal baclofen pump     VENTRICULOPERITONEAL SHUNT     Patient Active Problem List   Diagnosis Date Noted   S/P percutaneous endoscopic gastrostomy (PEG) tube placement ([place]) 09/04/2021   Full incontinence of feces 09/04/2021   Urinary incontinence without sensory awareness 09/04/2021   At high risk for pressure injury of skin 09/04/2021   Irritation around percutaneous endoscopic gastrostomy (PEG) tube site ([place]) 08/30/2020   Presence of intrathecal baclofen pump 08/02/2019   Transient alteration of awareness 03/18/2019   Essential (primary) hypertension 11/12/2017   Gastroesophageal reflux disease 11/12/2017   Spastic hemiplegia affecting nondominant side ([place]) 08/10/2012   Spastic hemiplegia affecting dominant side ([place]) 08/10/2012   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   Encounter for long-term (current) use of other medications 08/10/2012   H/O gastrointestinal disease 05/15/2011   Allergic state  05/15/2011   Obstructive hydrocephalus ([place]) 05/14/2011   Closed skull fracture with intracranial injury, with loss of consciousness ([place]) 05/14/2011   Cognitive decline 05/14/2011   Hydrocephalus ([place]) 05/14/2011   Traumatic brain injury with loss of consciousness ([place]) 05/14/2011    ONSET DATE: Referral date 10/28/2021  REFERRING DIAG: S06.9X9S (ICD-10-CM) - Traumatic brain injury with loss of consciousness, sequela ([place])  THERAPY DIAG:  Cognitive communication deficit  Expressive language disorder  Receptive language disorder  Nonverbal  Rationale for Evaluation and Treatment Habilitation  SUBJECTIVE:   SUBJECTIVE STATEMENT: [name] reports to daily home practice with device, with pt showing increasing abilities to maintain gaze duration such that icon is selected. [name] demonstrates modification of device to include icons with diversity of expression to meet communication needs (I love you, yes/no, greetings, requests for leisure tasks). Pt's mother is using yes/no questions, responding to communicative attempts (e.g. playing song with request with device).   PAIN:  Are you having pain?  No s/sx of pain evidenced  OBJECTIVE:   TODAY'S TREATMENT:  03-18-22: Device with 2x3 for FO6 with modifications to messages displayed on home screen setting: hi how are you, goodbye, yes, no, play music, scroll button. SLP modifies home screen to include motivating message for pt (fart sounds, as pt mother reports pt finds these amusing, will laugh). SLP places in location at which pt eye appears to naturally fall when looking at device (R center). Pt selects x4. SLP moves to top R, with pt able to select additional x3.  SLP moves to center R (pt not previously demonstrating selection in this location), with pt able to find and select button x3. Note that pt did have selections not targeted icon, however appears to intentionally search out and select target icon as he would select, look away, then  laugh at noises icon made. Selected same icon total of 10 times over 45 minute session in x3 unique locations. SLP advises that device trial to expire 03/20/2022, SLP has reached out to [employer] rep to ask for extension, will contact [name] to let her know outcome. Re-education on providing communication opportunities, implemented into home routine to reinforce concert of intentional and volitional communication. Asked for video records to support reports of attempts made at home d/t limited visits with SLP.   03-06-22: adjusted SGD settings 2x3 for [place]. Pt able to select "play music" icon x6, of own volition, does not demonstrate per verbal or visual cueing or model. SLP moves icon, pt able to select additional x1. Selects "yes" in response to if pt is ready to go home. SLP provided additional education and demonstration on modifying device settings to optimize use. Completed interests questionnaire to aid in personalization of device to aid in motivation to employ.   03-04-22: SLP educates on needs for successful SGD implementation: ability to use device, desire to use device to aid in communication, and intentional communication. SLP provided education on creating desirable communication opportunities with positive feedback. Modeled how to navigate device and modify icons, messages, and gaze duration. SLP added 2 novel buttons featuring pt preferred songs to enhance desirability of interaction with device. With FO2, pt able to select icon of choice, resulting in playing of song x7 with additional positive reinforcement from parents.   01-28-22: SLP presented pt with [employer] Dynavox device equipped with eye gaze technology to assess candidacy for usage. Use of various programs to determine if pt is able to track objects on screen and if device is able to detect pt's eye movement. Pt's mom report pt unseeing out of L eye. Pt's eye gaze detected approximately 10% of trials with max-A for optimizing positioning, use  of visual, verbal, tactile cues. SLP provided education to mother on facilitating choice making at home to provide practice for making choices and use of intentional communication. Mother verbalizes understanding. Request device for trial with wheel chair mount accessory.   PATIENT EDUCATION: Education details: see above Person educated: Patient and Parents Education method: Customer service manager Education comprehension: verbalized understanding, returned demonstration, verbal cues required, and needs further education  GOALS: Goals reviewed with patient? Yes  SHORT TERM GOALS: Target date: 02/14/2022 (STG date changed d/t scheduling)   Pt will orient head to dictated icon from F:2 with 50% accuracy across 2 sessions Baseline: orients head to speaker, demonstrating head turn ability Goal status: NOT MET  2.  Pt will establish reliable y/n response with 50% accuracy across 2 sessions Baseline: per caregiver report, intermittent Y/N through eye blinks, no reliable Y/N during eval Goal status: NOT MET  3.  Pt's mother will demonstrate use of binary choice strategy to aid in successful communication of potential wants/needs with mod-I over 2 sessions Baseline: phonation for response Goal status: NOT MET   LONG TERM GOALS: Target date: 03/19/2022  Pt will orient head to dictated icon from F:2 with 70% accuracy across 2 sessions Baseline: orients head to speaker, demonstrating head turn ability Goal status: INITIAL  2.  Pt will indicate wants/needs/preference/fact using Y/N with 70% accuracy across 2 sessions  Baseline: per caregiver report, intermittent Y/N through eye blinks, no reliable Y/N during eval Goal status: INITIAL  3.  Pt's parents will report implementation of binary choice strategy at home enabling pt expression of needs or preference over 1 week period Baseline: phonation for response Goal status: INITIAL  ASSESSMENT:  CLINICAL IMPRESSION: Pt has initiated  trial device with [employer] Dynavox eye gaze SGD. Is able to sustain gaze and select from FO2, though no evidence thus far that selection is intentional. Continue per POC, expect that will need to extend d/t delay in procuring device and scheduling issues d/t clinician availability and family illness.   OBJECTIVE IMPAIRMENTS include expressive language and receptive language. These impairments are limiting patient from effectively communicating at home and in community. Factors affecting potential to achieve goals and functional outcome are ability to learn/carryover information, co-morbidities, previous level of function, severity of impairments, and financial resources. Patient will benefit from skilled SLP services to address above impairments and improve overall function.  REHAB POTENTIAL: Fair    PLAN: SLP FREQUENCY: 2x/week  SLP DURATION: 12 weeks  PLANNED INTERVENTIONS: Language facilitation, Multimodal communication approach, SLP instruction and feedback, and Patient/family education  Managed medicaid CPT codes: [phone] - SLP treatment and Other [phone] - AAC evaluation, [phone] - AAC treatment   Su Monks, [place] 03/18/2022, 9:16 AM

## 2022-03-17 NOTE — Progress Notes (Signed)
   Baclofen Pump Interrogation, Programming, Refill  Date of Service: 03/24/2022     Patient Name: Scott Bean      MRN: [id number]      Date of Birth: November 17, 1990 Primary Care Physician: Janora Norlander, DO  Provider: Carylon Perches MD MPH   HPI: Mother reports no concerns since last appointment.    Indications:      Time Out:: Done immediately prior to procedure  Description:  The patient was placed in the supine position. The pump was interrogated and found to have a calculated residual volume of: 1.1 ml. There was  no redness or edema noted at the pump site. The pump reservoir site was prepped with betadine and draped using sterile technique per protocol. The pump was accessed on the  first attempt using the 22 gauge needle provided in the refill kit. The residual baclofen was withdrawn and measured to be: 5 ml. The pump reservoir was refilled with 41m of baclofen 5046m/ml  over 2 minute using sterile technique, deaccessed and the skin cleaned. The pump was reprogrammed as below.   Lot number: [id number] Expiration date: 07/2024    PLAN: Return day for next refill  Keep upcoming appointments with speech therapy and PM&R.  Confirmed patient has refills, no need for refills before next appointment.   StCarylon PerchesD MPH [facility] Neurology, Neurodevelopment and Ne[email or web address] [id number]: [phone]

## 2022-03-18 ENCOUNTER — Ambulatory Visit: Payer: Medicaid Other | Attending: Pediatrics | Admitting: Speech Pathology

## 2022-03-18 DIAGNOSIS — R4701 Aphasia: Secondary | ICD-10-CM | POA: Insufficient documentation

## 2022-03-18 DIAGNOSIS — R41841 Cognitive communication deficit: Secondary | ICD-10-CM | POA: Diagnosis not present

## 2022-03-18 DIAGNOSIS — F801 Expressive language disorder: Secondary | ICD-10-CM | POA: Diagnosis not present

## 2022-03-18 DIAGNOSIS — F802 Mixed receptive-expressive language disorder: Secondary | ICD-10-CM | POA: Diagnosis not present

## 2022-03-25 ENCOUNTER — Ambulatory Visit: Payer: Medicaid Other | Admitting: Speech Pathology

## 2022-03-25 DIAGNOSIS — R41841 Cognitive communication deficit: Secondary | ICD-10-CM

## 2022-03-25 DIAGNOSIS — F802 Mixed receptive-expressive language disorder: Secondary | ICD-10-CM

## 2022-03-25 DIAGNOSIS — R4701 Aphasia: Secondary | ICD-10-CM

## 2022-03-25 DIAGNOSIS — F801 Expressive language disorder: Secondary | ICD-10-CM | POA: Diagnosis not present

## 2022-03-25 NOTE — Therapy (Signed)
OUTPATIENT SPEECH LANGUAGE PATHOLOGY TREATMENT ([place])   Patient Name: Scott Bean MRN: [id number] DOB:07/14/1990, 31 y.o., male Today's Date: 03/25/2022  PCP: Janora Norlander, DO REFERRING PROVIDER: Rocky Link, MD   End of Session - 03/25/22 1522     Visit Number 6    Number of Visits 25    Date for SLP Re-Evaluation 04/22/22    Authorization Type Medicaid [facility]    SLP Start Time 1530    SLP Stop Time  7096    SLP Time Calculation (min) 45 min    Activity Tolerance Patient tolerated treatment well                Past Medical History:  Diagnosis Date   Bacteremia 12/10/2011   Cellulitis 12/27/2012   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, implant, and graft 02/06/2013   PNA (pneumonia) 12/09/2011   Traumatic brain injury [facility])    Past Surgical History:  Procedure Laterality Date   decompressive craniotomy     implanation of intrathecal baclofen pump     PEG TUBE PLACEMENT     reimplantation of intrathecal baclofen pump     VENTRICULOPERITONEAL SHUNT     Patient Active Problem List   Diagnosis Date Noted   S/P percutaneous endoscopic gastrostomy (PEG) tube placement ([place]) 09/04/2021   Full incontinence of feces 09/04/2021   Urinary incontinence without sensory awareness 09/04/2021   At high risk for pressure injury of skin 09/04/2021   Irritation around percutaneous endoscopic gastrostomy (PEG) tube site ([place]) 08/30/2020   Presence of intrathecal baclofen pump 08/02/2019   Transient alteration of awareness 03/18/2019   Essential (primary) hypertension 11/12/2017   Gastroesophageal reflux disease 11/12/2017   Spastic hemiplegia affecting nondominant side ([place]) 08/10/2012   Spastic hemiplegia affecting dominant side ([place]) 08/10/2012   Generalized convulsive epilepsy ([place]) 08/10/2012   Encounter for long-term (current) use of other medications 08/10/2012   H/O gastrointestinal disease 05/15/2011   Allergic  state 05/15/2011   Obstructive hydrocephalus ([place]) 05/14/2011   Closed skull fracture with intracranial injury, with loss of consciousness ([place]) 05/14/2011   Cognitive decline 05/14/2011   Hydrocephalus ([place]) 05/14/2011   Traumatic brain injury with loss of consciousness ([place]) 05/14/2011    ONSET DATE: Referral date 10/28/2021  REFERRING DIAG: S06.9X9S (ICD-10-CM) - Traumatic brain injury with loss of consciousness, sequela ([place])  THERAPY DIAG:  Cognitive communication deficit  Expressive language disorder  Receptive language disorder  Nonverbal  Rationale for Evaluation and Treatment Habilitation  SUBJECTIVE:   SUBJECTIVE STATEMENT: "We practice every day"   PAIN:  Are you having pain?  No s/sx of pain evidenced  OBJECTIVE:   TODAY'S TREATMENT:  03-25-22: Target expressive communication via speech generating device (SGD) with eye gaze technology. Screen set to 2x3, FO6. Pt able to select varying dictated icon in 65% of trials with usual repetition and visual A. Foil icon included (monotone talking) which pt selects incidentally at first, then avoids. Demonstrated what SLP judges to be intention by selecting same icon more than once and making laughing sound following (burp). Continues to select icon when moved to novel location. SLP provided education on modifcation of device, with mother able to ID potential modification which would increase pt engagement with device. She uses device interactively with pt, asking questions of pt to which he can respond with icons available. Caregivers endorse that pt is demonstrating perceived intentionality in responses at home. Plan to complete documentation requesting permanent device as pt is demonstrating  interest in, ability to use eye gaze to navigate, care givers evidencing ability to A in employment as communication tool.   03-18-22: Device with 2x3 for FO6 with modifications to messages displayed on home screen setting: hi how are you,  goodbye, yes, no, play music, scroll button. SLP modifies home screen to include motivating message for pt (fart sounds, as pt mother reports pt finds these amusing, will laugh). SLP places in location at which pt eye appears to naturally fall when looking at device (R center). Pt selects x4. SLP moves to top R, with pt able to select additional x3. SLP moves to center R (pt not previously demonstrating selection in this location), with pt able to find and select button x3. Note that pt did have selections not targeted icon, however appears to intentionally search out and select target icon as he would select, look away, then laugh at noises icon made. Selected same icon total of 10 times over 45 minute session in x3 unique locations. SLP advises that device trial to expire 03/20/2022, SLP has reached out to [employer] rep to ask for extension, will contact [name] to let her know outcome. Re-education on providing communication opportunities, implemented into home routine to reinforce concert of intentional and volitional communication. Asked for video records to support reports of attempts made at home d/t limited visits with SLP.   03-06-22: adjusted SGD settings 2x3 for [place]. Pt able to select "play music" icon x6, of own volition, does not demonstrate per verbal or visual cueing or model. SLP moves icon, pt able to select additional x1. Selects "yes" in response to if pt is ready to go home. SLP provided additional education and demonstration on modifying device settings to optimize use. Completed interests questionnaire to aid in personalization of device to aid in motivation to employ.   03-04-22: SLP educates on needs for successful SGD implementation: ability to use device, desire to use device to aid in communication, and intentional communication. SLP provided education on creating desirable communication opportunities with positive feedback. Modeled how to navigate device and modify icons, messages, and gaze  duration. SLP added 2 novel buttons featuring pt preferred songs to enhance desirability of interaction with device. With FO2, pt able to select icon of choice, resulting in playing of song x7 with additional positive reinforcement from parents.   01-28-22: SLP presented pt with [employer] Dynavox device equipped with eye gaze technology to assess candidacy for usage. Use of various programs to determine if pt is able to track objects on screen and if device is able to detect pt's eye movement. Pt's mom report pt unseeing out of L eye. Pt's eye gaze detected approximately 10% of trials with max-A for optimizing positioning, use of visual, verbal, tactile cues. SLP provided education to mother on facilitating choice making at home to provide practice for making choices and use of intentional communication. Mother verbalizes understanding. Request device for trial with wheel chair mount accessory.   PATIENT EDUCATION: Education details: see above Person educated: Patient and Parents Education method: Customer service manager Education comprehension: verbalized understanding, returned demonstration, verbal cues required, and needs further education  GOALS: Goals reviewed with patient? Yes  SHORT TERM GOALS: Target date: 02/14/2022 (STG date changed d/t scheduling)   Pt will orient head to dictated icon from F:2 with 50% accuracy across 2 sessions Baseline: orients head to speaker, demonstrating head turn ability Goal status: NOT MET  2.  Pt will establish reliable y/n response with 50% accuracy across 2  sessions Baseline: per caregiver report, intermittent Y/N through eye blinks, no reliable Y/N during eval Goal status: NOT MET  3.  Pt's mother will demonstrate use of binary choice strategy to aid in successful communication of potential wants/needs with mod-I over 2 sessions Baseline: phonation for response Goal status: NOT MET   LONG TERM GOALS: Target date: 04/22/22 (+4 weeks for  recert)  Pt will orient head to dictated icon from F:2 with 70% accuracy across 2 sessions Baseline: orients head to speaker, demonstrating head turn ability Goal status: NOT MET  2.  Pt will indicate wants/needs/preference/fact using Y/N with 70% accuracy across 2 sessions Baseline: per caregiver report, intermittent Y/N through eye blinks, no reliable Y/N during eval Goal status: IN PROGRESS  3.  Pt's parents will report implementation of binary choice strategy at home enabling pt expression of needs or preference over 1 week period Baseline: phonation for response Goal status: IN PROGRESS  4.  Pt will demonstrate selection of desired icon using eye gaze technology from [place] in 50% of opportunities  Baseline: 30% of opportunities with max-A  Goal status: new at recert  5.  Pt's caregivers will modify device to add at least 10 personally relevant icons with support from SLP PRN  Baseline: Mother has added 5  Goal status: new at recert  6.  Pt's caregivers will report perceived intentional use of device to communicate want or need 3+ times over 1 week period, with max-A from caregivers to employ device  Baseline: regular use of device for HEP practicing scanning device, not being used to determine wants/needs  Goal status: new at recert   ASSESSMENT:  CLINICAL IMPRESSION: Pt has initiated trial device with [employer] Dynavox eye gaze SGD. Is able to sustain gaze and select from FO2, though no evidence thus far that selection is intentional. Continue per POC, expect that will need to extend d/t delay in procuring device and scheduling issues d/t clinician availability and family illness.   OBJECTIVE IMPAIRMENTS include expressive language and receptive language. These impairments are limiting patient from effectively communicating at home and in community. Factors affecting potential to achieve goals and functional outcome are ability to learn/carryover information, co-morbidities, previous  level of function, severity of impairments, and financial resources. Patient will benefit from skilled SLP services to address above impairments and improve overall function.  REHAB POTENTIAL: Fair    PLAN: SLP FREQUENCY: 2x/week  SLP DURATION: 12 weeks  PLANNED INTERVENTIONS: Language facilitation, Multimodal communication approach, SLP instruction and feedback, and Patient/family education  Managed medicaid CPT codes: [phone] - SLP treatment and Other [phone] - AAC evaluation, [phone] - AAC treatment   Su Monks, [place] 03/25/2022, 3:31 PM

## 2022-03-27 ENCOUNTER — Ambulatory Visit: Payer: Medicaid Other | Admitting: Speech Pathology

## 2022-03-27 DIAGNOSIS — R41841 Cognitive communication deficit: Secondary | ICD-10-CM | POA: Diagnosis not present

## 2022-03-27 DIAGNOSIS — F801 Expressive language disorder: Secondary | ICD-10-CM

## 2022-03-27 DIAGNOSIS — R4701 Aphasia: Secondary | ICD-10-CM | POA: Diagnosis not present

## 2022-03-27 DIAGNOSIS — F802 Mixed receptive-expressive language disorder: Secondary | ICD-10-CM | POA: Diagnosis not present

## 2022-03-27 NOTE — Therapy (Signed)
OUTPATIENT SPEECH LANGUAGE PATHOLOGY TREATMENT ([facility])   Patient Name: Scott Bean MRN: [id number] DOB:03-22-1991, 31 y.o., male Today's Date: 03/28/2022  PCP: Janora Norlander, DO REFERRING PROVIDER: Rocky Link, MD   End of Session - 03/27/22 1359     Visit Number 7    Number of Visits 25    Date for SLP Re-Evaluation 04/22/22    Authorization Type Medicaid [employer]    SLP Start Time 1359    SLP Stop Time  1444    SLP Time Calculation (min) 45 min    Activity Tolerance Patient tolerated treatment well                 Past Medical History:  Diagnosis Date   Bacteremia 12/10/2011   Cellulitis 12/27/2012   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, implant, and graft 02/06/2013   PNA (pneumonia) 12/09/2011   Traumatic brain injury  Hospital)    Past Surgical History:  Procedure Laterality Date   decompressive craniotomy     implanation of intrathecal baclofen pump     PEG TUBE PLACEMENT     reimplantation of intrathecal baclofen pump     VENTRICULOPERITONEAL SHUNT     Patient Active Problem List   Diagnosis Date Noted   S/P percutaneous endoscopic gastrostomy (PEG) tube placement ([place]) 09/04/2021   Full incontinence of feces 09/04/2021   Urinary incontinence without sensory awareness 09/04/2021   At high risk for pressure injury of skin 09/04/2021   Irritation around percutaneous endoscopic gastrostomy (PEG) tube site ([place]) 08/30/2020   Presence of intrathecal baclofen pump 08/02/2019   Transient alteration of awareness 03/18/2019   Essential (primary) hypertension 11/12/2017   Gastroesophageal reflux disease 11/12/2017   Spastic hemiplegia affecting nondominant side ([place]) 08/10/2012   Spastic hemiplegia affecting dominant side ([place]) 08/10/2012   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   Encounter for long-term (current) use of other medications 08/10/2012   H/O gastrointestinal disease 05/15/2011   Allergic  state 05/15/2011   Obstructive hydrocephalus ([place]) 05/14/2011   Closed skull fracture with intracranial injury, with loss of consciousness ([place]) 05/14/2011   Cognitive decline 05/14/2011   Hydrocephalus ([place]) 05/14/2011   Traumatic brain injury with loss of consciousness ([place]) 05/14/2011    ONSET DATE: Referral date 10/28/2021  REFERRING DIAG: S06.9X9S (ICD-10-CM) - Traumatic brain injury with loss of consciousness, sequela ([place])  THERAPY DIAG:  Cognitive communication deficit  Expressive language disorder  Receptive language disorder  Nonverbal  Rationale for Evaluation and Treatment Habilitation  SUBJECTIVE:   SUBJECTIVE STATEMENT: "Good"   PAIN:  Are you having pain?  No s/sx of pain evidenced  OBJECTIVE:   TODAY'S TREATMENT:  03-27-22: SLP provided direct instruction for modification of current trial device. SLP instructed for movement of icons, modification of icon representation, use of read or recorded audio, modification of dwell time, modification of grid size. Pt's mother able to teach back with mod-A. They have support of family member who has demonstrated ability to modify device. SLP added additional motivating icons for pt to increase novelty and enjoyment for pt's practice sessions. With structured practice, pt selects dictated icon in 40% of trials. Note that pt appeared to be attempting to access correct icon more frequently (based on short dwell time evidenced) but was challenged to hold gaze for long enough. Pt with overt fatigue this date (eyes slow blinking, declining at time to interact with device) which was endorsed by mother. In response to Y/N questions, pt answered appropriately 40%  of trials with just 10% erroneous response. 50% of trials were no response.   03-25-22: Target expressive communication via speech generating device (SGD) with eye gaze technology. Screen set to 2x3, FO6. Pt able to select varying dictated icon in 65% of trials with usual  repetition and visual A. Foil icon included (monotone talking) which pt selects incidentally at first, then avoids. Demonstrated what SLP judges to be intention by selecting same icon more than once and making laughing sound following (burp). Continues to select icon when moved to novel location. SLP provided education on modifcation of device, with mother able to ID potential modification which would increase pt engagement with device. She uses device interactively with pt, asking questions of pt to which he can respond with icons available. Caregivers endorse that pt is demonstrating perceived intentionality in responses at home. Plan to complete documentation requesting permanent device as pt is demonstrating interest in, ability to use eye gaze to navigate, care givers evidencing ability to A in employment as communication tool.   03-18-22: Device with 2x3 for FO6 with modifications to messages displayed on home screen setting: hi how are you, goodbye, yes, no, play music, scroll button. SLP modifies home screen to include motivating message for pt (fart sounds, as pt mother reports pt finds these amusing, will laugh). SLP places in location at which pt eye appears to naturally fall when looking at device (R center). Pt selects x4. SLP moves to top R, with pt able to select additional x3. SLP moves to center R (pt not previously demonstrating selection in this location), with pt able to find and select button x3. Note that pt did have selections not targeted icon, however appears to intentionally search out and select target icon as he would select, look away, then laugh at noises icon made. Selected same icon total of 10 times over 45 minute session in x3 unique locations. SLP advises that device trial to expire 03/20/2022, SLP has reached out to [employer] rep to ask for extension, will contact [name] to let her know outcome. Re-education on providing communication opportunities, implemented into home routine to  reinforce concert of intentional and volitional communication. Asked for video records to support reports of attempts made at home d/t limited visits with SLP.   03-06-22: adjusted SGD settings 2x3 for [place]. Pt able to select "play music" icon x6, of own volition, does not demonstrate per verbal or visual cueing or model. SLP moves icon, pt able to select additional x1. Selects "yes" in response to if pt is ready to go home. SLP provided additional education and demonstration on modifying device settings to optimize use. Completed interests questionnaire to aid in personalization of device to aid in motivation to employ.   03-04-22: SLP educates on needs for successful SGD implementation: ability to use device, desire to use device to aid in communication, and intentional communication. SLP provided education on creating desirable communication opportunities with positive feedback. Modeled how to navigate device and modify icons, messages, and gaze duration. SLP added 2 novel buttons featuring pt preferred songs to enhance desirability of interaction with device. With FO2, pt able to select icon of choice, resulting in playing of song x7 with additional positive reinforcement from parents.   01-28-22: SLP presented pt with [employer] Dynavox device equipped with eye gaze technology to assess candidacy for usage. Use of various programs to determine if pt is able to track objects on screen and if device is able to detect pt's eye movement. Pt's mom  report pt unseeing out of L eye. Pt's eye gaze detected approximately 10% of trials with max-A for optimizing positioning, use of visual, verbal, tactile cues. SLP provided education to mother on facilitating choice making at home to provide practice for making choices and use of intentional communication. Mother verbalizes understanding. Request device for trial with wheel chair mount accessory.   PATIENT EDUCATION: Education details: see above Person educated:  Patient and Parents Education method: Customer service manager Education comprehension: verbalized understanding, returned demonstration, verbal cues required, and needs further education  GOALS: Goals reviewed with patient? Yes  SHORT TERM GOALS: Target date: 02/14/2022 (STG date changed d/t scheduling)   Pt will orient head to dictated icon from F:2 with 50% accuracy across 2 sessions Baseline: orients head to speaker, demonstrating head turn ability Goal status: NOT MET  2.  Pt will establish reliable y/n response with 50% accuracy across 2 sessions Baseline: per caregiver report, intermittent Y/N through eye blinks, no reliable Y/N during eval Goal status: NOT MET  3.  Pt's mother will demonstrate use of binary choice strategy to aid in successful communication of potential wants/needs with mod-I over 2 sessions Baseline: phonation for response Goal status: NOT MET   LONG TERM GOALS: Target date: 04/22/22 (+4 weeks for recert)  Pt will orient head to dictated icon from F:2 with 70% accuracy across 2 sessions Baseline: orients head to speaker, demonstrating head turn ability Goal status: NOT MET  2.  Pt will indicate wants/needs/preference/fact using Y/N with 70% accuracy across 2 sessions Baseline: per caregiver report, intermittent Y/N through eye blinks, no reliable Y/N during eval Goal status: IN PROGRESS  3.  Pt's parents will report implementation of binary choice strategy at home enabling pt expression of needs or preference over 1 week period Baseline: phonation for response Goal status: IN PROGRESS  4.  Pt will demonstrate selection of desired icon using eye gaze technology from [place] in 50% of opportunities  Baseline: 30% of opportunities with max-A  Goal status: new at recert  5.  Pt's caregivers will modify device to add at least 10 personally relevant icons with support from SLP PRN  Baseline: Mother has added 5  Goal status: new at recert  6.  Pt's  caregivers will report perceived intentional use of device to communicate want or need 3+ times over 1 week period, with max-A from caregivers to employ device  Baseline: regular use of device for HEP practicing scanning device, not being used to determine wants/needs  Goal status: new at recert   ASSESSMENT:  CLINICAL IMPRESSION: Pt has initiated trial device with [employer] Dynavox eye gaze SGD. Is able to sustain gaze and select from [place], with perceived intention in icon selection. Intention evidenced by selecting same icon in varying positions, selecting icon which elicits laugh repeatedly, responding to simple questions, and occasional ability to select dictated icon. Ongoing daily HEP practice reported at home. 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  OBJECTIVE IMPAIRMENTS include expressive language and receptive language. These impairments are limiting patient from effectively communicating at home and in community. Factors affecting potential to achieve goals and functional outcome are ability to learn/carryover information, co-morbidities, previous level of function, severity of impairments, and financial resources. Patient will benefit from skilled SLP services to address above impairments and improve overall function.  REHAB POTENTIAL: Fair    PLAN: SLP FREQUENCY: 2x/week  SLP DURATION: 12 weeks  PLANNED INTERVENTIONS: Language facilitation, Multimodal communication approach, SLP instruction  and feedback, and Patient/family education  Managed medicaid CPT codes: [phone] - SLP treatment and Other [phone] - AAC evaluation, [phone] - AAC treatment   Su Monks, CCC-SLP 03/28/2022, 7:56 AM

## 2022-04-02 ENCOUNTER — Ambulatory Visit: Payer: Medicaid Other | Admitting: Speech Pathology

## 2022-04-03 ENCOUNTER — Ambulatory Visit (INDEPENDENT_AMBULATORY_CARE_PROVIDER_SITE_OTHER): Payer: Medicaid Other | Admitting: Pediatrics

## 2022-04-03 DIAGNOSIS — G800 Spastic quadriplegic cerebral palsy: Secondary | ICD-10-CM | POA: Diagnosis not present

## 2022-04-03 DIAGNOSIS — G8110 Spastic hemiplegia affecting unspecified side: Secondary | ICD-10-CM

## 2022-04-03 DIAGNOSIS — G40309 Generalized idiopathic epilepsy and epileptic syndromes, not intractable, without status epilepticus: Secondary | ICD-10-CM

## 2022-04-06 MED ORDER — LEVETIRACETAM 100 MG/ML PO SOLN
1500.0000 mg | Freq: Two times a day (BID) | ORAL | 5 refills | Status: DC
Start: 2022-04-06 — End: 2022-07-08

## 2022-04-06 MED ORDER — TEGRETOL 100 MG/5ML PO SUSP: ORAL | 5 refills | Status: DC

## 2022-04-06 NOTE — Progress Notes (Signed)
   Baclofen Pump Interrogation, Programming, Refill  Date of Service: 04/03/22  Patient Name: Scott Bean      MRN: [id number]      Date of Birth: Jun 01, 1990 Primary Care Physician: Janora Norlander, DO  Provider: Carylon Perches MD MPH   HPI: Mother reports no concerns since last appointment.    Indications:      Time Out::  Done immediately prior to procedure  Description:  The patient was placed in the supine position. The pump was interrogated and found to have a calculated residual volume of: 4.7 ml. There was  no redness or edema noted at the pump site. Patient with continued mild erythema around gtube site. The pump reservoir site was prepped with betadine and draped using sterile technique per protocol. The pump was accessed on the first attempt using the 22 gauge needle provided in the refill kit. The residual baclofen was withdrawn and measured to be: 940 ml. The pump reservoir was refilled with 40 of baclofen 576mg/ml  over 2 minute using sterile technique, deaccessed and the skin cleaned. The pump was reprogrammed.  See report below for details.   LOT no. for baclofen: [id number] Expiration date:07/2024  PLAN: Return day for next refill  Keep upcoming appointment with PM&R Keppra and Tegretol refilled.   SCarylon PerchesMD MPH [facility] Neurology, Neurodevelopment and [facility] [address] [place] [id number]: [phone]

## 2022-04-08 DIAGNOSIS — G911 Obstructive hydrocephalus: Secondary | ICD-10-CM | POA: Diagnosis not present

## 2022-04-08 DIAGNOSIS — S06360A Traumatic hemorrhage of cerebrum, unspecified, without loss of consciousness, initial encounter: Secondary | ICD-10-CM | POA: Diagnosis not present

## 2022-04-08 DIAGNOSIS — S06360D Traumatic hemorrhage of cerebrum, unspecified, without loss of consciousness, subsequent encounter: Secondary | ICD-10-CM | POA: Diagnosis not present

## 2022-04-08 DIAGNOSIS — T85733A Infection and inflammatory reaction due to implanted electronic neurostimulator of spinal cord, electrode (lead), initial encounter: Secondary | ICD-10-CM | POA: Diagnosis not present

## 2022-04-09 DIAGNOSIS — S06360D Traumatic hemorrhage of cerebrum, unspecified, without loss of consciousness, subsequent encounter: Secondary | ICD-10-CM | POA: Diagnosis not present

## 2022-04-09 DIAGNOSIS — T85733A Infection and inflammatory reaction due to implanted electronic neurostimulator of spinal cord, electrode (lead), initial encounter: Secondary | ICD-10-CM | POA: Diagnosis not present

## 2022-04-09 DIAGNOSIS — G911 Obstructive hydrocephalus: Secondary | ICD-10-CM | POA: Diagnosis not present

## 2022-04-09 DIAGNOSIS — S06360A Traumatic hemorrhage of cerebrum, unspecified, without loss of consciousness, initial encounter: Secondary | ICD-10-CM | POA: Diagnosis not present

## 2022-04-11 DIAGNOSIS — S06360D Traumatic hemorrhage of cerebrum, unspecified, without loss of consciousness, subsequent encounter: Secondary | ICD-10-CM | POA: Diagnosis not present

## 2022-04-11 DIAGNOSIS — G911 Obstructive hydrocephalus: Secondary | ICD-10-CM | POA: Diagnosis not present

## 2022-04-11 DIAGNOSIS — S06360A Traumatic hemorrhage of cerebrum, unspecified, without loss of consciousness, initial encounter: Secondary | ICD-10-CM | POA: Diagnosis not present

## 2022-04-11 DIAGNOSIS — T85733A Infection and inflammatory reaction due to implanted electronic neurostimulator of spinal cord, electrode (lead), initial encounter: Secondary | ICD-10-CM | POA: Diagnosis not present

## 2022-04-12 ENCOUNTER — Encounter (INDEPENDENT_AMBULATORY_CARE_PROVIDER_SITE_OTHER): Payer: Self-pay | Admitting: Pediatrics

## 2022-04-30 ENCOUNTER — Encounter: Payer: Medicaid Other | Attending: Physical Medicine & Rehabilitation | Admitting: Physical Medicine & Rehabilitation

## 2022-04-30 ENCOUNTER — Encounter: Payer: Self-pay | Admitting: Physical Medicine & Rehabilitation

## 2022-04-30 VITALS — BP 144/98 | HR 74

## 2022-04-30 DIAGNOSIS — F068 Other specified mental disorders due to known physiological condition: Secondary | ICD-10-CM

## 2022-04-30 DIAGNOSIS — Z931 Gastrostomy status: Secondary | ICD-10-CM | POA: Insufficient documentation

## 2022-04-30 DIAGNOSIS — G8250 Quadriplegia, unspecified: Secondary | ICD-10-CM | POA: Insufficient documentation

## 2022-04-30 DIAGNOSIS — S069X9S Unspecified intracranial injury with loss of consciousness of unspecified duration, sequela: Secondary | ICD-10-CM | POA: Insufficient documentation

## 2022-04-30 DIAGNOSIS — S069X0S Unspecified intracranial injury without loss of consciousness, sequela: Secondary | ICD-10-CM | POA: Insufficient documentation

## 2022-04-30 DIAGNOSIS — R1312 Dysphagia, oropharyngeal phase: Secondary | ICD-10-CM | POA: Insufficient documentation

## 2022-04-30 MED ORDER — METHYLPHENIDATE HCL 5 MG PO TABS: 5.0000 mg | ORAL_TABLET | Freq: Two times a day (BID) | ORAL | 0 refills | Status: DC
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
## 2022-04-30 NOTE — Patient Instructions (Signed)
ALWAYS FEEL FREE TO CALL OUR OFFICE WITH ANY PROBLEMS OR QUESTIONS ([phone])  **PLEASE NOTE** ALL MEDICATION REFILL REQUESTS (INCLUDING CONTROLLED SUBSTANCES) NEED TO BE MADE AT LEAST 7 DAYS PRIOR TO REFILL BEING DUE. ANY REFILL REQUESTS INSIDE THAT TIME FRAME MAY RESULT IN DELAYS IN RECEIVING YOUR PRESCRIPTION.                      BALL WRIST/HAND SPLINT  NIGHT SPLINT  TAKE RITALIN ONCE IN AM FOR ONE WEEK AND INCREASE TO TWICE DAILY (1200) IF NO EFFECT.

## 2022-05-06 ENCOUNTER — Telehealth: Payer: Self-pay | Admitting: *Deleted

## 2022-05-06 NOTE — Telephone Encounter (Signed)
Apt scheduled for 05/27/2022 Scott Bean

## 2022-05-06 NOTE — Telephone Encounter (Signed)
Fax from [facility] for prescription and CMN for pt, whose SLP has recently recommended a speech generating device, to review recommendation and complete CMN & script w/ date of last F2F visit within the past 6 mos. Pt's last OV w/ PCP was 09/04/21 which is greater than 6 mos, pt NTBS in order for ppw to be completed.

## 2022-05-07 ENCOUNTER — Telehealth (INDEPENDENT_AMBULATORY_CARE_PROVIDER_SITE_OTHER): Payer: Self-pay | Admitting: Dietician

## 2022-05-07 NOTE — Telephone Encounter (Signed)
  Name of who is calling: Tressa Busman  Caller's Relationship to Patient: Mother  Best contact number: [phone]  Provider they see: Salvadore Oxford  Reason for call: Wanting to discuss sons nutrition plan.      PRESCRIPTION REFILL ONLY  Name of prescription:  Pharmacy:

## 2022-05-07 NOTE — Telephone Encounter (Signed)
RD returned phone call regarding Scott Bean's tube feeding regimen. RD left HIPAA compliant VM and will follow-up with MyChart message.

## 2022-05-09 ENCOUNTER — Encounter: Payer: Self-pay | Admitting: Physical Medicine & Rehabilitation

## 2022-05-09 DIAGNOSIS — S069X9S Unspecified intracranial injury with loss of consciousness of unspecified duration, sequela: Secondary | ICD-10-CM

## 2022-05-09 DIAGNOSIS — S06360A Traumatic hemorrhage of cerebrum, unspecified, without loss of consciousness, initial encounter: Secondary | ICD-10-CM | POA: Diagnosis not present

## 2022-05-09 DIAGNOSIS — G911 Obstructive hydrocephalus: Secondary | ICD-10-CM | POA: Diagnosis not present

## 2022-05-09 DIAGNOSIS — S06360D Traumatic hemorrhage of cerebrum, unspecified, without loss of consciousness, subsequent encounter: Secondary | ICD-10-CM | POA: Diagnosis not present

## 2022-05-09 DIAGNOSIS — T85733A Infection and inflammatory reaction due to implanted electronic neurostimulator of spinal cord, electrode (lead), initial encounter: Secondary | ICD-10-CM | POA: Diagnosis not present

## 2022-05-12 ENCOUNTER — Ambulatory Visit (INDEPENDENT_AMBULATORY_CARE_PROVIDER_SITE_OTHER): Payer: Medicaid Other | Admitting: Pediatrics

## 2022-05-12 ENCOUNTER — Encounter: Payer: Self-pay | Admitting: Family Medicine

## 2022-05-12 VITALS — Wt 217.0 lb

## 2022-05-12 DIAGNOSIS — G8250 Quadriplegia, unspecified: Secondary | ICD-10-CM | POA: Diagnosis not present

## 2022-05-12 DIAGNOSIS — G911 Obstructive hydrocephalus: Secondary | ICD-10-CM | POA: Diagnosis not present

## 2022-05-12 DIAGNOSIS — S06360D Traumatic hemorrhage of cerebrum, unspecified, without loss of consciousness, subsequent encounter: Secondary | ICD-10-CM | POA: Diagnosis not present

## 2022-05-12 DIAGNOSIS — T85733A Infection and inflammatory reaction due to implanted electronic neurostimulator of spinal cord, electrode (lead), initial encounter: Secondary | ICD-10-CM | POA: Diagnosis not present

## 2022-05-12 DIAGNOSIS — S06360A Traumatic hemorrhage of cerebrum, unspecified, without loss of consciousness, initial encounter: Secondary | ICD-10-CM | POA: Diagnosis not present

## 2022-05-12 MED ORDER — METHYLPHENIDATE HCL 5 MG PO TABS
5.0000 mg | ORAL_TABLET | Freq: Two times a day (BID) | ORAL | 0 refills | Status: DC
Start: 2022-05-12 — End: 2022-07-09

## 2022-05-13 ENCOUNTER — Encounter: Payer: Self-pay | Admitting: Family Medicine

## 2022-05-13 ENCOUNTER — Telehealth (INDEPENDENT_AMBULATORY_CARE_PROVIDER_SITE_OTHER): Payer: Medicaid Other | Admitting: Family Medicine

## 2022-05-13 DIAGNOSIS — H9212 Otorrhea, left ear: Secondary | ICD-10-CM | POA: Diagnosis not present

## 2022-05-13 MED ORDER — AMOXICILLIN-POT CLAVULANATE 875-125 MG PO TABS: 1.0000 | ORAL_TABLET | Freq: Two times a day (BID) | ORAL | 0 refills | Status: AC

## 2022-05-13 NOTE — Progress Notes (Signed)
Virtual Visit via telephone Note Due to COVID-19 pandemic this visit was conducted virtually. This visit type was conducted due to national recommendations for restrictions regarding the COVID-19 Pandemic (e.g. social distancing, sheltering in place) in an effort to limit this patient's exposure and mitigate transmission in our community. All issues noted in this document were discussed and addressed.  A physical exam was not performed with this format.   I connected with [name]'s mother on 05/13/2022 at 1345 by telephone and verified that I am speaking with the correct person using two identifiers. Scott Bean is currently located at home and mother is currently with them during visit. The provider, Monia Pouch, FNP is located in their office at time of visit.  I discussed the limitations, risks, security and privacy concerns of performing an evaluation and management service by virtual visit and the availability of in person appointments. I also discussed with the patient that there may be a patient responsible charge related to this service. The patient expressed understanding and agreed to proceed.  Subjective:  Patient ID: Scott Bean, male    DOB: Jul 24, 1990, 32 y.o.   MRN: [id number]  Chief Complaint:  Ear Drainage   HPI: [name] is a 32 y.o. male presenting on 05/13/2022 for Ear Drainage   Ear Drainage  There is pain in the left ear. This is a recurrent problem. The current episode started in the past 7 days. The problem occurs constantly. The problem has been gradually worsening. There has been no fever. Associated symptoms include ear discharge. Pertinent negatives include no abdominal pain, coughing, diarrhea, headaches, rash, rhinorrhea, sore throat or vomiting. He has tried ear drops for the symptoms. The treatment provided no relief.     Relevant past medical, surgical, family, and social history reviewed and updated as indicated.  Allergies and  medications reviewed and updated.   Past Medical History:  Diagnosis Date   Bacteremia 12/10/2011   Cellulitis 12/27/2012   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, implant, and graft 02/06/2013   PNA (pneumonia) 12/09/2011   Traumatic brain injury [facility])     Past Surgical History:  Procedure Laterality Date   decompressive craniotomy     implanation of intrathecal baclofen pump     PEG TUBE PLACEMENT     reimplantation of intrathecal baclofen pump     VENTRICULOPERITONEAL SHUNT      Social History   Socioeconomic History   Marital status: Single    Spouse name: Not on file   Number of children: Not on file   Years of education: Not on file   Highest education level: Not on file  Occupational History   Not on file  Tobacco Use   Smoking status: Never   Smokeless tobacco: Never  Vaping Use   Vaping Use: Never used  Substance and Sexual Activity   Alcohol use: No   Drug use: No   Sexual activity: Never  Other Topics Concern   Not on file  Social History Narrative      Scott Bean graduated from [employer] in 2016.    He lives with his mother.   Social Determinants of Health   Financial Resource Strain: Not on file  Food Insecurity: Not on file  Transportation Needs: Not on file  Physical Activity: Not on file  Stress: Not on file  Social Connections: Not on file  Intimate Partner Violence: Not on file    Outpatient Encounter Medications as  of 05/13/2022  Medication Sig   amoxicillin-clavulanate (AUGMENTIN) 875-125 MG tablet Take 1 tablet by mouth 2 (two) times daily for 10 days.   Baclofen 5 MG TABS Crush 2 tablets and dissolve in liquid.  Give per gtube TID PRN for baclofen pump failure   clotrimazole-betamethasone (LOTRISONE) cream APPLY TO THE AFFECTED AREA(S) DAILY AS NEEDED   fluticasone (FLONASE) 50 MCG/ACT nasal spray USE 2 SPRAYS IN EACH NOSTRIL ONCE DAILY.   Lactobacillus Rhamnosus, GG, (RA PROBIOTIC DIGESTIVE CARE) CAPS  Take 1 capsule by mouth daily.   levETIRAcetam (KEPPRA) 100 MG/ML solution Take 15 mLs (1,500 mg total) by mouth 2 (two) times daily.   methylphenidate (RITALIN) 5 MG tablet Take 1 tablet (5 mg total) by mouth 2 (two) times daily with breakfast and lunch.   Nutritional Supplements (NUTRITIONAL SUPPLEMENT PLUS) LIQD 2 [employer] [place] Standard 1.0 give via gtube daily.   Nutritional Supplements (NUTRITIONAL SUPPLEMENT PLUS) LIQD 2 Prosource (30 mL each) given via gtube daily. Mix each pouch with at least 60 mL of water.   polyethylene glycol powder (GLYCOLAX/MIRALAX) 17 GM/SCOOP powder 17 g by Per G Tube route daily as needed for Constipation.   tacrolimus (PROTOPIC) 0.1 % ointment Apply topically.   TEGRETOL 100 MG/5ML suspension TAKE 16.5ML EVERY SIX HOURS   No facility-administered encounter medications on file as of 05/13/2022.    Allergies  Allergen Reactions   Clindamycin/Lincomycin     Ointment caused increased redness    Review of Systems  Unable to perform ROS: Other (ROS per mother)  Constitutional:  Negative for activity change, appetite change, chills, fatigue and fever.  HENT:  Positive for ear discharge. Negative for rhinorrhea and sore throat.   Eyes: Negative.   Respiratory:  Negative for cough, chest tightness and shortness of breath.   Cardiovascular:  Negative for chest pain, palpitations and leg swelling.  Gastrointestinal:  Negative for abdominal pain, blood in stool, constipation, diarrhea, nausea and vomiting.  Endocrine: Negative.   Genitourinary:  Negative for decreased urine volume, difficulty urinating, dysuria, frequency and urgency.  Musculoskeletal:  Negative for arthralgias and myalgias.  Skin: Negative.  Negative for rash.  Allergic/Immunologic: Negative.   Neurological:  Negative for dizziness and headaches.  Hematological: Negative.   Psychiatric/Behavioral:  Negative for confusion, hallucinations, sleep disturbance and suicidal ideas.   All other systems  reviewed and are negative.        Observations/Objective: No vital signs or physical exam, this was a virtual health encounter.  Pt alert and oriented, answers all questions appropriately, and able to speak in full sentences.    Assessment and Plan: Christophe was seen today for ear drainage.  Diagnoses and all orders for this visit:  Otorrhea of left ear Ongoing otorrhea of left ear, now purulent with some blood. Will treat with below. Mother aware if symptoms worsen or are persistent, pt will need to be evaluated in person.  -     amoxicillin-clavulanate (AUGMENTIN) 875-125 MG tablet; Take 1 tablet by mouth 2 (two) times daily for 10 days.     Follow Up Instructions: Return if symptoms worsen or fail to improve.    I discussed the assessment and treatment plan with the patient. The patient was provided an opportunity to ask questions and all were answered. The patient agreed with the plan and demonstrated an understanding of the instructions.   The patient was advised to call back or seek an in-person evaluation if the symptoms worsen or if the condition fails to improve as  anticipated.  The above assessment and management plan was discussed with the patient. The patient verbalized understanding of and has agreed to the management plan. Patient is aware to call the clinic if they develop any new symptoms or if symptoms persist or worsen. Patient is aware when to return to the clinic for a follow-up visit. Patient educated on when it is appropriate to go to the emergency department.    I provided 15 minutes of time during this telephone encounter.   Monia Pouch, FNP-C [facility] [address] [place], [place] [address] [phone] 05/13/2022

## 2022-05-15 NOTE — Progress Notes (Signed)
This is a Pediatric Specialist E-Visit follow up consult provided via [place] Visit. Scott Bean and their parent/guardian consented to an E-Visit consult today.  Location of patient: Scott Bean is in ***.  Location of provider: Carney Bern, RD is at [facility]).  This visit was done via [place] - Progress Note Appt start time: *** Appt end time: *** Reason for referral: Gtube dependence Referring provider: Dr. Rogers Blocker - Neuro Pertinent medical hx: Spastic hemiplegia, epilepsy, obstructive hydrocephalus, TBI with loss of consciousness, cognitive decline, h/o gastrointestinal disease, transient alteration of awareness, urinary and fecal incontinence, high risk for pressure injury of skin, dysphagia, +gtube  Assessment: Food allergies: none Pertinent Medications: see medication list Vitamins/Supplements: none Pertinent labs:  (5/31) TSH, Free T4, CBC - WNL  (5/31) Lipid Panel: Total Cholesterol - 217 (high), TG - 245 (high), HDL - 59 (WNL), VLDL - 42 (high), LDL - 116 (high)  No anthropometrics taken on *** due to virtual appointment. Most recent anthropometrics 2/5 were used to determine dietary needs.   (2/5) Anthropometrics: Ht: 200 cm   Wt: 98.6 kg (217 lb) BMI: 24.45 kg IBW based on [place] Equation: 100 kg (+/- 10%)  02/24/22 Wt: 103.6 kg (*mom reports weight was 226#*) 01/13/22 Wt: 102 kg (226 # 6.4 oz) 12/05/21 Wt: 104.2 kg (229 # 12.8 oz) 10/24/21 Wt: 105.4 kg (231 # 9.6 oz)  UBW: ~200 #    Estimated minimum caloric needs: 1225 kcal/day (10% decrease from baseline given weight gain with current regimen) *** Estimated minimum protein needs: 0.8 g/kg/day (DRI) Estimated minimum fluid needs: *** mL/day (1 mL/kcal)   Primary concerns today: Follow-up given pt with gtube dependence. Mom accompanied pt to appt virtual today.   Dietary Intake Hx: DME: [employer] [facility]   Formula: [employer] [place] Standard 1.4 and [employer] [place]  Standard 1.0 Current regimen:  Day feeds: 325 mL (1 carton) via gravity feeds x 3 feeds  @ 8 AM, 1 PM, 6 PM (mom will give 1/2 then will wait 30 minutes then other 1/2 of carton) Overnight feeds: none Total Volume: 2 cartons [employer] [place] Standard 1.0, 1 carton [employer] Standard 1.4   FWF: 160 mL with each feed x3, 60 mL after each feed and after medications x4, 120 mL diet cranberry juice @ 6 AM, 180 mL water with Miralax (1200 mL total) Nutrition Supplement: 2 Prosource daily  Previous Supplements Tried: 2kcal HN (gas/bloating)   Feed positioning/location: 15-80 degree incline PO foods: none PO beverages: none   Notes: Mom notes that Scott Bean has been on [employer] for about a year. Prior to [employer] he was on a 2 kcal HN formula for many years. Scott Bean was switched to [employer] due to gas and bloating, mom notes improvement with tolerance given slow weight loss. Scott Bean was previously consuming some foods PO, however developed aspiration pneumonia and currently consumes nutrition only via gtube. ***  GI: daily, runny consistency, but improving and thickening up - enema and Miralax daily *** GU: "a lot" - very clear ***  Physical Activity: wheelchair bound  Estimated caloric intake: *** kcal/day - meets ***% of estimated needs Estimated protein intake: *** g/kg/day - meets ***% of estimated needs Estimated fluid intake: *** mL/day - meets ***% of estimated needs  Micronutrient Intake  Vitamin A  mcg  Vitamin C  mg  Vitamin D  mcg  Vitamin E  mg  Vitamin K  mcg  Vitamin B1 (thiamin)  mg  Vitamin B2 (riboflavin)  mg  Vitamin B3 (niacin)  mg  Vitamin B5 (pantothenic acid)  mg  Vitamin B6  mg  Vitamin B7 (biotin)  mcg  Vitamin B9 (folate)  mcg  Vitamin B12  mcg  Choline  mg  Calcium  mg  Chromium  mcg  Copper  mcg  Fluoride  mg  Iodine  mcg  Iron  mg  Magnesium  mg  Manganese  mg  Molybdenum  mcg  Phosphorous  mg  Selenium  mcg  Zinc  mg  Potassium  mg  Sodium  mg  Chloride   mg  Fiber  g   Nutrition Diagnosis: (7/24) Inadequate oral intake related to dysphagia and NPO status as evidenced by pt dependent on Gtube feedings to meet nutritional needs. (10/12) Overweight related excess energy intake in the setting of hypometabolic state as evidenced by BMI of 25.5. ***  Intervention: Discussed pt's weight trends and current regimen in detail. Discussed recommendations below. All questions answered, family in agreement with plan.   Nutrition Recommendations sent via MyChart message: - ***  Teach back method used.  Monitoring/Evaluation: Continue to Monitor: - Weight trends  - TF tolerance  - Need for Peptide formula - Ability to decrease free water   Follow-up in ***.  Total time spent in counseling: *** minutes.

## 2022-05-19 NOTE — Progress Notes (Signed)
   Baclofen Pump Interrogation, Programming, Refill  Date of Service: 05/19/2022     Patient Name: Scott Bean      MRN: [id number]      Date of Birth: 19-Oct-1990 Primary Care Physician: Janora Norlander, DO  Provider: Carylon Perches MD MPH   HPI: Mother reports no concerns since last appointment.  Scott Bean saw PM&R who are going to help with equipment and recommended stimulants to improve communication skills, per mother.   Indications:      Time Out::  Done immediately prior to procedure  Description:  The patient was placed in the supine position. The pump was interrogated and found to have a calculated residual volume of: 4.7 ml.  Prior to procedure, location of pump was estimated 5.3cm from medial line of scar and 7 mm down. There was no redness or edema noted at the pump site. The pump reservoir site was prepped with betadine and draped using sterile technique per protocol. The pump was accessed on the first attempt using the 22 gauge needle provided in the refill kit. The residual baclofen was withdrawn and measured to be: 6 ml. The pump reservoir was refilled with 40 of baclofen 574mcg/ml  over 2 minute using sterile technique, deaccessed and the skin cleaned. The pump was reprogrammed as below.    LOT no. for baclofen: 2144-118 Expiration date:07/2024   PLAN: Return day for next refill scheduled Continue medications at current doses Keep follow-up appointment with [name] to monitor weight.    Carylon Perches MD MPH [facility] Neurology, Neurodevelopment and [facility]  [place], [place], [place] [address] Phone: [phone]

## 2022-05-21 DIAGNOSIS — G8250 Quadriplegia, unspecified: Secondary | ICD-10-CM | POA: Diagnosis not present

## 2022-05-21 DIAGNOSIS — G8114 Spastic hemiplegia affecting left nondominant side: Secondary | ICD-10-CM | POA: Diagnosis not present

## 2022-05-21 DIAGNOSIS — S06890A Other specified intracranial injury without loss of consciousness, initial encounter: Secondary | ICD-10-CM | POA: Diagnosis not present

## 2022-05-27 ENCOUNTER — Encounter: Payer: Self-pay | Admitting: Family Medicine

## 2022-05-27 ENCOUNTER — Ambulatory Visit (INDEPENDENT_AMBULATORY_CARE_PROVIDER_SITE_OTHER): Payer: Medicaid Other | Admitting: Family Medicine

## 2022-05-27 ENCOUNTER — Telehealth: Payer: Self-pay | Admitting: Family Medicine

## 2022-05-27 VITALS — BP 108/70 | HR 78 | Temp 97.7°F

## 2022-05-27 DIAGNOSIS — N3942 Incontinence without sensory awareness: Secondary | ICD-10-CM | POA: Diagnosis not present

## 2022-05-27 DIAGNOSIS — G40309 Generalized idiopathic epilepsy and epileptic syndromes, not intractable, without status epilepticus: Secondary | ICD-10-CM | POA: Diagnosis not present

## 2022-05-27 DIAGNOSIS — R159 Full incontinence of feces: Secondary | ICD-10-CM

## 2022-05-27 DIAGNOSIS — Z9189 Other specified personal risk factors, not elsewhere classified: Secondary | ICD-10-CM

## 2022-05-27 DIAGNOSIS — Z931 Gastrostomy status: Secondary | ICD-10-CM | POA: Diagnosis not present

## 2022-05-27 DIAGNOSIS — G8110 Spastic hemiplegia affecting unspecified side: Secondary | ICD-10-CM

## 2022-05-27 NOTE — Progress Notes (Unsigned)
Subjective: CC:*** PCP: Janora Norlander, DO [id number] Scott Bean is a 32 y.o. male presenting to clinic today for:  1. 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  Give per gtube TID PRN for baclofen pump failure, Disp: 10 tablet, Rfl: 0   clotrimazole-betamethasone (LOTRISONE) cream, APPLY TO THE AFFECTED AREA(S) DAILY AS NEEDED, Disp: 45 g, Rfl: 1   fluticasone (FLONASE) 50 MCG/ACT nasal spray, USE 2 SPRAYS IN EACH NOSTRIL ONCE DAILY., Disp: 16 g, Rfl: 5   Lactobacillus Rhamnosus, GG, (RA PROBIOTIC DIGESTIVE CARE) CAPS, Take 1 capsule by mouth daily., Disp: , Rfl:    methylphenidate (RITALIN) 5 MG tablet, Take 1 tablet (5 mg total) by mouth 2 (two) times daily with breakfast and lunch., Disp: 60 tablet, Rfl: 0   Nutritional Supplements (NUTRITIONAL SUPPLEMENT PLUS) LIQD, 2 [employer] [place] Standard 1.0 give via gtube daily., Disp: 20150 mL, Rfl: 12   Nutritional Supplements (NUTRITIONAL SUPPLEMENT PLUS) LIQD, 2 Prosource (30 mL each) given via gtube daily. Mix each pouch with at least 60 mL of water., Disp: 1860 mL, Rfl: 12   polyethylene glycol powder (GLYCOLAX/MIRALAX) 17 GM/SCOOP powder, 17 g by Per G Tube route daily as needed for Constipation., Disp: , Rfl:    RESTASIS 0.05 % ophthalmic emulsion, Place 1 drop into both eyes 2 (two) times daily., Disp: , Rfl:    tacrolimus (PROTOPIC) 0.1 % ointment, Apply topically., Disp: , Rfl:    TEGRETOL 100 MG/5ML suspension, TAKE 16.5ML EVERY SIX HOURS, Disp: 2250 mL, Rfl: 5   levETIRAcetam (KEPPRA) 100 MG/ML solution, Take 15  mLs (1,500 mg total) by mouth 2 (two) times daily., Disp: 930 mL, Rfl: 5 Social History   Socioeconomic History   Marital status: Single    Spouse name: Not on file   Number of children: Not on file   Years of education: Not on file   Highest education level: Not on file  Occupational History   Not on file  Tobacco Use   Smoking status: Never   Smokeless tobacco: Never  Vaping Use   Vaping Use: Never used  Substance and Sexual Activity   Alcohol use: No   Drug use: No   Sexual activity: Never  Other Topics Concern   Not on file  Social History Narrative      [name] graduated from [employer] in 2016.    He lives with his mother.   Social Determinants of Health   Financial Resource Strain: Not on file  Food Insecurity: Not on file  Transportation Needs: Not on file  Physical Activity: Not on file  Stress: Not on file  Social Connections: Not on file  Intimate Partner Violence: Not on file   Family History  Problem Relation Age of Onset   Lung cancer Maternal Grandmother        Died at 27   Liver cancer Maternal Grandmother    Heart Problems Maternal Grandfather        Died at 93   Seizures Neg Hx     Objective:  Office vital signs reviewed. BP 108/70   Pulse 78   Temp 97.7 F (36.5 Scott) (Temporal)   SpO2 98%   Physical Examination:  General: Awake, alert, *** nourished, No acute distress HEENT: Normal    Neck: No masses palpated. No lymphadenopathy    Ears: Tympanic membranes intact, normal light reflex, no erythema, no bulging    Eyes: PERRLA, extraocular membranes intact, sclera ***    Nose: nasal turbinates moist, *** nasal discharge    Throat: moist mucus membranes, no erythema, *** tonsillar exudate.  Airway is patent Cardio: regular rate and rhythm, S1S2 heard, no murmurs appreciated Pulm: clear to auscultation bilaterally, no wheezes, rhonchi or rales; normal work of breathing on room air GI: soft, non-tender, non-distended, bowel sounds  present x4, no hepatomegaly, no splenomegaly, no masses GU: external vaginal tissue ***, cervix ***, *** punctate lesions on cervix appreciated, *** discharge from cervical os, *** bleeding, *** cervical motion tenderness, *** abdominal/ adnexal masses Extremities: warm, well perfused, No edema, cyanosis or clubbing; +*** pulses bilaterally MSK: *** gait and *** station Skin: dry; intact; no rashes or lesions Neuro: *** Strength and light touch sensation grossly intact, *** DTRs ***/4  Assessment/ Plan: 32 y.o. male   ***  No orders of the defined types were placed in this encounter.  No orders of the defined types were placed in this encounter.    Janora Norlander, DO [place] [phone]

## 2022-05-27 NOTE — Telephone Encounter (Signed)
That is fine.  If you can just make sure to block 1 additional slot in the pm so that it accounts for the overbook.

## 2022-05-27 NOTE — Patient Instructions (Signed)
Due for labs in May/ at annual physical. I'll reach out to Dr Rogers Blocker to see if there is anything specific she wants drawn.

## 2022-05-27 NOTE — Telephone Encounter (Signed)
Pt is scheduled and I compensated for the overbook.

## 2022-05-27 NOTE — Telephone Encounter (Signed)
They said it would be for a physical for the pt. I could put him in at 3:15 but then you would have 4 physicals in the afternoon for that day. Is that ok?

## 2022-05-28 ENCOUNTER — Encounter: Payer: Self-pay | Admitting: Family Medicine

## 2022-05-29 ENCOUNTER — Encounter (INDEPENDENT_AMBULATORY_CARE_PROVIDER_SITE_OTHER): Payer: Self-pay | Admitting: Dietician

## 2022-05-29 ENCOUNTER — Encounter (INDEPENDENT_AMBULATORY_CARE_PROVIDER_SITE_OTHER): Payer: Self-pay

## 2022-05-29 ENCOUNTER — Ambulatory Visit (INDEPENDENT_AMBULATORY_CARE_PROVIDER_SITE_OTHER): Payer: Medicaid Other | Admitting: Dietician

## 2022-05-29 DIAGNOSIS — Z931 Gastrostomy status: Secondary | ICD-10-CM

## 2022-05-29 DIAGNOSIS — R638 Other symptoms and signs concerning food and fluid intake: Secondary | ICD-10-CM | POA: Diagnosis not present

## 2022-05-29 DIAGNOSIS — R1310 Dysphagia, unspecified: Secondary | ICD-10-CM | POA: Diagnosis not present

## 2022-05-29 DIAGNOSIS — R1312 Dysphagia, oropharyngeal phase: Secondary | ICD-10-CM

## 2022-05-29 MED ORDER — NUTRITIONAL SUPPLEMENT PLUS PO LIQD: ORAL | 12 refills | Status: AC

## 2022-05-29 NOTE — Progress Notes (Signed)
RD faxed updated orders for 2 [facility] Standard 1.4 and 1 [employer] [place] Standard 1.0 to [employer] @ [phone].

## 2022-05-29 NOTE — Patient Instructions (Signed)
Nutrition Recommendations sent via MyChart message: - Continue current regimen. I will update the order with [employer] today for [place] Standard 1.4, 1 [address] Standard 1.0.  - Let me know [name]'s weight next time you guys see Dr. Rogers Blocker.

## 2022-06-04 NOTE — Telephone Encounter (Signed)
Placed on PCP's desk.

## 2022-06-04 NOTE — Telephone Encounter (Signed)
[employer] called stating that they can't use the paperwork that was filled out for patient because its dated before patient had the visit on 05/27/22. Needs to be filled out again and resent. Says she faxed a new request on 05/30/2022. Will fax again in case we didn't receive copy.

## 2022-06-06 NOTE — Telephone Encounter (Signed)
Form faxed back.

## 2022-06-10 ENCOUNTER — Telehealth: Payer: Self-pay | Admitting: Family Medicine

## 2022-06-10 NOTE — Telephone Encounter (Signed)
Mom called to make sure OV notes were sent to [employer] so that patient can get his communication device.

## 2022-06-23 ENCOUNTER — Ambulatory Visit (INDEPENDENT_AMBULATORY_CARE_PROVIDER_SITE_OTHER): Payer: Medicaid Other | Admitting: Pediatrics

## 2022-06-23 DIAGNOSIS — G8110 Spastic hemiplegia affecting unspecified side: Secondary | ICD-10-CM | POA: Diagnosis not present

## 2022-06-23 DIAGNOSIS — G40309 Generalized idiopathic epilepsy and epileptic syndromes, not intractable, without status epilepticus: Secondary | ICD-10-CM

## 2022-06-23 NOTE — Patient Instructions (Addendum)
    Medication     Tegretol       Week 1 12ml every 6 hours   Week 2 11.75ml every 6 hours   Week 3 51ml every 6 hours   Week 4 6.74ml every 6 hours   Week 5 68ml every 6 hours   Week 6 49ml every 6 hours   Week 7 and continue off   Please call Scott Bean has anything that looks like seizure. Our plan will be to increase Keppra tp 2,000 twice daily.

## 2022-06-23 NOTE — Progress Notes (Signed)
   Baclofen Pump Interrogation, Programming, Refill  Date of Service: 07/14/2022     Patient Name: Scott Bean      MRN: [id number]      Date of Birth: Dec 18, 1990 Primary Care Physician: Raliegh IpGottschalk, Ashly M, DO  Provider: Lorenz CoasterStephanie Javares Kaufhold MD MPH   HPI: Mother reports no concerns since last appointment.    Indications:      Time Out::  Done immediately prior to procedure  Description:  The patient was placed in the supine position. The pump was interrogated and found to have a calculated residual volume of: 1.2 ml. There was no redness or edema noted at the pump site. The pump reservoir site was prepped with betadine and draped using sterile technique per protocol. The pump was accessed on the first attempt using the 22 gauge needle provided in the refill kit. The residual baclofen was withdrawn and measured to be: 6 ml. The pump reservoir was refilled with 40ml of baclofen 56100mcg/ml  over 2 minute using sterile technique, deaccessed and the skin cleaned. The pump was reprogrammed with new reservoir amount and the daily rate was 463.2306mcg  LOT no. for baclofen: 2144-118 Expiration FAOZ:3086-57date:2026-04  [place] Alarm Date: 08/02/22 Return day for next refill 07/31/22  IT Baclofen infusion rate:19.5029mcg/hr   PLAN: Return day for next refill scheduled Mother ready to wean Tegretol given lack of seizures and sun sensitivity.  Continue Keppra.  Consider increasing if patient has seizures off Tegretol.  Plan to transition to 2,06700mcg/ml at next visit.   Lorenz CoasterStephanie Dylana Shaw MD MPH [facility], Neurodevelopment [facility]  [address], [place], [place] [address] Phone: [phone]

## 2022-07-07 ENCOUNTER — Encounter (INDEPENDENT_AMBULATORY_CARE_PROVIDER_SITE_OTHER): Payer: Self-pay | Admitting: Pediatrics

## 2022-07-07 ENCOUNTER — Other Ambulatory Visit (INDEPENDENT_AMBULATORY_CARE_PROVIDER_SITE_OTHER): Payer: Self-pay | Admitting: Family

## 2022-07-07 DIAGNOSIS — G40309 Generalized idiopathic epilepsy and epileptic syndromes, not intractable, without status epilepticus: Secondary | ICD-10-CM

## 2022-07-08 ENCOUNTER — Other Ambulatory Visit (INDEPENDENT_AMBULATORY_CARE_PROVIDER_SITE_OTHER): Payer: Self-pay | Admitting: Pediatrics

## 2022-07-08 DIAGNOSIS — G40309 Generalized idiopathic epilepsy and epileptic syndromes, not intractable, without status epilepticus: Secondary | ICD-10-CM

## 2022-07-08 MED ORDER — LEVETIRACETAM 100 MG/ML PO SOLN: 1500.0000 mg | Freq: Two times a day (BID) | ORAL | 5 refills | Status: DC

## 2022-07-08 NOTE — Telephone Encounter (Signed)
The Tegretol Rx was faxed to [facility] at 12:02 PM today. The Levetiracetam was sent electronically to [facility] at this time. TG

## 2022-07-08 NOTE — Telephone Encounter (Signed)
  Name of who is calling:Anita   Caller's Relationship to Patient:mother   Best contact [place]  Provider they see:Dr. Rogers Blocker   Reason for call:mom stated that the Tegretol and the Keppra were sent to the wrong pharmacy and needed to be sent to [facility].      PRESCRIPTION REFILL ONLY  Name of prescription:Tegretol and [place]. [place], [place]

## 2022-07-09 ENCOUNTER — Telehealth: Payer: Self-pay

## 2022-07-09 ENCOUNTER — Encounter: Payer: Self-pay | Admitting: Physical Medicine & Rehabilitation

## 2022-07-09 DIAGNOSIS — S069X9S Unspecified intracranial injury with loss of consciousness of unspecified duration, sequela: Secondary | ICD-10-CM

## 2022-07-09 MED ORDER — METHYLPHENIDATE HCL 5 MG PO TABS: 5.0000 mg | ORAL_TABLET | Freq: Two times a day (BID) | ORAL | 0 refills | Status: DC

## 2022-07-09 NOTE — Telephone Encounter (Signed)
Rx sent in

## 2022-07-09 NOTE — Telephone Encounter (Signed)
Patient's mother notified.

## 2022-07-23 ENCOUNTER — Encounter: Payer: Medicaid Other | Attending: Physical Medicine & Rehabilitation | Admitting: Physical Medicine & Rehabilitation

## 2022-07-23 ENCOUNTER — Encounter: Payer: Self-pay | Admitting: Physical Medicine & Rehabilitation

## 2022-07-23 VITALS — BP 122/72 | HR 86 | Ht 79.0 in

## 2022-07-23 DIAGNOSIS — S069X9S Unspecified intracranial injury with loss of consciousness of unspecified duration, sequela: Secondary | ICD-10-CM | POA: Diagnosis present

## 2022-07-23 DIAGNOSIS — G8250 Quadriplegia, unspecified: Secondary | ICD-10-CM | POA: Diagnosis not present

## 2022-07-23 MED ORDER — METHYLPHENIDATE HCL 10 MG PO TABS: 10.0000 mg | ORAL_TABLET | Freq: Two times a day (BID) | ORAL | 0 refills | Status: DC

## 2022-07-23 NOTE — Patient Instructions (Signed)
ALWAYS FEEL FREE TO CALL OUR OFFICE WITH ANY PROBLEMS OR QUESTIONS ([phone])  **PLEASE NOTE** ALL MEDICATION REFILL REQUESTS (INCLUDING CONTROLLED SUBSTANCES) NEED TO BE MADE AT LEAST 7 DAYS PRIOR TO REFILL BEING DUE. ANY REFILL REQUESTS INSIDE THAT TIME FRAME MAY RESULT IN DELAYS IN RECEIVING YOUR PRESCRIPTION.                    

## 2022-07-23 NOTE — Progress Notes (Signed)
Subjective:    Patient ID: Scott Bean, male    DOB: 02-Nov-1990, 32 y.o.   MRN: [id number]  HPI  Scott Bean is back regarding his TBI.  I saw him for initial valuation last month.  We started him on Ritalin for his arousal.  Apparently this helped quite a bit as he is awake now through most of the day.  He actually seems to be attending to more things at home and even attempts to speak at times.  He did receive some new glasses and has been able to turn his head to maximize his field-of-view through the right.  They have been working on finding a new air mattress for him as the current ones they have been using have been breaking frequently.  They asked about some options today.  They are doing some stretching at home for his feet and his hands.  They were unable to really stretch them out enough to try one of the ball splints.  They are using towels rolled up inside of his hands as well as his other devices to help stretch his finger flexors.  His heel cord seem to be looser overall.  Mom asked if this could be due to Ritalin.  He remains on Tegretol and Keppra for seizure prophylaxis and potentially for mood as well.  Tegretol causes photosensitivity and rash.  Mom is interested in looking at other options.  His neurologist will be increasing the concentration of his baclofen to help spread out his refill visits.  He is still waiting on his communication board.  Pain Inventory Average Pain 0 Pain Right Now 0 My pain is  Patient not able to state  LOCATION OF PAIN  No pain. Patient not able to state.  BOWEL Number of stools per week: 1 to 2 per day Oral laxative use Yes   Enema or suppository use Yes    BLADDER Pads    Mobility use a wheelchair  Function disabled: date disabled 09/2004 I need assistance with the following:  feeding, dressing, bathing, toileting, and meal prep  Neuro/Psych bladder control problems No problems in this area trouble walking  Prior Studies Any  changes since last visit?  no  Physicians involved in your care Any changes since last visit?  no   Family History  Problem Relation Age of Onset   Lung cancer Maternal Grandmother        Died at 17   Liver cancer Maternal Grandmother    Heart Problems Maternal Grandfather        Died at 28   Seizures Neg Hx    Social History   Socioeconomic History   Marital status: Single    Spouse name: Not on file   Number of children: Not on file   Years of education: Not on file   Highest education level: Not on file  Occupational History   Not on file  Tobacco Use   Smoking status: Never   Smokeless tobacco: Never  Vaping Use   Vaping Use: Never used  Substance and Sexual Activity   Alcohol use: No   Drug use: No   Sexual activity: Never  Other Topics Concern   Not on file  Social History Narrative      Scott Bean graduated from [employer] in 2016.    He lives with his mother.   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  Opioid Risk Score:   Fall Risk Score:  `1  Depression screen [facility] 2/9     10/16/2020    3:15 PM 10/12/2019    1:57 PM  Depression screen PHQ 2/9  Decreased Interest 0 0  Down, Depressed, Hopeless 0 0  PHQ - 2 Score 0 0  Altered sleeping 0    Tired, decreased energy 0   Change in appetite 0   Feeling bad or failure about yourself  0   Trouble concentrating 0   Moving slowly or fidgety/restless 0   Suicidal thoughts 0   PHQ-9 Score 0   Difficult doing work/chores Not difficult at all     Review of Systems  Musculoskeletal:  Positive for gait problem.       TBI  Neurological:  Positive for speech difficulty.  All other systems reviewed and are negative.      Objective:   Physical Exam  General: No acute distress HEENT: NCAT, EOMI, oral membranes moist Cards: reg rate  Chest: normal effort Abdomen: Soft, NT, ND Skin: dry, intact Extremities: no edema Psych: pleasant and appropriate  Skin: Clean and intact without signs of breakdown Neuro: Patient is in reclining wheelchair.  He is alert and makes eye contact.  He appears more attentive today and makes eye contact more frequently.  He did not speak today but seem to laugh and react to some of the conversation.  He continues to have significant left sided visual field loss but turned to the left to accommodate this.  He has significant finger and wrist contractures of both upper extremities right more than left.  Ankles and knees were fairly easy to reduce today.  He has clonus still at both ankles. 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   Chronic aphasia/apraxia/dysarthria       Plan: Mom/dad continue with excellent care.  We discussed air mattress today and is probably worthwhile to invest in some higher quality mattresses that do not break as often potentially. Maintain heel cord stretching. Night splints are an option. Towel rolls, hand splinting to stretch finger flexors/wrists.  Communication board has been discussed with SLP---still waiting on this -he seems to be engaging more .  -music based activities also should be  considered -increase ritalin to 10 mg daily, up to bid after one week             -may help his initiation and language as well.  He certainly seems to engage with his environment more..              -If the increase to 10 mg causes any types of adverse effects including agitation they  can we have him back down to the 5 mg dose.   4.  Baclofen pump per Dr. Artis Flock. Increasing baclofen concentration to extend visits.  5.  Seizure prophylaxis: Family will discuss options with Dr. Artis Flock   Twenty minutes of face to face patient care time were spent during this visit. All questions were encouraged and answered.  Follow up with me in 4 mos .

## 2022-07-31 ENCOUNTER — Encounter: Payer: Self-pay | Admitting: Family Medicine

## 2022-07-31 ENCOUNTER — Encounter (INDEPENDENT_AMBULATORY_CARE_PROVIDER_SITE_OTHER): Payer: Self-pay | Admitting: Pediatrics

## 2022-07-31 ENCOUNTER — Ambulatory Visit (INDEPENDENT_AMBULATORY_CARE_PROVIDER_SITE_OTHER): Payer: Medicaid Other | Admitting: Pediatrics

## 2022-07-31 DIAGNOSIS — G808 Other cerebral palsy: Secondary | ICD-10-CM | POA: Diagnosis not present

## 2022-07-31 DIAGNOSIS — G40309 Generalized idiopathic epilepsy and epileptic syndromes, not intractable, without status epilepticus: Secondary | ICD-10-CM

## 2022-07-31 MED ORDER — CARBAMAZEPINE 100 MG/5ML PO SUSP: ORAL | 5 refills | Status: DC

## 2022-07-31 NOTE — Progress Notes (Signed)
   Baclofen Pump Interrogation, Programming, Refill  Date of Service: 07/31/2022     Patient Name: Scott Bean      MRN: [id number]      Date of Birth: 1990/10/05 Primary Care Physician: Raliegh Ip, DO  Provider: Lorenz Coaster MD MPH   HPI: Mother reports no concerns since last appointment.  We attempted to decrease Tegretol, however [name] started having events again so wean was stopped. He is now at 15ml every 6 hours  We are changing Baclofen concentration from 52mcg/mL to 2,080mcg/mL today to decrease need for return visits.    Indications:    Congenital quadriplegic cerebral palsy  Time Out:: Done immediately prior to procedure  Description:  The patient was placed in the supine position. The pump was interrogated and found to have a calculated residual volume of: 4.9 ml. There was no redness or edema noted at the pump site. Patient has chronic skin irritation around pump site. The pump reservoir site was prepped with betadine and draped using sterile technique per protocol. The pump was accessed on the first attempt using the 22 gauge needle provided in the refill kit. The residual baclofen was withdrawn and measured to be: 10 ml. The pump reservoir was refilled with 40 of baclofen 2034mcg/ml  over 2 minute using sterile technique, deaccessed and the skin cleaned. The pump was reprogrammed to represent change in concentration  LOT no. for baclofen: 2163-171 Expiration date:2026-08    PLAN: Return day for next refill 01/08/2023.  This was made today Continue Keppra and Tegretol.  New prescription for decreased dose of Tegretol sent for accuracy.  Keppra has refills.  Counseled mother on baclofen withdrawal and baclofen overdose symptoms given the change today.  Counseled to give baclofen  tab x1 for signs of withdrawal and call if he has any abnormal symptoms in general to confirm they are not related to baclofen.  Based on calculations, the change to increase  concentration should occur tonight, so advised to follow closely tonight.    I spent an additional 15 minutes reprogramming pump with [employer] rep and discussing precautions with parent today, outside of procedure time.    Lorenz Coaster MD MPH [facility] Neurology, Neurodevelopment and [facility]  [address] [place], [place], [place] [address] Phone: [phone]

## 2022-08-04 NOTE — Telephone Encounter (Signed)
I completed a form a couple of weeks ago.  Don't see it scanned under media.  Is it on your desk by any chance?

## 2022-08-07 NOTE — Progress Notes (Signed)
This is a Pediatric Specialist E-Visit consult/follow up provided via My Chart Video Visit (Caregility). Scott Bean and their parent/guardian Arletha Pili consented to an E-Visit consult today.  Is the patient present for the video visit? Yes Location of patient: [name] is at home. Is the patient located in the state of [place]? Yes If not in the state of [place], is the location temporary? Ex. vacation or at college? Yes Location of provider: Milana Obey, MD is at [facility] (Neurology). Patient was referred by Raliegh Ip, DO   This visit was done via VIDEO   Medical Nutrition Therapy - Progress Note Appt start time: 9:30 AM   Appt end time: 9:45 AM Reason for referral: Gtube dependence Referring provider: Dr. Artis Flock - Neuro Pertinent medical hx: Spastic hemiplegia, epilepsy, obstructive hydrocephalus, TBI with loss of consciousness, cognitive decline, h/o gastrointestinal disease, transient alteration of awareness, urinary and fecal incontinence, high risk for pressure injury of skin, dysphagia, +gtube  Assessment: Food allergies: none Pertinent Medications: see medication list Vitamins/Supplements: none Pertinent labs:  (5/31) TSH, Free T4, CBC - WNL  (5/31) Lipid Panel: Total Cholesterol - 217 (high), TG - 245 (high), HDL - 59 (WNL), VLDL - 42 (high), LDL - 116 (high)  No anthropometrics taken on 5/16 due to virtual appointment. Most recent anthropometrics 4/17 were used to determine dietary needs.   (4/17) Anthropometrics: Ht: 200 cm   Wt: 97.7 kg (215 lb) BMI: 24.2 kg IBW based on Hamwi Equation: 97 kg (+/- 10%)  02/24/22 Wt: 103.6 kg (*mom reports weight was 226#*) 01/13/22 Wt: 102 kg (226 # 6.4 oz) 12/05/21 Wt: 104.2 kg (229 # 12.8 oz) 10/24/21 Wt: 105.4 kg (231 # 9.6 oz)  UBW: ~200 #    Estimated minimum caloric needs: 1350 kcal/day (based on stable weight trends on current regimen) Estimated minimum protein needs: 0.8  g/kg/day (DRI) Estimated minimum fluid needs: 2000-2400 mL/day (25 kcal/kg)   Primary concerns today: Follow-up given pt with gtube dependence. Mom accompanied pt to appt virtual today.   Dietary Intake Hx: DME: [employer]   Formula: [name] Standard 1.4 and [name]  Current regimen:  Day feeds: 325 mL (1 carton) via gravity feeds x 3 feeds  @ 8 AM, 1 PM, 6 PM (mom will give 1/2 then will wait 30 minutes then other 1/2 of carton) Overnight feeds: none Total Volume: 1 cartons [name], 2 carton [employer] Standard 1.4   FWF: 160 mL with each feed x3, 60 mL after each feed and after medications x4, 120 mL diet cranberry juice @ 6 AM, 180 mL water with Miralax (1200 mL total) Nutrition Supplement: 2 Prosource daily  Previous Supplements Tried: 2kcal HN (gas/bloating)   Feed positioning/location: 15-80 degree incline PO foods: none PO beverages: none   Notes: Mom notes that [name] has been on [employer] for about a year. Prior to [employer] he was on a 2 kcal HN formula for many years. [name] was switched to [employer] due to gas and bloating, mom notes improvement with tolerance given slow weight loss. [name] was previously consuming some foods PO, however developed aspiration pneumonia and currently consumes nutrition only via gtube. Mom reports [name] is doing great on current regimen, mom's only concern is prevent too much weight loss.   GI: daily, improving and thickening up - Miralax daily  GU: "a lot" - very clear   Physical Activity: wheelchair bound  Estimated caloric  intake: 1355 kcal/day - meets 100% of estimated needs Estimated protein intake: 0.9 g/kg/day - meets 112% of estimated needs Estimated fluid intake: 1922 mL/day - meets 80-96% of estimated needs  Micronutrient Intake  Vitamin A 1440 mcg  Vitamin C 150 mg  Vitamin D 21 mcg  Vitamin E 22.7 mg  Vitamin K 110 mcg  Vitamin B1 (thiamin) 1.7 mg  Vitamin B2 (riboflavin) 1.8 mg  Vitamin  B3 (niacin) 21 mg  Vitamin B5 (pantothenic acid) 11 mg  Vitamin B6 2.1 mg  Vitamin B7 (biotin) 60 mcg  Vitamin B9 (folate) 838 mcg  Vitamin B12 6.3 mcg  Choline 515 mg  Calcium 1050 mg  Chromium 76 mcg  Copper 2100 mcg  Fluoride 0 mg  Iodine 170 mcg  Iron 19 mg  Magnesium 394 mg  Manganese 2.4 mg  Molybdenum 135 mcg  Phosphorous 900 mg  Selenium 73.5 mcg  Zinc 18.5 mg  Potassium 2000 mg  Sodium 1005 mg  Chloride 1387 mg  Fiber 11 g   Nutrition Diagnosis: (7/24) Inadequate oral intake related to dysphagia and NPO status as evidenced by pt dependent on Gtube feedings to meet nutritional needs.  Intervention: Discussed pt's weight trends and current regimen in detail. Discussed recommendations below. All questions answered, family in agreement with plan.   Nutrition Recommendations sent via MyChart message: - Let's increase Prosource to 3 times per day to prevent further weight loss or muscle breakdown.   This new regimen will provide: 1415 kcal/day, 1.0 g protein/kg/day, 1922 mL/day.  Teach back method used.  Monitoring/Evaluation: Continue to Monitor: - Weight trends  - TF tolerance  - Need for Peptide formula  Follow-up August 20th @ 1:30 PM .  Total time spent in counseling: 15 minutes.

## 2022-08-21 ENCOUNTER — Encounter (INDEPENDENT_AMBULATORY_CARE_PROVIDER_SITE_OTHER): Payer: Self-pay | Admitting: Dietician

## 2022-08-21 ENCOUNTER — Ambulatory Visit (INDEPENDENT_AMBULATORY_CARE_PROVIDER_SITE_OTHER): Payer: Medicaid Other | Admitting: Dietician

## 2022-08-21 VITALS — Wt 215.0 lb

## 2022-08-21 DIAGNOSIS — Z931 Gastrostomy status: Secondary | ICD-10-CM

## 2022-08-21 DIAGNOSIS — R638 Other symptoms and signs concerning food and fluid intake: Secondary | ICD-10-CM

## 2022-08-21 DIAGNOSIS — R1319 Other dysphagia: Secondary | ICD-10-CM

## 2022-08-21 DIAGNOSIS — R1312 Dysphagia, oropharyngeal phase: Secondary | ICD-10-CM

## 2022-08-21 DIAGNOSIS — R1310 Dysphagia, unspecified: Secondary | ICD-10-CM | POA: Diagnosis not present

## 2022-08-21 MED ORDER — NUTRITIONAL SUPPLEMENT PLUS PO LIQD: ORAL | 12 refills | Status: AC

## 2022-08-21 NOTE — Progress Notes (Signed)
RD faxed updated orders for 3 prosource daily and clinic notes to [phone].

## 2022-08-21 NOTE — Patient Instructions (Signed)
Nutrition Recommendations sent via MyChart message: - Let's increase Prosource to 3 times per day to prevent further weight loss or muscle breakdown.

## 2022-08-27 ENCOUNTER — Encounter: Payer: Self-pay | Admitting: Physical Medicine & Rehabilitation

## 2022-09-03 ENCOUNTER — Telehealth: Payer: Self-pay | Admitting: *Deleted

## 2022-09-03 DIAGNOSIS — R4701 Aphasia: Secondary | ICD-10-CM

## 2022-09-03 DIAGNOSIS — F068 Other specified mental disorders due to known physiological condition: Secondary | ICD-10-CM

## 2022-09-03 NOTE — Telephone Encounter (Signed)
Referral made to SLP/Jessica.

## 2022-09-03 NOTE — Telephone Encounter (Signed)
Patient's mother would like a new referral placed for additional therapy (Neuro therapy and rehab). She says Shanda Bumps informed that he would need a new referral.

## 2022-09-03 NOTE — Telephone Encounter (Signed)
Patient's mother notified lvm

## 2022-09-09 ENCOUNTER — Encounter: Payer: Self-pay | Admitting: Speech Pathology

## 2022-09-09 ENCOUNTER — Ambulatory Visit: Payer: Medicaid Other | Attending: Physical Medicine & Rehabilitation | Admitting: Speech Pathology

## 2022-09-09 ENCOUNTER — Other Ambulatory Visit: Payer: Self-pay

## 2022-09-09 DIAGNOSIS — F801 Expressive language disorder: Secondary | ICD-10-CM

## 2022-09-09 DIAGNOSIS — F068 Other specified mental disorders due to known physiological condition: Secondary | ICD-10-CM | POA: Insufficient documentation

## 2022-09-09 DIAGNOSIS — F802 Mixed receptive-expressive language disorder: Secondary | ICD-10-CM | POA: Diagnosis present

## 2022-09-09 DIAGNOSIS — R41841 Cognitive communication deficit: Secondary | ICD-10-CM | POA: Diagnosis present

## 2022-09-09 NOTE — Patient Instructions (Addendum)
[email or web address]  [email or web address]    Change grid size:  Click the gear in the top right corner.  Select page set  Select grid size.  Click done.    To Add a button  Click settings (gear)  Find empty grey slot or empty button with (+)  Click  Label:--what do you want the button to be called.  Click Symbol and search to find and select.  Click done.

## 2022-09-09 NOTE — Therapy (Signed)
OUTPATIENT SPEECH LANGUAGE PATHOLOGY EVALUATION   Patient Name: Scott Bean MRN: [id number] DOB:09/18/1990, 32 y.o., male Today's Date: 09/09/2022  PCP: Scott Ip, DO REFERRING PROVIDER: Ranelle Oyster, MD  END OF SESSION:  End of Session - 09/09/22 1107     Visit Number 1    Number of Visits 9    Date for SLP Re-Evaluation 11/04/22    Authorization Type Medicaid    SLP Start Time 1015    SLP Stop Time  1100    SLP Time Calculation (min) 45 min    Activity Tolerance Patient tolerated treatment well             Past Medical History:  Diagnosis Date   Bacteremia 12/10/2011   Cellulitis 12/27/2012   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, implant, and graft 02/06/2013   PNA (pneumonia) 12/09/2011   Traumatic brain injury [facility])    Past Surgical History:  Procedure Laterality Date   decompressive craniotomy     implanation of intrathecal baclofen pump     PEG TUBE PLACEMENT     reimplantation of intrathecal baclofen pump     VENTRICULOPERITONEAL SHUNT     Patient Active Problem List   Diagnosis Date Noted   Spastic tetraplegia (HCC) 04/30/2022   Dysphagia, oropharyngeal 04/30/2022   S/P percutaneous endoscopic gastrostomy (PEG) tube placement (HCC) 09/04/2021   Full incontinence of feces 09/04/2021   Urinary incontinence without sensory awareness 09/04/2021   At high risk for pressure injury of skin 09/04/2021   Irritation around percutaneous endoscopic gastrostomy (PEG) tube site (HCC) 08/30/2020   Presence of intrathecal baclofen pump 08/02/2019   Transient alteration of awareness 03/18/2019   Essential (primary) hypertension 11/12/2017   Gastroesophageal reflux disease 11/12/2017   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   Encounter for long-term (current) use of other medications 08/10/2012   H/O gastrointestinal disease 05/15/2011   Allergic state 05/15/2011   Obstructive hydrocephalus (HCC) 05/14/2011    Closed skull fracture with intracranial injury, with loss of consciousness (HCC) 05/14/2011   Cognitive deficit as late effect of traumatic brain injury (HCC) 05/14/2011   Hydrocephalus (HCC) 05/14/2011   Traumatic brain injury with loss of consciousness (HCC) 05/14/2011    ONSET DATE: 09/03/22  REFERRING DIAG:  F62.1,H08.6V7Q (ICD-10-CM) - Cognitive deficit as late effect of traumatic brain injury (HCC)  R47.01 (ICD-10-CM) - Aphasia    THERAPY DIAG:  Cognitive communication deficit  Expressive language disorder  Receptive language disorder  Rationale for Evaluation and Treatment: Habilitation  SUBJECTIVE:   SUBJECTIVE STATEMENT: "I don't know what I'm doing."--Mother  Pt accompanied by: family member  PERTINENT HISTORY: 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   Prior speech therapy course 2023-resulting in procurement of eye gaze device.     PAIN:  Are you having pain?  No overt signs or symptoms of pain.  FALLS: Has patient fallen in last 6 months?  Comment: N/A  LIVING ENVIRONMENT: Lives with: lives with their family Lives in: House/apartment  PLOF:  Level of assistance: Needed assistance with ADLs, Needed assistance with IADLS   PATIENT GOALS: Increased independent in utilizing device.   OBJECTIVE:    COGNITION: Overall cognitive status: Impaired Areas of impairment:  Global impairment secondary to TBI 2006 Functional deficits: Total A by family for care needs.   RECEPTIVE LANGUAGE Overall: Impaired: simple YES/NO questions: Impaired: simple Following directions: Not tested Conversation:  Unable to assess due  to severity of impairment.   EXPRESSIVE LANGUAGE: Level of generative/spontaneous verbalization: none Non-verbal means of communication: Tobii Dynavox TD I-13 Automatic speech: Grunting, appears unrelated to expressive communication Comments: limited  expressive language, emerging skill in use of SGD during structured tasks; mother reports use of eye blinks occasionally effective for affirming/negating  SPEECH GENERATING DEVICE:  Mode of access; eye gaze, wheel chair mounted.  Communication needs: family, medical providers, community.  Current page set up: 4x4 screen.  Selection accuracy: unable to assess, as pt attends without all hardware requires for wheelchair mount  Caregiver comments: Caregiver endorses creating functional layout for AAC device.     STANDARDIZED ASSESSMENTS: Deferred d/t nature of impairments   PATIENT REPORTED OUTCOME MEASURES (PROM): Deferred    TODAY'S TREATMENT:                                                                                                                                         DATE:   09/09/22: Goals reviewed for patient. Mother wants Scott Batten to increase his independence, and to make requests. SLP led through adding words on the device. Tasks completed today: changing grid size. Goal to set up pages/words and create the best layout in order for patient to increase in independent communication. Goals discussed, all questions answered to patient satisfaction.   PATIENT EDUCATION: Education details: See above.  Person educated: Patient Education method: Medical illustrator Education comprehension: verbalized understanding and returned demonstration   GOALS: Goals reviewed with patient? Yes  SHORT TERM GOALS: Target date: 10/07/2022  Pt will select appropriate icon to engage in 1 conversational turn or make simple request during structured practice with 50% accuracy given mod-A Baseline: 40% accuracy at last session 03/28/23 Goal status: INITIAL  2.  Pt's caregiver will demonstrate adding, deleting, moving, editing buttons with min-A over 2 sessions Baseline: mother can move buttons Goal status: INITIAL  3.  Pt's mother will adjust device settings (brightness, volume) given  min-A over 2 sessions Baseline: mother unable to do Goal status: INITIAL   LONG TERM GOALS: Target date: 11/04/2022  Pt's caregiver will report pt using SGD to make x3 communicative attempts, outside of structured practice with mod-A over 1 week period Baseline: only using in structured practice Goal status: INITIAL  2.  Pt will complete HEP 5/7 days over 2 week period Baseline: intermittent HEP completion Goal status: INITIAL  3.  Pt's caregiver will report improved confidence in ability to modify pt's device to optimize it's use in home and community Baseline: Mother reports competency in moving buttons, turning on/off device Goal status: INITIAL   ASSESSMENT:  CLINICAL IMPRESSION: Patient is a 32 y.o. M who was seen today for AAC evaluation, with primary caregivers present. Pt presents with profound global language impairment with emerging skills in use of eye gaze SGD. Pt's mother is requesting additional help with eye gaze AAC device: implementation and modification. Mother  is able to move icons, turn device on/off, start snap program. Requires A from niece to make modifications to device. Current employment of device is limited to structured practice with mom, who reports redundancy and lack of current options may be impacting patient's motivation to participate. Requests A from SLP in making device more dynamic and functional for patient. Skilled ST is indicated to address caregiver education to ensure primary caregiver is able to IND modify device on ongoing basis to support growth of expressive language skills and enhance pt's engagement with device to ensure optimization of it's usage. Prior therapy course was illustrative of pt's ability to navigate device given support of communication partner.   OBJECTIVE IMPAIRMENTS: include expressive language and receptive language. These impairments are limiting patient from effectively communicating at home and in community. Factors  affecting potential to achieve goals and functional outcome are ability to learn/carryover information, co-morbidities, cooperation/participation level, and severity of impairments. Patient will benefit from skilled SLP services to address above impairments and improve overall function.  REHAB POTENTIAL: Good to meet established goals  PLAN:  SLP FREQUENCY: 1x/week  SLP DURATION: 8 weeks  PLANNED INTERVENTIONS: Internal/external aids, Multimodal communication approach, and Patient/family education  Check all possible CPT codes: 16109, [phone]   Check all conditions that are expected to impact treatment: Cognitive Impairment or Intellectual disability and Neurological condition and/or seizures   If treatment provided at initial evaluation, no treatment charged due to lack of authorization.    Maia Breslow, CCC-SLP 09/09/2022, 11:08 AM

## 2022-09-12 ENCOUNTER — Telehealth: Payer: Self-pay | Admitting: *Deleted

## 2022-09-12 DIAGNOSIS — S069X9S Unspecified intracranial injury with loss of consciousness of unspecified duration, sequela: Secondary | ICD-10-CM

## 2022-09-12 MED ORDER — METHYLPHENIDATE HCL 10 MG PO TABS
10.0000 mg | ORAL_TABLET | Freq: Two times a day (BID) | ORAL | 0 refills | Status: DC
Start: 2022-09-12 — End: 2022-09-15

## 2022-09-12 MED ORDER — METHYLPHENIDATE HCL 10 MG PO TABS: 10.0000 mg | ORAL_TABLET | Freq: Two times a day (BID) | ORAL | 0 refills | Status: DC

## 2022-09-12 NOTE — Telephone Encounter (Signed)
Requesting refill Methylphenidate 10 mg

## 2022-09-12 NOTE — Telephone Encounter (Signed)
[facility] does not have Methylphenidate. Pt's mother would like rx sent to [facility]. He is not out she is just getting request in. She was made aware you would not respond until Monday.

## 2022-09-12 NOTE — Telephone Encounter (Signed)
Ritalin filled for this month AND July

## 2022-09-15 MED ORDER — METHYLPHENIDATE HCL 10 MG PO TABS: 10.0000 mg | ORAL_TABLET | Freq: Two times a day (BID) | ORAL | 0 refills | Status: DC

## 2022-09-15 NOTE — Telephone Encounter (Signed)
Got it and done.  Went ahead and changed this month's and next month's rx to [facility]

## 2022-09-16 ENCOUNTER — Encounter: Payer: Self-pay | Admitting: Family Medicine

## 2022-09-16 ENCOUNTER — Ambulatory Visit: Payer: Medicaid Other | Admitting: Speech Pathology

## 2022-09-16 ENCOUNTER — Ambulatory Visit (INDEPENDENT_AMBULATORY_CARE_PROVIDER_SITE_OTHER): Payer: Medicaid Other | Admitting: Family Medicine

## 2022-09-16 ENCOUNTER — Telehealth (INDEPENDENT_AMBULATORY_CARE_PROVIDER_SITE_OTHER): Payer: Self-pay | Admitting: Pediatrics

## 2022-09-16 VITALS — BP 121/79 | HR 81 | Temp 98.7°F | Ht 79.0 in | Wt 218.0 lb

## 2022-09-16 DIAGNOSIS — Z0001 Encounter for general adult medical examination with abnormal findings: Secondary | ICD-10-CM

## 2022-09-16 DIAGNOSIS — Z0000 Encounter for general adult medical examination without abnormal findings: Secondary | ICD-10-CM

## 2022-09-16 DIAGNOSIS — Z931 Gastrostomy status: Secondary | ICD-10-CM

## 2022-09-16 DIAGNOSIS — R7989 Other specified abnormal findings of blood chemistry: Secondary | ICD-10-CM

## 2022-09-16 DIAGNOSIS — R159 Full incontinence of feces: Secondary | ICD-10-CM

## 2022-09-16 DIAGNOSIS — G8110 Spastic hemiplegia affecting unspecified side: Secondary | ICD-10-CM | POA: Diagnosis not present

## 2022-09-16 DIAGNOSIS — N3942 Incontinence without sensory awareness: Secondary | ICD-10-CM | POA: Diagnosis not present

## 2022-09-16 DIAGNOSIS — G40309 Generalized idiopathic epilepsy and epileptic syndromes, not intractable, without status epilepticus: Secondary | ICD-10-CM

## 2022-09-16 DIAGNOSIS — Z9189 Other specified personal risk factors, not elsewhere classified: Secondary | ICD-10-CM

## 2022-09-16 NOTE — Progress Notes (Signed)
Scott Bean is a 32 y.o. male presents to office today for annual physical exam examination.    Concerns today include: 1. None.  He is now working with Dr. Caren Bean who is a brain injury specialist.  He was started on methylphenidate and he seems to be much more alert during the daytime.  They are working with a communication device in efforts to get him familiarized with the machine.  She is hopeful that he will be able to regain some type of communication.  He continues to see the his neurologist Dr. Sheppard Bean every 6 months and he has been stable on current antiseizure medications.  She denies any pressure ulcers.  He continues to tolerate tube feeds as directed.  He saw the specialist about his G-tube and they essentially told him that the skin around the G-tube would look inflamed chronically because of the nature of the tube.  He certainly has less leaking however now that he is healed from the tube placement.  Occupation: disabled, Marital status: single, Substance use: none Diet: per tube feeds, Exercise: none Refills needed today: none Immunizations needed: Immunization History  Administered Date(s) Administered   Influenza,inj,Quad PF,6+ Mos 01/11/2013   Moderna Sars-Covid-2 Vaccination 06/22/2019, 07/19/2019     Past Medical History:  Diagnosis Date   Bacteremia 12/10/2011   Cellulitis 12/27/2012   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, implant, and graft 02/06/2013   PNA (pneumonia) 12/09/2011   Traumatic brain injury Scott Bean)    Social History   Socioeconomic History   Marital status: Single    Spouse name: Not on file   Number of children: Not on file   Years of education: Not on file   Highest education level: Not on file  Occupational History   Not on file  Tobacco Use   Smoking status: Never   Smokeless tobacco: Never  Vaping Use   Vaping Use: Never used  Substance and Sexual Activity   Alcohol use: No   Drug use: No   Sexual  activity: Never  Other Topics Concern   Not on file  Social History Narrative      Scott Bean graduated from [employer] in 2016.    He lives with his mother.   Social Determinants of Health   Financial Resource Strain: Not on file  Food Insecurity: Not on file  Transportation Needs: Not on file  Physical Activity: Not on file  Stress: Not on file  Social Connections: Not on file  Intimate Partner Violence: Not on file   Past Surgical History:  Procedure Laterality Date   decompressive craniotomy     implanation of intrathecal baclofen pump     PEG TUBE PLACEMENT     reimplantation of intrathecal baclofen pump     VENTRICULOPERITONEAL SHUNT     Family History  Problem Relation Age of Onset   Lung cancer Maternal Grandmother        Died at 61   Liver cancer Maternal Grandmother    Heart Problems Maternal Grandfather        Died at 46   Seizures Neg Hx     Current Outpatient Medications:    Baclofen 5 MG TABS, Crush 2 tablets and dissolve in liquid.  Give per gtube TID PRN for baclofen pump failure, Disp: 10 tablet, Rfl: 0   carBAMazepine (TEGRETOL) 100 MG/5ML suspension, TAKE 15 MLS EVERY 6 HOURS, Disp: 1800 mL, Rfl: 5   clotrimazole-betamethasone (LOTRISONE) cream, APPLY TO THE AFFECTED  AREA(S) DAILY AS NEEDED, Disp: 45 g, Rfl: 1   fluticasone (FLONASE) 50 MCG/ACT nasal spray, USE 2 SPRAYS IN EACH NOSTRIL ONCE DAILY., Disp: 16 g, Rfl: 5   Lactobacillus Rhamnosus, GG, (RA PROBIOTIC DIGESTIVE CARE) CAPS, Take 1 capsule by mouth daily., Disp: , Rfl:    levETIRAcetam (KEPPRA) 100 MG/ML solution, Take 15 mLs (1,500 mg total) by mouth 2 (two) times daily., Disp: 930 mL, Rfl: 5   methylphenidate (RITALIN) 10 MG tablet, Take 1 tablet (10 mg total) by mouth 2 (two) times daily with breakfast and lunch. At 0700 and 1200 daily, Disp: 60 tablet, Rfl: 0   methylphenidate (RITALIN) 10 MG tablet, Take 1 tablet (10 mg total) by mouth 2 (two) times daily with breakfast and lunch.,  Disp: 60 tablet, Rfl: 0   Nutritional Supplements (NUTRITIONAL SUPPLEMENT PLUS) LIQD, 1 Scott Bean Standard 1.0 given daily by gtube through gravity feeds., Disp: 10075 mL, Rfl: 12   Nutritional Supplements (NUTRITIONAL SUPPLEMENT PLUS) LIQD, 2 Scott Bean Standard 1.4 given by gtube daily through gravity feeds, Disp: 20150 mL, Rfl: 12   Nutritional Supplements (NUTRITIONAL SUPPLEMENT PLUS) LIQD, 3 Scott Bean (30 mL each) given via gtube daily. Mix each pouch with at least 60 mL of water., Disp: 2790 mL, Rfl: 12   polyethylene glycol powder (GLYCOLAX/MIRALAX) 17 GM/SCOOP powder, 17 g by Per G Tube route daily as needed for Constipation., Disp: , Rfl:    RESTASIS 0.05 % ophthalmic emulsion, Place 1 drop into both eyes 2 (two) times daily., Disp: , Rfl:    tacrolimus (PROTOPIC) 0.1 % ointment, Apply topically., Disp: , Rfl:   Allergies  Allergen Reactions   Clindamycin/Lincomycin     Ointment caused increased redness     ROS: Review of Systems A comprehensive review of systems was negative except for: Eyes: positive for visual disturbance Gastrointestinal: positive for incontinence Genitourinary: positive for urinary incontinence Musculoskeletal: positive for hand contractures, dependence on ADLs,IADLS Neurological: positive for coordination problems, gait problems, seizures, and speech problems    Physical exam BP 121/79   Pulse 81   Temp 98.7 F (37.1 C)   Ht 6\' 7"  (2.007 m)   Wt 218 lb (98.9 kg)   SpO2 97%   BMI 24.56 kg/m  General appearance: alert, awake, arrives in wheelchair Head:  healed previous trauma  Eyes:  Exotropia. Ears: normal TM's and external ear canals both ears Nose: Nares normal. Septum midline. Mucosa normal. No drainage or sinus tenderness. Throat: lips, mucosa, and tongue normal; teeth and gums normal Neck: no adenopathy, supple, symmetrical, trachea midline, and thyroid not enlarged, symmetric, no tenderness/mass/nodules Back:  Limited exam secondary to  paraplegia but no tenderness palpation to the cervical spine.  He moves his neck left and right independently Lungs:  Coarse breath sounds throughout with normal work of breathing on room air Chest wall: no tenderness Heart: regular rate and rhythm, S1, S2 normal, no murmur, click, rub or gallop Abdomen:  Soft.  He has inflammation surrounding the G-tube on the left but no evidence of secondary infection or leakage Extremities:  Poor muscle tone.  Contractures of bilateral hands present without any skin breakdown on the palmar aspects of the hands.  Extremities are cool Pulses:  +2 radial pulses Skin:  Skin around G-tube as above Lymph nodes: Cervical, supraclavicular, and axillary nodes normal. Neurologic: Occasionally says Momma.    Assessment/ Plan: [name] here for annual physical exam.   Annual physical exam  Spastic hemiplegia affecting nondominant side (HCC) -  Plan: Lipid panel, CMP14+EGFR, CBC, VITAMIN D 25 Hydroxy (Vit-D Deficiency, Fractures)  Spastic hemiplegia affecting dominant side (HCC) - Plan: Lipid panel, CMP14+EGFR, CBC, VITAMIN D 25 Hydroxy (Vit-D Deficiency, Fractures)  Urinary incontinence without sensory awareness - Plan: CMP14+EGFR, CBC, VITAMIN D 25 Hydroxy (Vit-D Deficiency, Fractures)  Full incontinence of feces - Plan: CMP14+EGFR, CBC, VITAMIN D 25 Hydroxy (Vit-D Deficiency, Fractures)  At high risk for pressure injury of skin - Plan: CMP14+EGFR, CBC, VITAMIN D 25 Hydroxy (Vit-D Deficiency, Fractures)  Generalized convulsive epilepsy (HCC) - Plan: CMP14+EGFR, CBC, VITAMIN D 25 Hydroxy (Vit-D Deficiency, Fractures)  S/P percutaneous endoscopic gastrostomy (PEG) tube placement (HCC) - Plan: CMP14+EGFR, CBC, VITAMIN D 25 Hydroxy (Vit-D Deficiency, Fractures)  Low serum T4 level - Plan: TSH, T4, free  Continue total care.  Continue incontinence supplies for fecal and urinary incontinence.  Likely no pressure injuries of the skin today.  Will check  vitamin D level given incapacity, renal function, albumin level.  Check thyroid levels given history of low T4 in the past  Continue to follow-up with brain specialist and neurologist as directed  Counseled on healthy lifestyle choices, including diet (rich in fruits, vegetables and lean meats and low in salt and simple carbohydrates) and exercise (at least 30 minutes of moderate physical activity daily).  Patient to follow up in 1 year for annual exam or sooner if needed.  Alice Burnside M. Nadine Counts, DO

## 2022-09-16 NOTE — Telephone Encounter (Signed)
Who's calling (name and relationship to patient) : Arletha Pili; mom   Best contact number: [phone]  Provider they see: Dr. Artis Flock  Reason for call:  Mom called in wanting to know if [name] needs blood work done, and if so can the orders be sent to [facility]. Provider name is Doylene Canard, MD.

## 2022-09-17 ENCOUNTER — Other Ambulatory Visit: Payer: Medicaid Other

## 2022-09-17 DIAGNOSIS — G40309 Generalized idiopathic epilepsy and epileptic syndromes, not intractable, without status epilepticus: Secondary | ICD-10-CM

## 2022-09-17 DIAGNOSIS — N3942 Incontinence without sensory awareness: Secondary | ICD-10-CM

## 2022-09-17 DIAGNOSIS — Z9189 Other specified personal risk factors, not elsewhere classified: Secondary | ICD-10-CM

## 2022-09-17 DIAGNOSIS — R159 Full incontinence of feces: Secondary | ICD-10-CM

## 2022-09-17 DIAGNOSIS — G8110 Spastic hemiplegia affecting unspecified side: Secondary | ICD-10-CM

## 2022-09-17 DIAGNOSIS — Z931 Gastrostomy status: Secondary | ICD-10-CM

## 2022-09-17 DIAGNOSIS — R7989 Other specified abnormal findings of blood chemistry: Secondary | ICD-10-CM

## 2022-09-18 LAB — CBC
Hematocrit: 46.8 % (ref 37.5–51.0)
Hemoglobin: 16 g/dL (ref 13.0–17.7)
MCH: 31.6 pg (ref 26.6–33.0)
MCHC: 34.2 g/dL (ref 31.5–35.7)
MCV: 92 fL (ref 79–97)
Platelets: 270 10*3/uL (ref 150–450)
RBC: 5.07 x10E6/uL (ref 4.14–5.80)
RDW: 13.9 % (ref 11.6–15.4)
WBC: 5.9 10*3/uL (ref 3.4–10.8)

## 2022-09-18 LAB — LIPID PANEL
Chol/HDL Ratio: 2.8 ratio (ref 0.0–5.0)
Cholesterol, Total: 202 mg/dL — ABNORMAL HIGH (ref 100–199)
HDL: 72 mg/dL (ref >39)
LDL Chol Calc (NIH): 107 mg/dL — ABNORMAL HIGH (ref 0–99)
Triglycerides: 134 mg/dL (ref 0–149)
VLDL Cholesterol Cal: 23 mg/dL (ref 5–40)

## 2022-09-18 LAB — CMP14+EGFR
ALT: 31 IU/L (ref 0–44)
AST: 18 IU/L (ref 0–40)
Albumin/Globulin Ratio: 1.3
Albumin: 4.2 g/dL (ref 4.1–5.1)
Alkaline Phosphatase: 134 IU/L — ABNORMAL HIGH (ref 44–121)
BUN/Creatinine Ratio: 30 — ABNORMAL HIGH (ref 9–20)
BUN: 12 mg/dL (ref 6–20)
Bilirubin Total: 0.2 mg/dL (ref 0.0–1.2)
CO2: 26 mmol/L (ref 20–29)
Calcium: 9.3 mg/dL (ref 8.7–10.2)
Chloride: 96 mmol/L (ref 96–106)
Creatinine, Ser: 0.4 mg/dL — ABNORMAL LOW (ref 0.76–1.27)
Globulin, Total: 3.3 g/dL (ref 1.5–4.5)
Glucose: 79 mg/dL (ref 70–99)
Potassium: 4.3 mmol/L (ref 3.5–5.2)
Sodium: 138 mmol/L (ref 134–144)
Total Protein: 7.5 g/dL (ref 6.0–8.5)
eGFR: 150 mL/min/{1.73_m2} (ref >59)

## 2022-09-18 LAB — VITAMIN D 25 HYDROXY (VIT D DEFICIENCY, FRACTURES): Vit D, 25-Hydroxy: 37.4 ng/mL (ref 30.0–100.0)

## 2022-09-18 LAB — T4, FREE: Free T4: 0.95 ng/dL (ref 0.82–1.77)

## 2022-09-18 LAB — TSH: TSH: 2.62 u[IU]/mL (ref 0.450–4.500)

## 2022-09-18 NOTE — Telephone Encounter (Signed)
The labs that he got where sufficient as far as what we need to monitor his medications.  I reviewed them and they all look fine.   Lorenz Coaster MD MPH

## 2022-09-18 NOTE — Telephone Encounter (Signed)
Attempted to call patients mother to inform her of the previous message from provider.   Mom unable to be reached, LVM to call back if need be.  SS, CCMA

## 2022-09-23 ENCOUNTER — Ambulatory Visit: Payer: Medicaid Other | Admitting: Speech Pathology

## 2022-09-23 DIAGNOSIS — R41841 Cognitive communication deficit: Secondary | ICD-10-CM | POA: Diagnosis not present

## 2022-09-23 NOTE — Therapy (Signed)
OUTPATIENT SPEECH LANGUAGE PATHOLOGY TREATMENT   Patient Name: Scott Bean MRN: [id number] DOB:March 16, 1991, 32 y.o., male Today's Date: 09/23/2022  PCP: Scott Ip, DO REFERRING PROVIDER: Ranelle Oyster, MD  END OF SESSION:  End of Session - 09/23/22 1101     Visit Number 2    Number of Visits 9    Date for SLP Re-Evaluation 11/04/22    Authorization Type Medicaid    SLP Start Time 1102    SLP Stop Time  1147    SLP Time Calculation (min) 45 min    Activity Tolerance Patient tolerated treatment well              Past Medical History:  Diagnosis Date   Bacteremia 12/10/2011   Cellulitis 12/27/2012   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, implant, and graft 02/06/2013   PNA (pneumonia) 12/09/2011   Traumatic brain injury [facility])    Past Surgical History:  Procedure Laterality Date   decompressive craniotomy     implanation of intrathecal baclofen pump     PEG TUBE PLACEMENT     reimplantation of intrathecal baclofen pump     VENTRICULOPERITONEAL SHUNT     Patient Active Problem List   Diagnosis Date Noted   Spastic tetraplegia (HCC) 04/30/2022   Dysphagia, oropharyngeal 04/30/2022   S/P percutaneous endoscopic gastrostomy (PEG) tube placement (HCC) 09/04/2021   Full incontinence of feces 09/04/2021   Urinary incontinence without sensory awareness 09/04/2021   At high risk for pressure injury of skin 09/04/2021   Irritation around percutaneous endoscopic gastrostomy (PEG) tube site (HCC) 08/30/2020   Presence of intrathecal baclofen pump 08/02/2019   Transient alteration of awareness 03/18/2019   Essential (primary) hypertension 11/12/2017   Gastroesophageal reflux disease 11/12/2017   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   Encounter for long-term (current) use of other medications 08/10/2012   H/O gastrointestinal disease 05/15/2011   Allergic state 05/15/2011   Obstructive hydrocephalus (HCC) 05/14/2011    Closed skull fracture with intracranial injury, with loss of consciousness (HCC) 05/14/2011   Cognitive deficit as late effect of traumatic brain injury (HCC) 05/14/2011   Hydrocephalus (HCC) 05/14/2011   Traumatic brain injury with loss of consciousness (HCC) 05/14/2011    ONSET DATE: 09/03/22  REFERRING DIAG:  N82.9,F62.1H0Q (ICD-10-CM) - Cognitive deficit as late effect of traumatic brain injury (HCC)  R47.01 (ICD-10-CM) - Aphasia    THERAPY DIAG:  Cognitive communication deficit  Rationale for Evaluation and Treatment: Habilitation  SUBJECTIVE:   SUBJECTIVE STATEMENT: "Doing well, called the company to help with the layout but we haven't gotten it fixed yet."--mother.  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   Prior speech therapy course 2023-resulting in procurement of eye gaze device.     PAIN:  Are you having pain?  No overt signs or symptoms of pain.  FALLS: Has patient fallen in last 6 months?  Comment: N/A  LIVING ENVIRONMENT: Lives with: lives with their family Lives in: House/apartment  PLOF:  Level of assistance: Needed assistance with ADLs, Needed assistance with IADLS   PATIENT GOALS: Increased independent in utilizing device.   OBJECTIVE:    COGNITION: Overall cognitive status: Impaired Areas of impairment:  Global impairment secondary to TBI 2006 Functional deficits: Total A by family for care needs.   RECEPTIVE LANGUAGE Overall: Impaired: simple YES/NO questions: Impaired: simple Following directions: Not tested Conversation:  Unable to assess due to severity of impairment.   EXPRESSIVE LANGUAGE: Level of generative/spontaneous verbalization: none Non-verbal means of communication: Tobii Dynavox TD I-13 Automatic speech: Grunting, appears unrelated to expressive communication Comments: limited  expressive language, emerging skill in use of SGD during structured tasks; mother reports use of eye blinks occasionally effective for affirming/negating  SPEECH GENERATING DEVICE:  Mode of access; eye gaze, wheel chair mounted.  Communication needs: family, medical providers, community.  Current page set up: 4x4 screen.  Selection accuracy: unable to assess, as pt attends without all hardware requires for wheelchair mount  Caregiver comments: Caregiver endorses creating functional layout for AAC device.     STANDARDIZED ASSESSMENTS: Deferred d/t nature of impairments   PATIENT REPORTED OUTCOME MEASURES (PROM): Deferred    TODAY'S TREATMENT:                                                                                                                                         DATE:   09/23/22: Mother has concerns with page layout on certain pages. SLP led through how to edit each page and have them all follow the same format. Information given on buying key to unlock AAC device in order to add links to music/videos. Quick phrases added to address patient's needs. Education on how to make hidden buttons seen when not on edit mode. Mother demonstrated editing pages by walking through adding buttons, and un-hiding buttons as needed. Mother able to add preferred TV shows and take pictures with front camera. Advice given on not editing pages unless needed. Handouts provided on steps demonstrated today. Patient's mother endorses that it will just take time for her to play around with the system. She will buy the key so that music and videos can be added next session.   09/09/22: Goals reviewed for patient. Mother wants Scott Bean to increase his independence, and to make requests. SLP led through adding words on the device. Tasks completed today: changing grid size. Goal to set up pages/words and create the best layout in order for patient to increase in independent communication. Goals discussed, all  questions answered to patient satisfaction.   PATIENT EDUCATION: Education details: See above.  Person educated: Patient Education method: Medical illustrator Education comprehension: verbalized understanding and returned demonstration   GOALS: Goals reviewed with patient? Yes  SHORT TERM GOALS: Target date: 10/07/2022  Pt will select appropriate icon to engage in 1 conversational turn or make simple request during structured practice with 50% accuracy given mod-A Baseline: 40% accuracy at last session 03/28/23 Goal status: IN PROGRESS  2.  Pt's caregiver will demonstrate adding, deleting, moving, editing buttons with min-A over 2 sessions Baseline: mother can move buttons Goal status: IN PROGRESS  3.  Pt's mother will adjust device settings (brightness, volume) given min-A over 2 sessions Baseline: mother unable to do Goal status: IN PROGRESS   LONG TERM GOALS: Target  date: 11/04/2022  Pt's caregiver will report pt using SGD to make x3 communicative attempts, outside of structured practice with mod-A over 1 week period Baseline: only using in structured practice Goal status: IN PROGRESS  2.  Pt will complete HEP 5/7 days over 2 week period Baseline: intermittent HEP completion Goal status: IN PROGRESS  3.  Pt's caregiver will report improved confidence in ability to modify pt's device to optimize it's use in home and community Baseline: Mother reports competency in moving buttons, turning on/off device Goal status: IN PROGRESS   ASSESSMENT:  CLINICAL IMPRESSION: Patient is a 32 y.o. M who was seen today for AAC evaluation, with primary caregivers present. Pt presents with profound global language impairment with emerging skills in use of eye gaze SGD. Pt's mother is requesting additional help with eye gaze AAC device: implementation and modification. Mother is able to move icons, turn device on/off, start snap program. Requires A from niece to make modifications  to device. Current employment of device is limited to structured practice with mom, who reports redundancy and lack of current options may be impacting patient's motivation to participate. Requests A from SLP in making device more dynamic and functional for patient. Skilled ST is indicated to address caregiver education to ensure primary caregiver is able to IND modify device on ongoing basis to support growth of expressive language skills and enhance pt's engagement with device to ensure optimization of it's usage. Prior therapy course was illustrative of pt's ability to navigate device given support of communication partner.   OBJECTIVE IMPAIRMENTS: include expressive language and receptive language. These impairments are limiting patient from effectively communicating at home and in community. Factors affecting potential to achieve goals and functional outcome are ability to learn/carryover information, co-morbidities, cooperation/participation level, and severity of impairments. Patient will benefit from skilled SLP services to address above impairments and improve overall function.  REHAB POTENTIAL: Good to meet established goals  PLAN:  SLP FREQUENCY: 1x/week  SLP DURATION: 8 weeks  PLANNED INTERVENTIONS: Internal/external aids, Multimodal communication approach, and Patient/family education  Check all possible CPT codes: 16109, [phone]   Check all conditions that are expected to impact treatment: Cognitive Impairment or Intellectual disability and Neurological condition and/or seizures   If treatment provided at initial evaluation, no treatment charged due to lack of authorization.    [place], Student-SLP 09/23/2022, 11:17 AM

## 2022-09-30 ENCOUNTER — Ambulatory Visit: Payer: Medicaid Other | Admitting: Speech Pathology

## 2022-09-30 DIAGNOSIS — R41841 Cognitive communication deficit: Secondary | ICD-10-CM

## 2022-09-30 NOTE — Therapy (Signed)
OUTPATIENT SPEECH LANGUAGE PATHOLOGY TREATMENT   Patient Name: Scott Bean MRN: [id number] DOB:[birth year], 32 y.o., male Today's Date: 09/30/2022  PCP: Raliegh Ip, DO REFERRING PROVIDER: Ranelle Oyster, MD  END OF SESSION:  End of Session - 09/30/22 1047     Visit Number 3    Number of Visits 9    Date for SLP Re-Evaluation 11/04/22    Authorization Type Medicaid    SLP Start Time 1100    SLP Stop Time  1145    SLP Time Calculation (min) 45 min    Activity Tolerance Patient tolerated treatment well               Past Medical History:  Diagnosis Date   Bacteremia 12/10/2011   Cellulitis 12/27/2012   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, implant, and graft 02/06/2013   PNA (pneumonia) 12/09/2011   Traumatic brain injury [facility])    Past Surgical History:  Procedure Laterality Date   decompressive craniotomy     implanation of intrathecal baclofen pump     PEG TUBE PLACEMENT     reimplantation of intrathecal baclofen pump     VENTRICULOPERITONEAL SHUNT     Patient Active Problem List   Diagnosis Date Noted   Spastic tetraplegia (HCC) 04/30/2022   Dysphagia, oropharyngeal 04/30/2022   S/P percutaneous endoscopic gastrostomy (PEG) tube placement (HCC) 09/04/2021   Full incontinence of feces 09/04/2021   Urinary incontinence without sensory awareness 09/04/2021   At high risk for pressure injury of skin 09/04/2021   Irritation around percutaneous endoscopic gastrostomy (PEG) tube site (HCC) 08/30/2020   Presence of intrathecal baclofen pump 08/02/2019   Transient alteration of awareness 03/18/2019   Essential (primary) hypertension 11/12/2017   Gastroesophageal reflux disease 11/12/2017   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   Encounter for long-term (current) use of other medications 08/10/2012   H/O gastrointestinal disease 05/15/2011   Allergic state 05/15/2011   Obstructive hydrocephalus (HCC) 05/14/2011    Closed skull fracture with intracranial injury, with loss of consciousness (HCC) 05/14/2011   Cognitive deficit as late effect of traumatic brain injury (HCC) 05/14/2011   Hydrocephalus (HCC) 05/14/2011   Traumatic brain injury with loss of consciousness (HCC) 05/14/2011    ONSET DATE: 09/03/22  REFERRING DIAG:  C16.6,A63.0Z6W (ICD-10-CM) - Cognitive deficit as late effect of traumatic brain injury (HCC)  R47.01 (ICD-10-CM) - Aphasia    THERAPY DIAG:  Cognitive communication deficit  Rationale for Evaluation and Treatment: Habilitation  SUBJECTIVE:   SUBJECTIVE STATEMENT: "Its been a busy week"-mother Pt accompanied by: family member  PERTINENT HISTORY: 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   Prior speech therapy course 2023-resulting in procurement of eye gaze device.     PAIN:  Are you having pain?  No overt signs or symptoms of pain.  FALLS: Has patient fallen in last 6 months?  Comment: N/A  LIVING ENVIRONMENT: Lives with: lives with their family Lives in: House/apartment  PLOF:  Level of assistance: Needed assistance with ADLs, Needed assistance with IADLS   PATIENT GOALS: Increased independent in utilizing device.   OBJECTIVE:    COGNITION: Overall cognitive status: Impaired Areas of impairment:  Global impairment secondary to TBI 2006 Functional deficits: Total A by family for care needs.   RECEPTIVE LANGUAGE Overall: Impaired: simple YES/NO questions: Impaired: simple Following directions: Not tested Conversation:  Unable to assess due to severity of impairment.   EXPRESSIVE LANGUAGE:  Level of generative/spontaneous verbalization: none Non-verbal means of communication: Tobii Dynavox TD I-13 Automatic speech: Grunting, appears unrelated to expressive communication Comments: limited expressive language, emerging skill in use of SGD during structured  tasks; mother reports use of eye blinks occasionally effective for affirming/negating  SPEECH GENERATING DEVICE:  Mode of access; eye gaze, wheel chair mounted.  Communication needs: family, medical providers, community.  Current page set up: 4x4 screen.  Selection accuracy: unable to assess, as pt attends without all hardware requires for wheelchair mount  Caregiver comments: Caregiver endorses creating functional layout for AAC device.     STANDARDIZED ASSESSMENTS: Deferred d/t nature of impairments   PATIENT REPORTED OUTCOME MEASURES (PROM): Deferred    TODAY'S TREATMENT:                                                                                                                                         DATE:   09/30/22: Mother was able to download the key to allow for links on the page. Mother says that she was able to play with the device some and does research on how to add things and use it. She has no specific questions at this time, but has frustration with how many times and how long updates take. SLP led through adding links to the web to increase personalization of the AAC device. HEP given of links to helpful videos and education provided on using [employer] as a Advertising account planner.   09/23/22: Mother has concerns with page layout on certain pages. SLP led through how to edit each page and have them all follow the same format. Information given on buying key to unlock AAC device in order to add links to music/videos. Quick phrases added to address patient's needs. Education on how to make hidden buttons seen when not on edit mode. Mother demonstrated editing pages by walking through adding buttons, and un-hiding buttons as needed. Mother able to add preferred TV shows and take pictures with front camera. Advice given on not editing pages unless needed. Handouts provided on steps demonstrated today. Patient's mother endorses that it will just take time for her to  play around with the system. She will buy the key so that music and videos can be added next session.   09/09/22: Goals reviewed for patient. Mother wants [name] to increase his independence, and to make requests. SLP led through adding words on the device. Tasks completed today: changing grid size. Goal to set up pages/words and create the best layout in order for patient to increase in independent communication. Goals discussed, all questions answered to patient satisfaction.   PATIENT EDUCATION: Education details: See above.  Person educated: Patient Education method: Medical illustrator Education comprehension: verbalized understanding and returned demonstration   GOALS: Goals reviewed with patient? Yes  SHORT TERM GOALS: Target date: 10/07/2022  Pt will select appropriate icon to engage in  1 conversational turn or make simple request during structured practice with 50% accuracy given mod-A Baseline: 40% accuracy at last session 03/28/23 Goal status: IN PROGRESS  2.  Pt's caregiver will demonstrate adding, deleting, moving, editing buttons with min-A over 2 sessions Baseline: mother can move buttons Goal status: IN PROGRESS  3.  Pt's mother will adjust device settings (brightness, volume) given min-A over 2 sessions Baseline: mother unable to do Goal status: IN PROGRESS   LONG TERM GOALS: Target date: 11/04/2022  Pt's caregiver will report pt using SGD to make x3 communicative attempts, outside of structured practice with mod-A over 1 week period Baseline: only using in structured practice Goal status: IN PROGRESS  2.  Pt will complete HEP 5/7 days over 2 week period Baseline: intermittent HEP completion Goal status: IN PROGRESS  3.  Pt's caregiver will report improved confidence in ability to modify pt's device to optimize it's use in home and community Baseline: Mother reports competency in moving buttons, turning on/off device Goal status: IN  PROGRESS   ASSESSMENT:  CLINICAL IMPRESSION: Patient is a 32 y.o. M who was seen today for AAC evaluation, with primary caregivers present. Pt presents with profound global language impairment with emerging skills in use of eye gaze SGD. Pt's mother is requesting additional help with eye gaze AAC device: implementation and modification. Mother is able to move icons, turn device on/off, start snap program. Requires A from niece to make modifications to device. Current employment of device is limited to structured practice with mom, who reports redundancy and lack of current options may be impacting patient's motivation to participate. Requests A from SLP in making device more dynamic and functional for patient. Skilled ST is indicated to address caregiver education to ensure primary caregiver is able to IND modify device on ongoing basis to support growth of expressive language skills and enhance pt's engagement with device to ensure optimization of it's usage. Prior therapy course was illustrative of pt's ability to navigate device given support of communication partner.   OBJECTIVE IMPAIRMENTS: include expressive language and receptive language. These impairments are limiting patient from effectively communicating at home and in community. Factors affecting potential to achieve goals and functional outcome are ability to learn/carryover information, co-morbidities, cooperation/participation level, and severity of impairments. Patient will benefit from skilled SLP services to address above impairments and improve overall function.  REHAB POTENTIAL: Good to meet established goals  PLAN:  SLP FREQUENCY: 1x/week  SLP DURATION: 8 weeks  PLANNED INTERVENTIONS: Internal/external aids, Multimodal communication approach, and Patient/family education  Check all possible CPT codes: 84696, [phone]   Check all conditions that are expected to impact treatment: Cognitive Impairment or Intellectual disability  and Neurological condition and/or seizures   If treatment provided at initial evaluation, no treatment charged due to lack of authorization.    [place], Student-SLP 09/30/2022, 10:47 AM

## 2022-09-30 NOTE — Patient Instructions (Addendum)
Adding link to button youtube:   How to connect a web link to a button in td snap [email or web address]

## 2022-10-14 ENCOUNTER — Ambulatory Visit: Payer: MEDICAID | Attending: Physical Medicine & Rehabilitation | Admitting: Speech Pathology

## 2022-10-14 DIAGNOSIS — R41841 Cognitive communication deficit: Secondary | ICD-10-CM | POA: Diagnosis present

## 2022-10-14 NOTE — Patient Instructions (Signed)
in the url bar, type [email or web address] and then paste the image link after the question mark.  Make sure link ends in .jpg

## 2022-10-14 NOTE — Therapy (Signed)
OUTPATIENT SPEECH LANGUAGE PATHOLOGY TREATMENT   Patient Name: Scott Bean MRN: [id number] DOB:1991-03-20, 32 y.o., male Today's Date: 10/14/2022  PCP: Raliegh Ip, DO REFERRING PROVIDER: Ranelle Oyster, MD  END OF SESSION:  End of Session - 10/14/22 1056     Visit Number 4    Number of Visits 9    Date for SLP Re-Evaluation 11/04/22    Authorization Type Medicaid    SLP Start Time 1100    SLP Stop Time  1145    SLP Time Calculation (min) 45 min    Activity Tolerance Patient tolerated treatment well                Past Medical History:  Diagnosis Date   Bacteremia 12/10/2011   Cellulitis 12/27/2012   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, implant, and graft 02/06/2013   PNA (pneumonia) 12/09/2011   Traumatic brain injury [facility])    Past Surgical History:  Procedure Laterality Date   decompressive craniotomy     implanation of intrathecal baclofen pump     PEG TUBE PLACEMENT     reimplantation of intrathecal baclofen pump     VENTRICULOPERITONEAL SHUNT     Patient Active Problem List   Diagnosis Date Noted   Spastic tetraplegia (HCC) 04/30/2022   Dysphagia, oropharyngeal 04/30/2022   S/P percutaneous endoscopic gastrostomy (PEG) tube placement (HCC) 09/04/2021   Full incontinence of feces 09/04/2021   Urinary incontinence without sensory awareness 09/04/2021   At high risk for pressure injury of skin 09/04/2021   Irritation around percutaneous endoscopic gastrostomy (PEG) tube site (HCC) 08/30/2020   Presence of intrathecal baclofen pump 08/02/2019   Transient alteration of awareness 03/18/2019   Essential (primary) hypertension 11/12/2017   Gastroesophageal reflux disease 11/12/2017   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   Encounter for long-term (current) use of other medications 08/10/2012   H/O gastrointestinal disease 05/15/2011   Allergic state 05/15/2011   Obstructive hydrocephalus (HCC)  05/14/2011   Closed skull fracture with intracranial injury, with loss of consciousness (HCC) 05/14/2011   Cognitive deficit as late effect of traumatic brain injury (HCC) 05/14/2011   Hydrocephalus (HCC) 05/14/2011   Traumatic brain injury with loss of consciousness (HCC) 05/14/2011    ONSET DATE: 09/03/22  REFERRING DIAG:  H08.6,V78.4O9G (ICD-10-CM) - Cognitive deficit as late effect of traumatic brain injury (HCC)  R47.01 (ICD-10-CM) - Aphasia    THERAPY DIAG:  Cognitive communication deficit  Rationale for Evaluation and Treatment: Habilitation  SUBJECTIVE:   SUBJECTIVE STATEMENT: "Doing well, I put linked some shows and music on there."--mother Pt accompanied by: family member  PERTINENT HISTORY: 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   Prior speech therapy course 2023-resulting in procurement of eye gaze device.     PAIN:  Are you having pain?  No overt signs or symptoms of pain.  FALLS: Has patient fallen in last 6 months?  Comment: N/A  LIVING ENVIRONMENT: Lives with: lives with their family Lives in: House/apartment  PLOF:  Level of assistance: Needed assistance with ADLs, Needed assistance with IADLS   PATIENT GOALS: Increased independent in utilizing device.   OBJECTIVE:    COGNITION: Overall cognitive status: Impaired Areas of impairment:  Global impairment secondary to TBI 2006 Functional deficits: Total A by family for care needs.   RECEPTIVE LANGUAGE Overall: Impaired: simple YES/NO questions: Impaired: simple Following directions: Not tested Conversation:  Unable to assess due to  severity of impairment.   EXPRESSIVE LANGUAGE: Level of generative/spontaneous verbalization: none Non-verbal means of communication: Tobii Dynavox TD I-13 Automatic speech: Grunting, appears unrelated to expressive communication Comments: limited expressive language,  emerging skill in use of SGD during structured tasks; mother reports use of eye blinks occasionally effective for affirming/negating  SPEECH GENERATING DEVICE:  Mode of access; eye gaze, wheel chair mounted.  Communication needs: family, medical providers, community.  Current page set up: 4x4 screen.  Selection accuracy: unable to assess, as pt attends without all hardware requires for wheelchair mount  Caregiver comments: Caregiver endorses creating functional layout for AAC device.     STANDARDIZED ASSESSMENTS: Deferred d/t nature of impairments   PATIENT REPORTED OUTCOME MEASURES (PROM): Deferred    TODAY'S TREATMENT:                                                                                                                                         DATE:   10/14/22: Mother stated that she was able to add some links to music and tv shows with little trouble. ST focused on eye gaze with device and was able to visually track 5 dots across the screen with max-A of cueing, modeling, feedback, and physical cues from mother to look across to his right. Pt prefers to look to his left side, SLP recommended putting the device more to the left. [name] was able to increase his right head turn throughout the session with max-A of cues, and feedback. Mother states that sometimes he wants to work, and sometimes he does not, but that looking at pictures of girls increases his want to engage in practice.   09/30/22: Mother was able to download the key to allow for links on the page. Mother says that she was able to play with the device some and does research on how to add things and use it. She has no specific questions at this time, but has frustration with how many times and how long updates take. SLP led through adding links to the web to increase personalization of the AAC device. HEP given of links to helpful videos and education provided on using [employer] as a Advertising account planner.    09/23/22: Mother has concerns with page layout on certain pages. SLP led through how to edit each page and have them all follow the same format. Information given on buying key to unlock AAC device in order to add links to music/videos. Quick phrases added to address patient's needs. Education on how to make hidden buttons seen when not on edit mode. Mother demonstrated editing pages by walking through adding buttons, and un-hiding buttons as needed. Mother able to add preferred TV shows and take pictures with front camera. Advice given on not editing pages unless needed. Handouts provided on steps demonstrated today. Patient's mother endorses that it will just take time for her to play around  with the system. She will buy the key so that music and videos can be added next session.   09/09/22: Goals reviewed for patient. Mother wants [name] to increase his independence, and to make requests. SLP led through adding words on the device. Tasks completed today: changing grid size. Goal to set up pages/words and create the best layout in order for patient to increase in independent communication. Goals discussed, all questions answered to patient satisfaction.   PATIENT EDUCATION: Education details: See above.  Person educated: Patient Education method: Medical illustrator Education comprehension: verbalized understanding and returned demonstration   GOALS: Goals reviewed with patient? Yes  SHORT TERM GOALS: Target date: 10/07/2022  Pt will select appropriate icon to engage in 1 conversational turn or make simple request during structured practice with 50% accuracy given mod-A Baseline: 40% accuracy at last session 03/28/23 Goal status: NOT MET  2.  Pt's caregiver will demonstrate adding, deleting, moving, editing buttons with min-A over 2 sessions Baseline: mother can move buttons Goal status: MET  3.  Pt's mother will adjust device settings (brightness, volume) given min-A over 2  sessions Baseline: mother unable to do Goal status: MET   LONG TERM GOALS: Target date: 11/04/2022  Pt's caregiver will report pt using SGD to make x3 communicative attempts, outside of structured practice with mod-A over 1 week period Baseline: only using in structured practice Goal status: IN PROGRESS  2.  Pt will complete HEP 5/7 days over 2 week period Baseline: intermittent HEP completion Goal status: IN PROGRESS  3.  Pt's caregiver will report improved confidence in ability to modify pt's device to optimize it's use in home and community Baseline: Mother reports competency in moving buttons, turning on/off device Goal status: IN PROGRESS   ASSESSMENT:  CLINICAL IMPRESSION: Patient is a 32 y.o. M who was seen today for AAC evaluation, with primary caregivers present. Pt presents with profound global language impairment with emerging skills in use of eye gaze SGD. Pt's mother is requesting additional help with eye gaze AAC device: implementation and modification. Mother is able to move icons, turn device on/off, start snap program. Requires A from niece to make modifications to device. Current employment of device is limited to structured practice with mom, who reports redundancy and lack of current options may be impacting patient's motivation to participate. Requests A from SLP in making device more dynamic and functional for patient. Skilled ST is indicated to address caregiver education to ensure primary caregiver is able to IND modify device on ongoing basis to support growth of expressive language skills and enhance pt's engagement with device to ensure optimization of it's usage. Prior therapy course was illustrative of pt's ability to navigate device given support of communication partner.   OBJECTIVE IMPAIRMENTS: include expressive language and receptive language. These impairments are limiting patient from effectively communicating at home and in community. Factors affecting  potential to achieve goals and functional outcome are ability to learn/carryover information, co-morbidities, cooperation/participation level, and severity of impairments. Patient will benefit from skilled SLP services to address above impairments and improve overall function.  REHAB POTENTIAL: Good to meet established goals  PLAN:  SLP FREQUENCY: 1x/week  SLP DURATION: 8 weeks  PLANNED INTERVENTIONS: Internal/external aids, Multimodal communication approach, and Patient/family education  Check all possible CPT codes: 16109, [phone]   Check all conditions that are expected to impact treatment: Cognitive Impairment or Intellectual disability and Neurological condition and/or seizures   If treatment provided at initial evaluation, no treatment charged due  to lack of authorization.    [place], Student-SLP 10/14/2022, 10:56 AM

## 2022-10-20 ENCOUNTER — Telehealth: Payer: Self-pay | Admitting: Physical Medicine & Rehabilitation

## 2022-10-20 NOTE — Telephone Encounter (Signed)
Notified refill on file

## 2022-10-20 NOTE — Telephone Encounter (Signed)
Requesting medication refill, did not state which medicine.

## 2022-10-21 ENCOUNTER — Ambulatory Visit: Payer: MEDICAID | Admitting: Speech Pathology

## 2022-10-28 ENCOUNTER — Ambulatory Visit: Payer: MEDICAID | Admitting: Speech Pathology

## 2022-10-28 DIAGNOSIS — R41841 Cognitive communication deficit: Secondary | ICD-10-CM | POA: Diagnosis not present

## 2022-10-28 NOTE — Therapy (Signed)
OUTPATIENT SPEECH LANGUAGE PATHOLOGY TREATMENT   Patient Name: Scott Bean MRN: [id number] DOB:1990-07-07, 32 y.o., male Today's Date: 10/28/2022  PCP: Scott Ip, DO REFERRING PROVIDER: Ranelle Oyster, MD  END OF SESSION:  End of Session - 10/28/22 1101     Visit Number 5    Number of Visits 9    Date for SLP Re-Evaluation 11/04/22    Authorization Type Medicaid    SLP Start Time 1101    SLP Stop Time  1146    SLP Time Calculation (min) 45 min    Activity Tolerance Patient tolerated treatment well              Past Medical History:  Diagnosis Date   Bacteremia 12/10/2011   Cellulitis 12/27/2012   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, implant, and graft 02/06/2013   PNA (pneumonia) 12/09/2011   Traumatic brain injury Scott Bean)    Past Surgical History:  Procedure Laterality Date   decompressive craniotomy     implanation of intrathecal baclofen pump     PEG TUBE PLACEMENT     reimplantation of intrathecal baclofen pump     VENTRICULOPERITONEAL SHUNT     Patient Active Problem List   Diagnosis Date Noted   Spastic tetraplegia (HCC) 04/30/2022   Dysphagia, oropharyngeal 04/30/2022   S/P percutaneous endoscopic gastrostomy (PEG) tube placement (HCC) 09/04/2021   Full incontinence of feces 09/04/2021   Urinary incontinence without sensory awareness 09/04/2021   At high risk for pressure injury of skin 09/04/2021   Irritation around percutaneous endoscopic gastrostomy (PEG) tube site (HCC) 08/30/2020   Presence of intrathecal baclofen pump 08/02/2019   Transient alteration of awareness 03/18/2019   Essential (primary) hypertension 11/12/2017   Gastroesophageal reflux disease 11/12/2017   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   Encounter for long-term (current) use of other medications 08/10/2012   H/O gastrointestinal disease 05/15/2011   Allergic state 05/15/2011   Obstructive hydrocephalus (HCC) 05/14/2011    Closed skull fracture with intracranial injury, with loss of consciousness (HCC) 05/14/2011   Cognitive deficit as late effect of traumatic brain injury (HCC) 05/14/2011   Hydrocephalus (HCC) 05/14/2011   Traumatic brain injury with loss of consciousness (HCC) 05/14/2011    ONSET DATE: 09/03/22  REFERRING DIAG:  V78.4,O96.2X5M (ICD-10-CM) - Cognitive deficit as late effect of traumatic brain injury (HCC)  R47.01 (ICD-10-CM) - Aphasia    THERAPY DIAG:  Cognitive communication deficit  Rationale for Evaluation and Treatment: Habilitation  SUBJECTIVE:   SUBJECTIVE STATEMENT: "We have been playing games" Pt accompanied by: family member  PERTINENT HISTORY: 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   Prior speech therapy course 2023-resulting in procurement of eye gaze device.     PAIN:  Are you having pain?  No overt signs or symptoms of pain.  FALLS: Has patient fallen in last 6 months?  Comment: N/A  LIVING ENVIRONMENT: Lives with: lives with their family Lives in: House/apartment  PLOF:  Level of assistance: Needed assistance with ADLs, Needed assistance with IADLS   PATIENT GOALS: Increased independent in utilizing device.   OBJECTIVE:    COGNITION: Overall cognitive status: Impaired Areas of impairment:  Global impairment secondary to TBI 2006 Functional deficits: Total A by family for care needs.   RECEPTIVE LANGUAGE Overall: Impaired: simple YES/NO questions: Impaired: simple Following directions: Not tested Conversation:  Unable to assess due to severity of impairment.   EXPRESSIVE LANGUAGE: Level  of generative/spontaneous verbalization: none Non-verbal means of communication: Tobii Dynavox TD I-13 Automatic speech: Grunting, appears unrelated to expressive communication Comments: limited expressive language, emerging skill in use of SGD during structured tasks;  mother reports use of eye blinks occasionally effective for affirming/negating  SPEECH GENERATING DEVICE:  Mode of access; eye gaze, wheel chair mounted.  Communication needs: family, medical providers, community.  Current page set up: 4x4 screen.  Selection accuracy: unable to assess, as pt attends without all hardware requires for wheelchair mount  Caregiver comments: Caregiver endorses creating functional layout for AAC device.     STANDARDIZED ASSESSMENTS: Deferred d/t nature of impairments   PATIENT REPORTED OUTCOME MEASURES (PROM): Deferred    TODAY'S TREATMENT:                                                                                                                                         DATE:   10/28/22: Mother has put additional games on the tablet to encourage Scott Bean to interact with device more often, states that he is able to turn his music on and that he enjoys it. Patient demonstrated ability to turn on a song 3x though eye gaze device with frequent max-A of gestures, repeated request, and models. Scott Bean was able to select functional phrase with extended wait time. Primary SLP discussed coming collaboration with Tob[employer] expert to discuss current and extended needs. Mother states that she is comfortable adding icons. Mother states that Scott Bean enjoys art, and education was provided on using art app on the device, however additional guidance is needed in this area. Scott Bean's stamina with the device is 30 minutes.  Adjusted match user time to decrease wait time for most desired buttons. Mother states concerns with brain stimulant medication Scott Bean is on, and states that she does not notice a difference.   10/14/22: Mother stated that she was able to add some links to music and tv shows with little trouble. ST focused on eye gaze with device and was able to visually track 5 dots across the screen with max-A of cueing, modeling, feedback, and physical cues from mother to look across to his  right. Pt prefers to look to his left side, SLP recommended putting the device more to the left. Scott Bean was able to increase his right head turn throughout the session with max-A of cues, and feedback. Mother states that sometimes he wants to work, and sometimes he does not, but that looking at pictures of girls increases his want to engage in practice.   09/30/22: Mother was able to download the key to allow for links on the page. Mother says that she was able to play with the device some and does research on how to add things and use it. She has no specific questions at this time, but has frustration with how many times and how long updates take. SLP led through adding links to the  web to increase personalization of the AAC device. HEP given of links to helpful videos and education provided on using [employer] as a Advertising account planner.   09/23/22: Mother has concerns with page layout on certain pages. SLP led through how to edit each page and have them all follow the same format. Information given on buying key to unlock AAC device in order to add links to music/videos. Quick phrases added to address patient's needs. Education on how to make hidden buttons seen when not on edit mode. Mother demonstrated editing pages by walking through adding buttons, and un-hiding buttons as needed. Mother able to add preferred TV shows and take pictures with front camera. Advice given on not editing pages unless needed. Handouts provided on steps demonstrated today. Patient's mother endorses that it will just take time for her to play around with the system. She will buy the key so that music and videos can be added next session.   09/09/22: Goals reviewed for patient. Mother wants Scott Bean to increase his independence, and to make requests. SLP led through adding words on the device. Tasks completed today: changing grid size. Goal to set up pages/words and create the best layout in order for patient to increase in  independent communication. Goals discussed, all questions answered to patient satisfaction.   PATIENT EDUCATION: Education details: See above.  Person educated: Patient Education method: Medical illustrator Education comprehension: verbalized understanding and returned demonstration   GOALS: Goals reviewed with patient? Yes  SHORT TERM GOALS: Target date: 10/07/2022  Pt will select appropriate icon to engage in 1 conversational turn or make simple request during structured practice with 50% accuracy given mod-A Baseline: 40% accuracy at last session 03/28/23 Goal status: NOT MET  2.  Pt's caregiver will demonstrate adding, deleting, moving, editing buttons with min-A over 2 sessions Baseline: mother can move buttons Goal status: MET  3.  Pt's mother will adjust device settings (brightness, volume) given min-A over 2 sessions Baseline: mother unable to do Goal status: MET   LONG TERM GOALS: Target date: 11/04/2022  Pt's caregiver will report pt using SGD to make x3 communicative attempts, outside of structured practice with mod-A over 1 week period Baseline: only using in structured practice Goal status: IN PROGRESS  2.  Pt will complete HEP 5/7 days over 2 week period Baseline: intermittent HEP completion Goal status: MET  3.  Pt's caregiver will report improved confidence in ability to modify pt's device to optimize it's use in home and community Baseline: Mother reports competency in moving buttons, turning on/off device Goal status: IN PROGRESS   ASSESSMENT:  CLINICAL IMPRESSION: Patient is a 32 y.o. M who was seen today for AAC evaluation, with primary caregivers present. Pt presents with profound global language impairment with emerging skills in use of eye gaze SGD. Pt's mother is requesting additional help with eye gaze AAC device: implementation and modification. Mother is able to move icons, turn device on/off, start snap program. Requires A from niece to  make modifications to device. Current employment of device is limited to structured practice with mom, who reports redundancy and lack of current options may be impacting patient's motivation to participate. Requests A from SLP in making device more dynamic and functional for patient. Skilled ST is indicated to address caregiver education to ensure primary caregiver is able to IND modify device on ongoing basis to support growth of expressive language skills and enhance pt's engagement with device to ensure optimization of it's usage.  Prior therapy course was illustrative of pt's ability to navigate device given support of communication partner.   OBJECTIVE IMPAIRMENTS: include expressive language and receptive language. These impairments are limiting patient from effectively communicating at home and in community. Factors affecting potential to achieve goals and functional outcome are ability to learn/carryover information, co-morbidities, cooperation/participation level, and severity of impairments. Patient will benefit from skilled SLP services to address above impairments and improve overall function.  REHAB POTENTIAL: Good to meet established goals  PLAN:  SLP FREQUENCY: 1x/week  SLP DURATION: 8 weeks  PLANNED INTERVENTIONS: Internal/external aids, Multimodal communication approach, and Patient/family education  Check all possible CPT codes: 16109, [phone]   Check all conditions that are expected to impact treatment: Cognitive Impairment or Intellectual disability and Neurological condition and/or seizures   If treatment provided at initial evaluation, no treatment charged due to lack of authorization.    [place], Student-SLP 10/28/2022, 11:01 AM

## 2022-11-10 ENCOUNTER — Other Ambulatory Visit: Payer: Self-pay | Admitting: Family Medicine

## 2022-11-11 NOTE — Progress Notes (Signed)
Medical Nutrition Therapy - Progress Note Appt start time: 1:15 PM  Appt end time: 2:00 PM  Reason for referral: Gtube dependence Referring provider: Dr. Artis Flock - Neuro Pertinent medical hx: Spastic hemiplegia, epilepsy, obstructive hydrocephalus, TBI with loss of consciousness, cognitive decline, h/o gastrointestinal disease, transient alteration of awareness, urinary and fecal incontinence, high risk for pressure injury of skin, dysphagia, +gtube  Assessment: Food allergies: none Pertinent Medications: see medication list Vitamins/Supplements: none Pertinent labs:  (6/12) CBC, Thyroid Panel, Vitamin D : WNL (6/12) CMP: Serum Creatinine - 0.40 (low), BUN/Creatinine Ratio - 30 (high), Alkaline Phosphatase - 134 (high) (6/12) Lipid Panel: Total Cholesterol - 202 (high), LDL Cholesterol - 107 (high)  (8/20) Anthropometrics: Ht: 200 cm   Wt: 97.2 kg (214 lb 6.4 oz) BMI: 24.1 kg IBW based on Hamwi Equation: 97 kg (+/- 10%)  09/16/22 Wt: 99.0 kg 08/21/22 Wt: 97.7 kg 07/23/22 Wt: 97.7 kg 02/24/22 Wt: 103.6 kg (*mom reports weight was 226#*) 01/13/22 Wt: 102 kg (226 # 6.4 oz) 12/05/21 Wt: 104.2 kg (229 # 12.8 oz) 10/24/21 Wt: 105.4 kg (231 # 9.6 oz)  UBW: ~200 #    Estimated minimum caloric needs: 1350-1450 kcal/day (based on stable weight trends on current regimen) Estimated minimum protein needs: 0.8 g/kg/day (DRI) Estimated minimum fluid needs: 2000-2400 mL/day (25 kcal/kg)   Primary concerns today: Follow-up given pt with gtube dependence. Mom accompanied pt to appt today.   Dietary Intake Hx: DME: [facility]   Formula: [name] Standard 1.4 and [name]  Current regimen:  Day feeds: 325 mL (1 carton) via gravity feeds x 3 feeds  @ 8 AM, 1 PM, 6 PM (mom will give 1/2 then will wait 30 minutes then other 1/2 of carton) Overnight feeds: none Total Volume: 1 cartons [employer] Standard 1.0, 2 carton [employer] Standard 1.4   FWF: 160 mL with each feed x3, 60  mL after each feed and after medications x4, 120 mL diet cranberry juice @ 6 AM, 180 mL water with Miralax (1200 mL total) Nutrition Supplement: 3 Prosource daily  Previous Supplements Tried: 2kcal HN (gas/bloating)   Feed positioning/location: 15-80 degree incline PO foods: none PO beverages: none  GI: daily, improving and thickening up since starting 3 prosource/day - Miralax daily  GU: "a lot" - very clear   Physical Activity: wheelchair bound  Estimated caloric intake: 1415 kcal/day - meets 97-104% of estimated needs Estimated protein intake: 1.0 g/kg/day - meets 125% of estimated needs Estimated fluid intake: 1922 mL/day - meets 80-96% of estimated needs  Micronutrient Intake  Vitamin A 1440 mcg  Vitamin C 150 mg  Vitamin D 21 mcg  Vitamin E 22.7 mg  Vitamin K 110 mcg  Vitamin B1 (thiamin) 1.7 mg  Vitamin B2 (riboflavin) 1.8 mg  Vitamin B3 (niacin) 21 mg  Vitamin B5 (pantothenic acid) 11 mg  Vitamin B6 2.1 mg  Vitamin B7 (biotin) 60 mcg  Vitamin B9 (folate) 838 mcg  Vitamin B12 6.3 mcg  Choline 515 mg  Calcium 1050 mg  Chromium 76 mcg  Copper 2100 mcg  Fluoride 0 mg  Iodine 170 mcg  Iron 19 mg  Magnesium 394 mg  Manganese 2.4 mg  Molybdenum 135 mcg  Phosphorous 900 mg  Selenium 73.5 mcg  Zinc 18.5 mg  Potassium 2000 mg  Sodium 1005 mg  Chloride 1387 mg  Fiber 11 g   Nutrition Diagnosis: (7/24) Inadequate oral intake related to dysphagia and NPO status as evidenced by  pt dependent on Gtube feedings to meet nutritional needs.  Intervention: Discussed pt's weight trends and current regimen in detail. Discussed recommendations below. All questions answered, family in agreement with plan.   Nutrition Recommendations: - If there is ever a backorder of the [employer] drinks that [name] is on, I would recommend requesting Compleat Standard Plant-Based 1.0 and 1.4. [name] would do the exact same regimen on this formula.  - Continue current regimen. [name] looks great!    Teach back method used.  Monitoring/Evaluation: Continue to Monitor: - Weight trends  - TF tolerance  - Need for Peptide formula  Follow-up scheduled for January 21 @ 2:30 PM.  Total time spent in counseling: 45 minutes.

## 2022-11-17 ENCOUNTER — Telehealth: Payer: Self-pay | Admitting: *Deleted

## 2022-11-17 DIAGNOSIS — S069X9S Unspecified intracranial injury with loss of consciousness of unspecified duration, sequela: Secondary | ICD-10-CM

## 2022-11-17 NOTE — Telephone Encounter (Signed)
Patient's mother requesting refill on Ritalin 10 mg @ [facility]. He has enough for the rest of this week.

## 2022-11-18 MED ORDER — METHYLPHENIDATE HCL 10 MG PO TABS: 10.0000 mg | ORAL_TABLET | Freq: Two times a day (BID) | ORAL | 0 refills | Status: DC

## 2022-11-18 MED ORDER — METHYLPHENIDATE HCL 10 MG PO TABS
10.0000 mg | ORAL_TABLET | Freq: Two times a day (BID) | ORAL | 0 refills | Status: DC
Start: 2022-11-18 — End: 2022-11-21

## 2022-11-18 NOTE — Telephone Encounter (Signed)
Rx's sent for ths month and next

## 2022-11-21 ENCOUNTER — Telehealth: Payer: Self-pay | Admitting: Physical Medicine & Rehabilitation

## 2022-11-21 ENCOUNTER — Telehealth: Payer: Self-pay | Admitting: *Deleted

## 2022-11-21 DIAGNOSIS — S069X9S Unspecified intracranial injury with loss of consciousness of unspecified duration, sequela: Secondary | ICD-10-CM

## 2022-11-21 MED ORDER — METHYLPHENIDATE HCL 10 MG PO TABS
10.0000 mg | ORAL_TABLET | Freq: Two times a day (BID) | ORAL | 0 refills | Status: DC
Start: 2022-11-21 — End: 2023-01-23

## 2022-11-21 MED ORDER — METHYLPHENIDATE HCL 10 MG PO TABS: 10.0000 mg | ORAL_TABLET | Freq: Two times a day (BID) | ORAL | 0 refills | Status: DC

## 2022-11-21 NOTE — Telephone Encounter (Signed)
Scott Bean's mother has called several times about his urgent need for the [facility] [place].

## 2022-11-21 NOTE — Telephone Encounter (Signed)
I personally changed the pharmacy to [facility]. Rx sent. Thanks for your help

## 2022-11-21 NOTE — Telephone Encounter (Signed)
She said [facility] doesn't have his ritalin. She wanted to know if it could be [facility] [place]

## 2022-11-25 ENCOUNTER — Ambulatory Visit (INDEPENDENT_AMBULATORY_CARE_PROVIDER_SITE_OTHER): Payer: MEDICAID | Admitting: Dietician

## 2022-11-25 VITALS — Wt 214.4 lb

## 2022-11-25 DIAGNOSIS — Z931 Gastrostomy status: Secondary | ICD-10-CM | POA: Diagnosis not present

## 2022-11-25 DIAGNOSIS — R638 Other symptoms and signs concerning food and fluid intake: Secondary | ICD-10-CM

## 2022-11-25 DIAGNOSIS — R1310 Dysphagia, unspecified: Secondary | ICD-10-CM | POA: Diagnosis not present

## 2022-11-25 DIAGNOSIS — R1312 Dysphagia, oropharyngeal phase: Secondary | ICD-10-CM

## 2022-11-25 NOTE — Patient Instructions (Addendum)
Nutrition Recommendations: - If there is ever a backorder of the [employer] drinks that Scott Bean is on, I would recommend requesting Compleat Standard Plant-Based 1.0 and 1.4. Scott Bean would do the exact same regimen on this formula.  - Continue current regimen. Scott Bean looks great!   Follow-up scheduled for January 21 @ 2:30 PM.

## 2022-11-26 ENCOUNTER — Encounter: Payer: MEDICAID | Attending: Physical Medicine & Rehabilitation | Admitting: Physical Medicine & Rehabilitation

## 2022-11-26 ENCOUNTER — Encounter: Payer: Self-pay | Admitting: Physical Medicine & Rehabilitation

## 2022-11-26 VITALS — BP 108/70 | HR 66 | Ht 79.0 in | Wt 214.4 lb

## 2022-11-26 DIAGNOSIS — G8250 Quadriplegia, unspecified: Secondary | ICD-10-CM | POA: Diagnosis not present

## 2022-11-26 DIAGNOSIS — S069X9S Unspecified intracranial injury with loss of consciousness of unspecified duration, sequela: Secondary | ICD-10-CM

## 2022-11-26 NOTE — Patient Instructions (Signed)
ALWAYS FEEL FREE TO CALL OUR OFFICE WITH ANY PROBLEMS OR QUESTIONS ([phone])  **PLEASE NOTE** ALL MEDICATION REFILL REQUESTS (INCLUDING CONTROLLED SUBSTANCES) NEED TO BE MADE AT LEAST 7 DAYS PRIOR TO REFILL BEING DUE. ANY REFILL REQUESTS INSIDE THAT TIME FRAME MAY RESULT IN DELAYS IN RECEIVING YOUR PRESCRIPTION.                    

## 2022-11-26 NOTE — Progress Notes (Signed)
Subjective:    Patient ID: Scott Bean, male    DOB: 06/06/1990, 32 y.o.   MRN: [id number]  HPI  Scott Bean is here in follow up of his TBI. He is using his communication board and visual cues to communicate with family.  With the communication board along with Ritalin he has been much more engaging with his family.  He likes to listen to music and able to pick songs from selections on his board.  He has not started singing with any of these yet.  He does make a lot of sound now and tries to pronounce words although they generally are unintelligible.  He does tend to use facial expressions and head nods along with the board to communicate what his thoughts and ideas are.  He had his baclofen pump concentration increased which has helped with the frequency of visits for refills.  He remains n.p.o.  Mother remains diligent with management of his tube feedings and the tube itself.  Mom reports no issues with new skin breakdown.  Mood has been more positive.  He is sleeping well.  He has lost some weight by design and mom feels that he is in a better mood because of that as well.  Pain Inventory Average Pain 0 Pain Right Now 0 My pain is  non verbal  BOWEL Number of stools per week: 7 Oral laxative use Yes  Type of laxative miralax  BLADDER Bladder incontinence Yes   Mobility use a wheelchair needs help with transfers  Function disabled: date disabled . I need assistance with the following:  feeding, dressing, bathing, toileting, meal prep, household duties, and shopping  Neuro/Psych bladder control problems  Prior Studies Any changes since last visit?  no  Physicians involved in your care Any changes since last visit?  no   Family History  Problem Relation Age of Onset   Lung cancer Maternal Grandmother        Died at 26   Liver cancer Maternal Grandmother    Heart Problems Maternal Grandfather        Died at 94   Seizures Neg Hx    Social History   Socioeconomic  History   Marital status: Single    Spouse name: Not on file   Number of children: Not on file   Years of education: Not on file   Highest education level: Not on file  Occupational History   Not on file  Tobacco Use   Smoking status: Never   Smokeless tobacco: Never  Vaping Use   Vaping status: Never Used  Substance and Sexual Activity   Alcohol use: No   Drug use: No   Sexual activity: Never  Other Topics Concern   Not on file  Social History Narrative      Scott Bean graduated from [employer] in 2016.    He lives with his mother.   Social Determinants of Health   Financial Resource Strain: Not on file  Food Insecurity: Not on file  Transportation Needs: Not on file  Physical Activity: Not on file  Stress: Not on file  Social Connections: Unknown (08/06/2021)   Received from [facility]   Social Network    Social Network: Not on file   Past Surgical History:  Procedure Laterality Date   decompressive craniotomy     implanation of intrathecal baclofen pump     PEG TUBE PLACEMENT     reimplantation of intrathecal baclofen pump     VENTRICULOPERITONEAL  SHUNT     Past Medical History:  Diagnosis Date   Bacteremia 12/10/2011   Cellulitis 12/27/2012   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, implant, and graft 02/06/2013   PNA (pneumonia) 12/09/2011   Traumatic brain injury (HCC)    BP 108/70   Pulse 66   Ht 6\' 7"  (2.007 m)   Wt 214 lb 6.4 oz (97.3 kg)   SpO2 98%   BMI 24.15 kg/m   Opioid Risk Score:   Fall Risk Score:  `1  Depression screen [facility] 2/9     10/16/2020    3:15 PM 10/12/2019    1:57 PM  Depression screen PHQ 2/9  Decreased Interest 0 0  Down, Depressed, Hopeless 0 0  PHQ - 2 Score 0 0  Altered sleeping 0   Tired, decreased energy 0   Change in appetite 0   Feeling bad or failure about yourself  0   Trouble concentrating 0   Moving slowly or fidgety/restless 0   Suicidal thoughts 0   PHQ-9 Score 0    Difficult doing work/chores Not difficult at all      Review of Systems  Constitutional: Negative.   HENT: Negative.    Eyes: Negative.   Respiratory: Negative.    Cardiovascular: Negative.   Gastrointestinal: Negative.   Endocrine: Negative.   Genitourinary:        Bladder  control  Musculoskeletal:        Spasticity  Skin: Negative.   Allergic/Immunologic: Negative.   Neurological:        Quad  Hematological: Negative.   Psychiatric/Behavioral: Negative.    All other systems reviewed and are negative.     Objective:   Physical Exam  General: No acute distress HEENT: NCAT, EOMI, oral membranes moist Cards: reg rate  Chest: normal effort Abdomen: Soft, NT, ND Skin: dry, intact Extremities: no edema Psych: pleasant and appropriate  Skin: Clean and intact without signs of breakdown Neuro: Scott Bean is in his wheelchair reclined.  He is making much more sounds and it appears that he is close to making words in response to questions that I am asking.  He definitely responds to comments that I make with direct eye contact.  Still has significant left visual field loss.  He has continued contractures of the fingers and wrist of both sides right more than left.  Lower extremities tight as well.  Continue clonus still present in both ankles. Musculoskeletal: Contracted joints as above.           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   Chronic aphasia/apraxia/dysarthria       Plan: Mom/dad continue with excellent care.  We discussed air mattress today and is probably worthwhile to invest in some higher quality mattresses that do not break as often potentially. Maintain heel cord stretching. Night splints are an option. Towel rolls, hand splinting to stretch finger flexors/wrists.  Communication board--SLP will work with pt to maximize benefit -continues to be more alert .  -music based activities --might do well  trying to sing songs that he likes. Mom can sing along. -maintain ritalin at 10 mg  bid after one week             -has helped initiation and spontaneous speech.              -[facility] [place]   -he has medicine  through mid October.  4.  Baclofen pump per Dr. Artis Flock.  baclofen concentration increased  5.  Seizure prophylaxis: Per Dr. Artis Flock     Twenty minutes of face to face patient care time were spent during this visit. All questions were encouraged and answered.  Follow up with me in 6 mos .

## 2023-01-08 ENCOUNTER — Telehealth (INDEPENDENT_AMBULATORY_CARE_PROVIDER_SITE_OTHER): Payer: Self-pay | Admitting: Pediatrics

## 2023-01-08 ENCOUNTER — Ambulatory Visit (INDEPENDENT_AMBULATORY_CARE_PROVIDER_SITE_OTHER): Payer: MEDICAID | Admitting: Pediatrics

## 2023-01-08 DIAGNOSIS — G8250 Quadriplegia, unspecified: Secondary | ICD-10-CM

## 2023-01-08 DIAGNOSIS — G40309 Generalized idiopathic epilepsy and epileptic syndromes, not intractable, without status epilepticus: Secondary | ICD-10-CM

## 2023-01-08 DIAGNOSIS — G808 Other cerebral palsy: Secondary | ICD-10-CM

## 2023-01-08 MED ORDER — CARBAMAZEPINE 100 MG/5ML PO SUSP
ORAL | 5 refills | Status: DC
Start: 2023-01-08 — End: 2023-07-27

## 2023-01-08 MED ORDER — BACLOFEN 5 MG PO TABS: ORAL_TABLET | ORAL | 0 refills | Status: DC

## 2023-01-08 MED ORDER — LEVETIRACETAM 100 MG/ML PO SOLN
1500.0000 mg | Freq: Two times a day (BID) | ORAL | 5 refills | Status: DC
Start: 2023-01-08 — End: 2023-07-27

## 2023-01-08 NOTE — Telephone Encounter (Signed)
  Name of who is calling: Josh from [facility] pharmacy   Caller's Relationship to Patient:   Best contact number: [phone]  Provider they see: Artis Flock   Reason for call: Called to confirm dosage on tegretol medication. Says they got a order for 15ml but pt has been on 16.68ml for years and they just want to be sure.      PRESCRIPTION REFILL ONLY  Name of prescription:  Pharmacy:

## 2023-01-08 NOTE — Telephone Encounter (Signed)
Contacted Josh from [facility] to confirm the dose change.   Pharmacy verbalized understanding of this.  SS, CCMA

## 2023-01-23 ENCOUNTER — Telehealth: Payer: Self-pay | Admitting: *Deleted

## 2023-01-23 DIAGNOSIS — S069X9S Unspecified intracranial injury with loss of consciousness of unspecified duration, sequela: Secondary | ICD-10-CM

## 2023-01-23 MED ORDER — METHYLPHENIDATE HCL 10 MG PO TABS
10.0000 mg | ORAL_TABLET | Freq: Two times a day (BID) | ORAL | 0 refills | Status: DC
Start: 2023-01-23 — End: 2023-07-08

## 2023-01-23 MED ORDER — METHYLPHENIDATE HCL 10 MG PO TABS
10.0000 mg | ORAL_TABLET | Freq: Two times a day (BID) | ORAL | 0 refills | Status: DC
Start: 2023-01-23 — End: 2023-05-26

## 2023-01-23 NOTE — Telephone Encounter (Signed)
Rx written and sent to the pharmacy. Thanks!

## 2023-01-23 NOTE — Telephone Encounter (Signed)
Joshua's mother called for a refill on his methylphenidate.

## 2023-02-14 NOTE — Progress Notes (Addendum)
   Baclofen Pump Interrogation, Programming, Refill  Date of Service: 01/08/2023  Patient Name: Scott Bean      MRN: [id number]      Date of Birth: 1990-09-29 Primary Care Physician: Raliegh Ip, DO  Provider: Lorenz Coaster MD MPH   HPI: Mother reports no concerns since last appointment.    Indications:  Congenital Quadriplegic Cerebral Palsy Time Out:: Done immediately prior to procedure  Description:  The patient was placed in the supine position. The pump was interrogated and found to have a calculated residual volume of: 2.5 ml. There was dermatitis around the pump, but no redness or edema noted at the pump site.  Prior to procedure, location of pump was estimated 5.3cm from medial line of scar and 7 mm down. The pump reservoir site was prepped with betadine and draped using sterile technique per protocol. The pump was accessed on the first attempt using the 22 gauge needle provided in the refill kit. The residual baclofen was withdrawn and measured to be: 5 ml. The pump reservoir was refilled with 40ml of baclofen 2050mcg/ml  over 2 minute using sterile technique, deaccessed and the skin cleaned. The pump was reprogrammed with new reservoir amount.  See report for details.    LOT no. for baclofen: [id number] Expiration date:12/2024    PLAN: Follow-up scheduled Continue Tegretol and Keppra at current doses.  Refills sent for both, as well as baclofen for pump failure. Contact clinic for any breakthrough seizures or new symptoms  Lorenz Coaster MD MPH [facility] Neurology, Neurodevelopment and [facility]  [address] [place], [place], [place] [address] Phone: [phone]

## 2023-03-18 ENCOUNTER — Encounter (INDEPENDENT_AMBULATORY_CARE_PROVIDER_SITE_OTHER): Payer: Self-pay

## 2023-03-18 NOTE — Progress Notes (Signed)
Is the patient/family in a moving vehicle? If yes, please ask family to pull over and park in a safe place to continue the visit.  This is a Pediatric Specialist E-Visit consult/follow up provided via My Chart Video Visit (Caregility). Scott Bean and their parent/guardian Scott Bean consented to an E-Visit consult today.  Is the patient present for the video visit? Yes Location of patient: Scott Bean is at home. Is the patient located in the state of [place]? Yes Location of provider: Milana Bean, RD is at Neurology. Patient was referred by Scott Ip, DO   This visit was done via VIDEO   Medical Nutrition Therapy - Progress Note Appt start time: 1:50 PM Appt end time: 2:12 PM Reason for referral: Gtube dependence Referring provider: Dr. Artis Bean - Neuro Pertinent medical hx: Spastic hemiplegia, epilepsy, obstructive hydrocephalus, TBI with loss of consciousness, cognitive decline, h/o gastrointestinal disease, transient alteration of awareness, urinary and fecal incontinence, high risk for pressure injury of skin, dysphagia, +gtube  Assessment: Food allergies: none Pertinent Medications: see medication list Vitamins/Supplements: none Pertinent labs:  (6/12) CBC, Thyroid Panel, Vitamin D : WNL (6/12) CMP: Serum Creatinine - 0.40 (low), BUN/Creatinine Ratio - 30 (high), Alkaline Phosphatase - 134 (high) (6/12) Lipid Panel: Total Cholesterol - 202 (high), LDL Cholesterol - 107 (high)  No anthropometrics taken on 12/17 due to virtual appointment. Most recent anthropometrics 8/20 were used to determine dietary needs.   (8/20) Anthropometrics: Ht: 200 cm   Wt: 97.2 kg (214 lb 6.4 oz) BMI: 24.1 kg IBW based on Hamwi Equation: 97 kg (+/- 10%)  11/25/22 Wt: 97.2 kg 09/16/22 Wt: 99.0 kg 08/21/22 Wt: 97.7 kg 07/23/22 Wt: 97.7 kg 02/24/22 Wt: 103.6 kg (*mom reports weight was 226#*) 01/13/22 Wt: 102 kg (226 # 6.4 oz) 12/05/21 Wt: 104.2 kg (229 # 12.8 oz) 10/24/21  Wt: 105.4 kg (231 # 9.6 oz)  UBW: ~200 #    Estimated minimum caloric needs: 1350-1450 kcal/day (based on stable weight trends on current regimen)  Estimated minimum protein needs: 0.8 g/kg/day (DRI) Estimated minimum fluid needs: 2000-2400 mL/day (25 kcal/kg)   Primary concerns today: Follow-up given pt with gtube dependence. Mom accompanied pt to appt today.   Dietary Intake Hx: DME: [employer] [facility]   Formula: [name] Standard 1.4 and [name]  Current regimen:  Day feeds: 325 mL (1 carton) via gravity feeds x 3 feeds  @ 8 AM, 1 PM, 6 PM (mom will give 1/2 then will wait 30 minutes then other 1/2 of carton) Overnight feeds: none Total Volume: 1 cartons [name], 2 carton [employer] Standard 1.4   FWF: 160 mL with each feed x3, 60 mL after each feed and after medications x4, 120 mL diet cranberry juice @ 6 AM, 180 mL water with Miralax (1200 mL total) Nutrition Supplement: 3 Prosource daily  Previous Supplements Tried: 2kcal HN (gas/bloating)   Feed positioning/location: 15-80 degree incline PO foods: none PO beverages: none  Notes: Mom reports current regimen continues to go well, noting improvements in bowel movements and Scott Bean's overall GI health. Mom's biggest concern at this time is running out of [employer] Standard 1.0 as she mentions [employer] does not send enough to cover the month.  GI: daily, improving and thickening up since starting 3 prosource/day - Miralax daily  GU: "a lot" - very clear   Physical Activity: wheelchair bound  Estimated caloric intake: 1415 kcal/day - meets 97-104% of estimated needs Estimated  protein intake: 1.0 g/kg/day - meets 125% of estimated needs Estimated fluid intake: 1922 mL/day - meets 80-96% of estimated needs  Micronutrient Intake   Vitamin A 1440 mcg  Vitamin C 150 mg  Vitamin D 21 mcg  Vitamin E 22.7 mg  Vitamin K 110 mcg  Vitamin B1 (thiamin) 1.7 mg  Vitamin B2 (riboflavin) 1.8 mg  Vitamin B3  (niacin) 21 mg  Vitamin B5 (pantothenic acid) 11 mg  Vitamin B6 2.1 mg  Vitamin B7 (biotin) 60 mcg  Vitamin B9 (folate) 838 mcg  Vitamin B12 6.3 mcg  Choline 515 mg  Calcium 1050 mg  Chromium 76 mcg  Copper 2100 mcg  Fluoride 0 mg  Iodine 170 mcg  Iron 19 mg  Magnesium 394 mg  Manganese 2.4 mg  Molybdenum 135 mcg  Phosphorous 900 mg  Selenium 73.5 mcg  Zinc 18.5 mg  Potassium 2000 mg  Sodium 1005 mg  Chloride 1387 mg  Fiber 11 g   Nutrition Diagnosis: (7/24) Inadequate oral intake related to dysphagia and NPO status as evidenced by pt dependent on Gtube feedings to meet nutritional needs.  Intervention: Discussed pt's weight trends and current regimen in detail. Discussed recommendations below. RD discussed formula shortage with mom and encouraged mom to count products monthly to ensure adequate amounts are being sent. RD provided [employer] direct contact, Scott Bean, RD for mom to talk with if needed. RD also sent case of formula via [name] website per mom's request. All questions answered, family in agreement with plan.   Nutrition Recommendations sent via mychart message: - Continue current regimen, Scott Bean sounds like he's doing great overall!  - I will send you a case of [name] to help with you running low on supply this month.  - I recommend calling our office in the next month or two to see if a new RD has been hired so Scott Bean can get on his/her schedule in the next few months.   Teach back method used.  Monitoring/Evaluation: Continue to Monitor: - Weight trends  - TF tolerance  - Need for Peptide formula  Follow-up with new RD.  Total time spent in counseling: 22 minutes.

## 2023-03-24 ENCOUNTER — Ambulatory Visit (INDEPENDENT_AMBULATORY_CARE_PROVIDER_SITE_OTHER): Payer: MEDICAID | Admitting: Dietician

## 2023-03-24 DIAGNOSIS — R1310 Dysphagia, unspecified: Secondary | ICD-10-CM | POA: Diagnosis not present

## 2023-03-24 DIAGNOSIS — Z931 Gastrostomy status: Secondary | ICD-10-CM

## 2023-03-24 DIAGNOSIS — R1312 Dysphagia, oropharyngeal phase: Secondary | ICD-10-CM

## 2023-03-24 DIAGNOSIS — R638 Other symptoms and signs concerning food and fluid intake: Secondary | ICD-10-CM | POA: Diagnosis not present

## 2023-03-24 NOTE — Patient Instructions (Signed)
Nutrition Recommendations sent via mychart message: - Continue current regimen, Scott Bean sounds like he's doing great overall!  - I will send you a case of [name] to help with you running low on supply this month.  - I recommend calling our office in the next month or two to see if a new RD has been hired so Scott Bean can get on his/her schedule in the next few months.

## 2023-03-25 ENCOUNTER — Encounter (INDEPENDENT_AMBULATORY_CARE_PROVIDER_SITE_OTHER): Payer: Self-pay | Admitting: Pediatrics

## 2023-04-28 ENCOUNTER — Telehealth (INDEPENDENT_AMBULATORY_CARE_PROVIDER_SITE_OTHER): Payer: Self-pay | Admitting: Dietician

## 2023-05-12 ENCOUNTER — Other Ambulatory Visit: Payer: Self-pay | Admitting: Family Medicine

## 2023-05-12 DIAGNOSIS — L219 Seborrheic dermatitis, unspecified: Secondary | ICD-10-CM

## 2023-05-25 ENCOUNTER — Other Ambulatory Visit: Payer: Self-pay | Admitting: Physical Medicine & Rehabilitation

## 2023-05-25 DIAGNOSIS — S069X9S Unspecified intracranial injury with loss of consciousness of unspecified duration, sequela: Secondary | ICD-10-CM

## 2023-05-27 ENCOUNTER — Encounter: Payer: MEDICAID | Admitting: Physical Medicine & Rehabilitation

## 2023-06-18 ENCOUNTER — Ambulatory Visit (INDEPENDENT_AMBULATORY_CARE_PROVIDER_SITE_OTHER): Payer: MEDICAID | Admitting: Pediatrics

## 2023-06-18 DIAGNOSIS — G40309 Generalized idiopathic epilepsy and epileptic syndromes, not intractable, without status epilepticus: Secondary | ICD-10-CM

## 2023-06-18 DIAGNOSIS — G8250 Quadriplegia, unspecified: Secondary | ICD-10-CM

## 2023-06-26 ENCOUNTER — Other Ambulatory Visit: Payer: Self-pay | Admitting: Physical Medicine & Rehabilitation

## 2023-06-26 DIAGNOSIS — S069X9S Unspecified intracranial injury with loss of consciousness of unspecified duration, sequela: Secondary | ICD-10-CM

## 2023-07-08 ENCOUNTER — Encounter: Payer: MEDICAID | Attending: Physical Medicine & Rehabilitation | Admitting: Physical Medicine & Rehabilitation

## 2023-07-08 ENCOUNTER — Encounter: Payer: Self-pay | Admitting: Physical Medicine & Rehabilitation

## 2023-07-08 DIAGNOSIS — S069X9D Unspecified intracranial injury with loss of consciousness of unspecified duration, subsequent encounter: Secondary | ICD-10-CM

## 2023-07-08 DIAGNOSIS — S069X9S Unspecified intracranial injury with loss of consciousness of unspecified duration, sequela: Secondary | ICD-10-CM | POA: Insufficient documentation

## 2023-07-08 MED ORDER — METHYLPHENIDATE HCL 10 MG PO TABS: 10.0000 mg | ORAL_TABLET | Freq: Two times a day (BID) | ORAL | 0 refills | Status: DC

## 2023-07-08 MED ORDER — METHYLPHENIDATE HCL 10 MG PO TABS
10.0000 mg | ORAL_TABLET | Freq: Two times a day (BID) | ORAL | 0 refills | Status: DC
Start: 2023-07-08 — End: 2023-09-28

## 2023-07-08 NOTE — Progress Notes (Signed)
 Subjective:    Patient ID: Scott Bean, male    DOB: 09-24-1990, 33 y.o.   MRN: [id number]  HPI  Scott Bean is here in follow-up of his traumatic brain injury.  I last saw him in August.  Mom states that he has been doing fairly well.  He has a Public affairs consultant but does not seem to like to use it.  Generally when it is offered to him he tends to turn his head the other direction.  He does enjoy listening to music and in particular guns and roses among some other bands.  He does seem to be happier and sometimes tends to make some sounds in concert with the music that were on words.  Mom and dad state that they have not heard him speak any specific words other than mama but that he often seems that he has been close at times.  From a spasticity standpoint he is essentially at baseline.  His baclofen pump has been unchanged although they moved to the higher concentration which is allowed for longer duration between visits.  Scott Bean definitely is more attentive with Ritalin on board.  He still can be very distracted and he is often distracted in particular by his take which she can pull at loose and at times.  Mom has to regularly clean the area due to some drainage from the tube onto his skin.  Overall his mood seems to be a bit better.  He is definitely more approachable from a social standpoint and even from a physical standpoint.  Mom states that at times he is even tried to assist with some of the hygiene that she does.  Pain Inventory Average Pain 0 Pain Right Now 0 My pain is  no pain  In the last 24 hours, has pain interfered with the following? General activity 0 Relation with others 0 Enjoyment of life 0 What TIME of day is your pain at its worst? No pain Sleep (in general) NA  Pain is worse with:  no pain Pain improves with:  no pain Relief from Meds:  na  Family History  Problem Relation Age of Onset   Lung cancer Maternal Grandmother        Died at 40   Liver cancer  Maternal Grandmother    Heart Problems Maternal Grandfather        Died at 64   Seizures Neg Hx    Social History   Socioeconomic History   Marital status: Single    Spouse name: Not on file   Number of children: Not on file   Years of education: Not on file   Highest education level: Not on file  Occupational History   Not on file  Tobacco Use   Smoking status: Never   Smokeless tobacco: Never  Vaping Use   Vaping status: Never Used  Substance and Sexual Activity   Alcohol use: No   Drug use: No   Sexual activity: Never  Other Topics Concern   Not on file  Social History Narrative      Scott Bean graduated from [employer] in 2016.    He lives with his mother.   Social Drivers of Corporate investment banker Strain: Not on file  Food Insecurity: Not on file  Transportation Needs: Not on file  Physical Activity: Not on file  Stress: Not on file  Social Connections: Unknown (08/06/2021)   Received from [facility], [facility]   Social Network  Social Network: Not on file   Past Surgical History:  Procedure Laterality Date   decompressive craniotomy     implanation of intrathecal baclofen pump     PEG TUBE PLACEMENT     reimplantation of intrathecal baclofen pump     VENTRICULOPERITONEAL SHUNT     Past Surgical History:  Procedure Laterality Date   decompressive craniotomy     implanation of intrathecal baclofen pump     PEG TUBE PLACEMENT     reimplantation of intrathecal baclofen pump     VENTRICULOPERITONEAL SHUNT     Past Medical History:  Diagnosis Date   Bacteremia 12/10/2011   Cellulitis 12/27/2012   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, implant, and graft 02/06/2013   PNA (pneumonia) 12/09/2011   Traumatic brain injury (HCC)    BP (!) 145/98   Pulse 94   SpO2 98%   Opioid Risk Score:   Fall Risk Score:  `1  Depression screen [facility] 2/9     10/16/2020    3:15 PM 10/12/2019    1:57 PM  Depression screen  PHQ 2/9  Decreased Interest 0 0  Down, Depressed, Hopeless 0 0  PHQ - 2 Score 0 0  Altered sleeping 0   Tired, decreased energy 0   Change in appetite 0   Feeling bad or failure about yourself  0   Trouble concentrating 0   Moving slowly or fidgety/restless 0   Suicidal thoughts 0   PHQ-9 Score 0   Difficult doing work/chores Not difficult at all      Review of Systems  Psychiatric/Behavioral:  The patient is nervous/anxious.   All other systems reviewed and are negative.      Objective:   Physical Exam General: No acute distress HEENT: NCAT, EOMI, oral membranes moist Cards: reg rate  Chest: normal effort Abdomen: Soft, NT, ND Skin: dry, intact Extremities: no edema Psych: pleasant and appropriate  Skin: Clean and intact without signs of breakdown Neuro: Scott Bean is in his wheelchair reclined.  Verbalizing more but still inaudible. Said "mama" when I sang one of his favorite songs.  Makes eye contact with verbal and tactile cues.  He has continued contractures of the fingers and wrist of both sides right more than left.  Lower extremities tight as well.  Ongoing clonus in both ankles  musculoskeletal: Continued contractures at the hands and elbows .           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   Chronic aphasia/apraxia/dysarthria       Plan: Mom and dad continue to provide high-level care. Maintain heel cord stretching. Night splints could be an option. Towel rolls, hand splinting to stretch finger flexors/wrists been discussed.  Communication board--family working on him buying in a bit more on the board -continues to be more alert .  -music based activities - family is trying.  I think this might be the key to him verbalizing more ultimately -maintain ritalin at 10 mg  bid               Medication was refilled and a second prescription was sent to the patient's pharmacy for next month.    We will  continue the controlled substance monitoring program, this consists of regular clinic visits, examinations, routine drug screening, pill counts as well as use of [place] Controlled Substance Reporting System. [place] today.   4.  Baclofen pump  per Dr. Artis Flock.  baclofen concentration increased.  Tone appears stable 5.  Seizure prophylaxis: Per Dr. Artis Flock     Twenty minutes of face to face patient care time were spent during this visit. All questions were encouraged and answered.  Follow up with me in 6 mos .

## 2023-07-08 NOTE — Progress Notes (Deleted)
 Subjective:    Patient ID: Scott Bean, male    DOB: 12-08-1990, 33 y.o.   MRN: [id number]  HPI Pain Inventory Average Pain {NUMBERS; 0-10:5044} Pain Right Now {NUMBERS; 0-10:5044} My pain is {PAIN DESCRIPTION:21022940}  In the last 24 hours, has pain interfered with the following? General activity {NUMBERS; 0-10:5044} Relation with others {NUMBERS; 0-10:5044} Enjoyment of life {NUMBERS; 0-10:5044} What TIME of day is your pain at its worst? {time of day:24191} Sleep (in general) {BHH GOOD/FAIR/POOR:22877}  Pain is worse with: {ACTIVITIES:21022942} Pain improves with: {PAIN IMPROVES WUJW:11914782} Relief from Meds: {NUMBERS; 0-10:5044}  Family History  Problem Relation Age of Onset   Lung cancer Maternal Grandmother        Died at 64   Liver cancer Maternal Grandmother    Heart Problems Maternal Grandfather        Died at 35   Seizures Neg Hx    Social History   Socioeconomic History   Marital status: Single    Spouse name: Not on file   Number of children: Not on file   Years of education: Not on file   Highest education level: Not on file  Occupational History   Not on file  Tobacco Use   Smoking status: Never   Smokeless tobacco: Never  Vaping Use   Vaping status: Never Used  Substance and Sexual Activity   Alcohol use: No   Drug use: No   Sexual activity: Never  Other Topics Concern   Not on file  Social History Narrative      [name] graduated from [employer] in 2016.    He lives with his mother.   Social Drivers of Corporate investment banker Strain: Not on file  Food Insecurity: Not on file  Transportation Needs: Not on file  Physical Activity: Not on file  Stress: Not on file  Social Connections: Unknown (08/06/2021)   [facility], [facility]   Social Network    Social Network: Not on file   Past Surgical History:  Procedure Laterality Date   decompressive craniotomy     implanation of intrathecal baclofen  pump     PEG TUBE PLACEMENT     reimplantation of intrathecal baclofen pump     VENTRICULOPERITONEAL SHUNT     Past Surgical History:  Procedure Laterality Date   decompressive craniotomy     implanation of intrathecal baclofen pump     PEG TUBE PLACEMENT     reimplantation of intrathecal baclofen pump     VENTRICULOPERITONEAL SHUNT     Past Medical History:  Diagnosis Date   Bacteremia 12/10/2011   Cellulitis 12/27/2012   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, implant, 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  Opioid Risk Score:   Fall Risk Score:  `1  Depression screen [facility] 2/9     10/16/2020    3:15 PM 10/12/2019    1:57 PM  Depression screen PHQ 2/9  Decreased Interest 0 0  Down, Depressed, Hopeless 0 0  PHQ - 2 Score 0 0  Altered sleeping 0   Tired, decreased energy 0   Change in appetite 0   Feeling bad or failure about yourself  0   Trouble concentrating 0   Moving slowly or fidgety/restless 0   Suicidal thoughts 0   PHQ-9 Score 0   Difficult doing work/chores  Not difficult at all       Review of Systems     Objective:   Physical Exam        Assessment & Plan:

## 2023-07-08 NOTE — Patient Instructions (Signed)
 ALWAYS FEEL FREE TO CALL OUR OFFICE WITH ANY PROBLEMS OR QUESTIONS [phone])  **PLEASE NOTE** ALL MEDICATION REFILL REQUESTS (INCLUDING CONTROLLED SUBSTANCES) NEED TO BE MADE AT LEAST 7 DAYS PRIOR TO REFILL BEING DUE. ANY REFILL REQUESTS INSIDE THAT TIME FRAME MAY RESULT IN DELAYS IN RECEIVING YOUR PRESCRIPTION.

## 2023-07-17 ENCOUNTER — Other Ambulatory Visit: Payer: Self-pay | Admitting: Family Medicine

## 2023-07-17 DIAGNOSIS — L219 Seborrheic dermatitis, unspecified: Secondary | ICD-10-CM

## 2023-07-27 MED ORDER — CARBAMAZEPINE 100 MG/5ML PO SUSP: ORAL | 5 refills | Status: DC

## 2023-07-27 MED ORDER — LEVETIRACETAM 100 MG/ML PO SOLN: 1500.0000 mg | Freq: Two times a day (BID) | ORAL | 5 refills | Status: DC

## 2023-07-27 NOTE — Progress Notes (Signed)
   Baclofen  Pump Interrogation, Programming, Refill  Date of Service: 06/18/2023   Patient Name: Scott Bean      MRN: [id number]      Date of Birth: 04/02/1991 Primary Care Physician: Eliodoro Guerin, DO  Provider: Marny Sires MD MPH   HPI: Mother reports no concerns since last appointment.    Indications:     Congenital Quadriplegic Cerebral Palsy Time Out::  Done immediately prior to procedure  Description:  The patient was placed in the supine position. The pump was interrogated and found to have a calculated residual volume of: 2.8 ml. There was dermatitis around the pump, but no redness or edema noted at the pump site.  Prior to procedure, location of pump was estimated 5.3cm from medial line of scar and 7 mm down. The pump reservoir site was prepped with betadine and draped using sterile technique per protocol. The pump was accessed on the first attempt using the 22 gauge needle provided in the refill kit. The residual baclofen  was withdrawn and measured to be: 7 ml. The pump reservoir was refilled with 40ml of baclofen  203mcg/ml  over 2 minute using sterile technique, deaccessed and the skin cleaned. The pump was reprogrammed with new reservoir amount.  See report for details.    LOT no. for baclofen : [id number] Expiration date:02/2025   PLAN: Follow-up scheduled  Continue Keppra  and Tegretol . Refills sent Patient family to call for any change in symptoms Recommend patient keep upcoming appointment with PM&R  Marny Sires MD MPH [facility] Neurology, Neurodevelopment and [facility]  [address] [place], [place], [place] [address] Phone: [phone]

## 2023-09-07 ENCOUNTER — Other Ambulatory Visit: Payer: Self-pay | Admitting: Family Medicine

## 2023-09-07 DIAGNOSIS — G40309 Generalized idiopathic epilepsy and epileptic syndromes, not intractable, without status epilepticus: Secondary | ICD-10-CM

## 2023-09-07 DIAGNOSIS — G8110 Spastic hemiplegia affecting unspecified side: Secondary | ICD-10-CM

## 2023-09-18 ENCOUNTER — Other Ambulatory Visit: Payer: MEDICAID

## 2023-09-18 DIAGNOSIS — G40309 Generalized idiopathic epilepsy and epileptic syndromes, not intractable, without status epilepticus: Secondary | ICD-10-CM

## 2023-09-18 DIAGNOSIS — G8110 Spastic hemiplegia affecting unspecified side: Secondary | ICD-10-CM

## 2023-09-23 ENCOUNTER — Encounter: Payer: Self-pay | Admitting: Family Medicine

## 2023-09-23 ENCOUNTER — Ambulatory Visit (INDEPENDENT_AMBULATORY_CARE_PROVIDER_SITE_OTHER): Payer: MEDICAID | Admitting: Family Medicine

## 2023-09-23 VITALS — BP 154/94 | HR 83 | Temp 98.4°F | Ht 79.0 in | Wt 215.0 lb

## 2023-09-23 DIAGNOSIS — Z931 Gastrostomy status: Secondary | ICD-10-CM

## 2023-09-23 DIAGNOSIS — I158 Other secondary hypertension: Secondary | ICD-10-CM | POA: Diagnosis not present

## 2023-09-23 DIAGNOSIS — Z0001 Encounter for general adult medical examination with abnormal findings: Secondary | ICD-10-CM

## 2023-09-23 DIAGNOSIS — Z9189 Other specified personal risk factors, not elsewhere classified: Secondary | ICD-10-CM

## 2023-09-23 DIAGNOSIS — G40309 Generalized idiopathic epilepsy and epileptic syndromes, not intractable, without status epilepticus: Secondary | ICD-10-CM

## 2023-09-23 DIAGNOSIS — Z0000 Encounter for general adult medical examination without abnormal findings: Secondary | ICD-10-CM

## 2023-09-23 DIAGNOSIS — G8250 Quadriplegia, unspecified: Secondary | ICD-10-CM

## 2023-09-23 DIAGNOSIS — N3942 Incontinence without sensory awareness: Secondary | ICD-10-CM

## 2023-09-23 DIAGNOSIS — Z978 Presence of other specified devices: Secondary | ICD-10-CM

## 2023-09-23 DIAGNOSIS — S069XAS Unspecified intracranial injury with loss of consciousness status unknown, sequela: Secondary | ICD-10-CM

## 2023-09-23 DIAGNOSIS — R4189 Other symptoms and signs involving cognitive functions and awareness: Secondary | ICD-10-CM

## 2023-09-23 DIAGNOSIS — L219 Seborrheic dermatitis, unspecified: Secondary | ICD-10-CM

## 2023-09-23 DIAGNOSIS — R159 Full incontinence of feces: Secondary | ICD-10-CM

## 2023-09-23 LAB — LIPID PANEL

## 2023-09-23 MED ORDER — CLOTRIMAZOLE-BETAMETHASONE 1-0.05 % EX CREA: TOPICAL_CREAM | Freq: Every day | CUTANEOUS | 0 refills | Status: AC | PRN

## 2023-09-23 NOTE — Progress Notes (Signed)
 Scott Bean is a 33 y.o. male presents to office today for annual physical exam examination.    Concerns today include: None.  He has been doing well.  He was placed on a diet recently and has been down 15 pounds after reducing kilocalories through his nutritional supplements that he gets from his G-tube.  His G-tube has been looking good and they only use the clotrimazole  as needed.  Needs refill on that  Occupation: n/a, Marital status: n/a, Substance use: none There are no preventive care reminders to display for this patient.  Refills needed today: cream  Immunization History  Administered Date(s) Administered   Fluzone Influenza virus vaccine,trivalent (IIV3), split virus 01/11/2013   Influenza,inj,Quad PF,6+ Mos 01/11/2013   Moderna Sars-Covid-2 Vaccination 06/22/2019, 07/19/2019   Past Medical History:  Diagnosis Date   Bacteremia 12/10/2011   Cellulitis 12/27/2012   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, implant, and graft 02/06/2013   PNA (pneumonia) 12/09/2011   Traumatic brain injury [facility])    Social History   Socioeconomic History   Marital status: Single    Spouse name: Not on file   Number of children: Not on file   Years of education: Not on file   Highest education level: Not on file  Occupational History   Not on file  Tobacco Use   Smoking status: Never   Smokeless tobacco: Never  Vaping Use   Vaping status: Never Used  Substance and Sexual Activity   Alcohol use: No   Drug use: No   Sexual activity: Never  Other Topics Concern   Not on file  Social History Narrative      Scott Bean graduated from [employer] in 2016.    He lives with his mother.   Social Drivers of Corporate investment banker Strain: Not on file  Food Insecurity: Not on file  Transportation Needs: Not on file  Physical Activity: Not on file  Stress: Not on file  Social Connections: Unknown (08/06/2021)   Received [facility]    Social Network    Social Network: Not on file  Intimate Partner Violence: Unknown (07/11/2021)   Received from [facility]   HITS    Physically Hurt: Not on file    Insult or Talk Down To: Not on file    Threaten Physical Harm: Not on file    Scream or Curse: Not on file   Past Surgical History:  Procedure Laterality Date   decompressive craniotomy     implanation of intrathecal baclofen  pump     PEG TUBE PLACEMENT     reimplantation of intrathecal baclofen  pump     VENTRICULOPERITONEAL SHUNT     Family History  Problem Relation Age of Onset   Lung cancer Maternal Grandmother        Died at 43   Liver cancer Maternal Grandmother    Heart Problems Maternal Grandfather        Died at 13   Seizures Neg Hx     Current Outpatient Medications:    Baclofen  5 MG TABS, Crush 2 tablets and dissolve in liquid.  Give per gtube TID PRN for baclofen  pump failure, Disp: 10 tablet, Rfl: 0   carBAMazepine  (TEGRETOL ) 100 MG/5ML suspension, TAKE 15 MLS EVERY 6 HOURS, Disp: 1800 mL, Rfl: 5   cetirizine  (ZYRTEC ) 10 MG chewable tablet, 10 mg by G-tube route Once Daily., Disp: , Rfl:    clotrimazole -betamethasone  (LOTRISONE ) cream, APPLY TO THE AFFECTED  AREAS DAILY AS NEEDED, Disp: 45 g, Rfl: 0   fluticasone  (FLONASE ) 50 MCG/ACT nasal spray, USE 2 SPRAYS IN EACH NOSTRIL ONCE DAILY., Disp: 16 g, Rfl: 5   Lactobacillus Rhamnosus, GG, (RA PROBIOTIC DIGESTIVE CARE) CAPS, Take 1 capsule by mouth daily., Disp: , Rfl:    levETIRAcetam  (KEPPRA ) 100 MG/ML solution, Take 15 mLs (1,500 mg total) by mouth 2 (two) times daily., Disp: 930 mL, Rfl: 5   methylphenidate  (RITALIN ) 10 MG tablet, Take 1 tablet (10 mg total) by mouth 2 (two) times daily with breakfast and lunch. At 0700 and 1200 daily, Disp: 60 tablet, Rfl: 0   methylphenidate  (RITALIN ) 10 MG tablet, Place 1 tablet (10 mg total) into feeding tube 2 (two) times daily with breakfast and lunch., Disp: 60 tablet, Rfl: 0   Nutritional Supplements (NUTRITIONAL  SUPPLEMENT PLUS) LIQD, 1 [employer] Standard 1.0 given daily by gtube through gravity feeds., Disp: 10075 mL, Rfl: 12   Nutritional Supplements (NUTRITIONAL SUPPLEMENT PLUS) LIQD, 2 Johny Nap Standard 1.4 given by gtube daily through gravity feeds, Disp: 20150 mL, Rfl: 12   Nutritional Supplements (NUTRITIONAL SUPPLEMENT PLUS) LIQD, 3 Prosource (30 mL each) given via gtube daily. Mix each pouch with at least 60 mL of water., Disp: 2790 mL, Rfl: 12   polyethylene glycol powder (GLYCOLAX/MIRALAX) 17 GM/SCOOP powder, 17 g by Per G Tube route daily as needed for Constipation., Disp: , Rfl:    RESTASIS 0.05 % ophthalmic emulsion, Place 1 drop into both eyes 2 (two) times daily., Disp: , Rfl:    tacrolimus (PROTOPIC) 0.1 % ointment, Apply topically., Disp: , Rfl:   Allergies  Allergen Reactions   Clindamycin/Lincomycin     Ointment caused increased redness     ROS: Review of Systems Pertinent items noted in HPI and remainder of comprehensive ROS otherwise negative.    Physical exam BP (!) 154/94   Pulse 83   Temp 98.4 F (36.9 C)   Ht 6' 7 (2.007 m)   Wt 215 lb (97.5 kg)   SpO2 95%   BMI 24.22 kg/m  General appearance: alert, cooperative, appears stated age, and mild distress Head: Exotropia present Eyes: Sluggish pupillary response but otherwise reactive and sclera are clear Ears: normal TM's and external ear canals both ears Nose: Nares normal. Septum midline. Mucosa normal. No drainage or sinus tenderness. Throat: Moist mucous membranes Neck: no adenopathy, supple, symmetrical, trachea midline, and thyroid  not enlarged, symmetric, no tenderness/mass/nodules Back: He has visible curvature of the spine and is wheelchair-bound Lungs: clear to auscultation bilaterally Chest wall: no tenderness Heart: regular rate and rhythm, S1, S2 normal, no murmur, click, rub or gallop Abdomen: Generally soft except for the left lower quadrant near his G-tube.  He has scarring in this area.  He has  minimal postinflammatory changes surrounding the G-tube but no active drainage, warmth or erythema Extremities: Contractures of bilateral upper extremities and lower extremities.  He has poor tone in both upper and lower extremities Pulses: 2+ and symmetric Skin: Skin surrounding G-tube as above Lymph nodes: Cervical, supraclavicular, and axillary nodes normal. Neurologic: Does respond to mother's voice.     09/23/2023    2:47 PM 10/16/2020    3:15 PM 10/12/2019    1:57 PM  Depression screen PHQ 2/9  Decreased Interest 0 0 0  Down, Depressed, Hopeless 0 0 0  PHQ - 2 Score 0 0 0  Altered sleeping 0 0   Tired, decreased energy 0 0   Change in appetite 0  0   Feeling bad or failure about yourself  0 0   Trouble concentrating 0 0   Moving slowly or fidgety/restless 0 0   Suicidal thoughts 0 0   PHQ-9 Score 0 0   Difficult doing work/chores Not difficult at all Not difficult at all       09/23/2023    2:47 PM 10/16/2020    3:16 PM  GAD 7 : Generalized Anxiety Score  Nervous, Anxious, on Edge 0 0  Control/stop worrying 0 0  Worry too much - different things 0 0  Trouble relaxing 0 0  Restless 0 0  Easily annoyed or irritable 0 0  Afraid - awful might happen 0 0  Total GAD 7 Score 0 0  Anxiety Difficulty Not difficult at all     No results found for this or any previous visit (from the past 2160 hours).  Assessment/ Plan: [place] here for annual physical exam.   Annual physical exam  Other secondary hypertension  Generalized convulsive epilepsy (HCC) - Plan: CMP14+EGFR, VITAMIN D  25 Hydroxy (Vit-D Deficiency, Fractures), Lipid Panel, CBC  Spastic tetraplegia (HCC) - Plan: CMP14+EGFR, VITAMIN D  25 Hydroxy (Vit-D Deficiency, Fractures), CBC  Cognitive deficit as late effect of traumatic brain injury (HCC) - Plan: CMP14+EGFR, VITAMIN D  25 Hydroxy (Vit-D Deficiency, Fractures), CBC  Urinary incontinence without sensory awareness - Plan: CMP14+EGFR, CBC  Full  incontinence of feces - Plan: CMP14+EGFR, CBC  At high risk for pressure injury of skin - Plan: CMP14+EGFR, CBC  S/P percutaneous endoscopic gastrostomy (PEG) tube placement (HCC) - Plan: CMP14+EGFR, VITAMIN D  25 Hydroxy (Vit-D Deficiency, Fractures), Lipid Panel, CBC  Presence of intrathecal baclofen  pump - Plan: CMP14+EGFR  Permissive hypertension per neurology as it is felt that his blood pressure is falsely elevated secondary to contractures of the upper extremities  I have ordered labs for him.  Will CC to neurology as FYI once available  Continue urinary incontinence and fecal incontinence supplies.  Will sign off on these once refill request comes in  No active pressure injuries.  Mother does a fantastic job at making sure that he offsets pressure  PEG tube looked unremarkable today.  Check vitamin levels  Continue follow-up with neurology as scheduled for maintenance of baclofen  pump and supervision of seizure disorder  Counseled on healthy lifestyle choices, including diet (rich in fruits, vegetables and lean meats and low in salt and simple carbohydrates) and exercise (at least 30 minutes of moderate physical activity daily).  Patient to follow up 1 year for CPE  Alisabeth Selkirk M. Bonnell Butcher, DO

## 2023-09-24 ENCOUNTER — Telehealth: Payer: Self-pay | Admitting: Family Medicine

## 2023-09-24 LAB — LIPID PANEL
Cholesterol, Total: 211 mg/dL — ABNORMAL HIGH (ref 100–199)
HDL: 47 mg/dL (ref >39)
LDL CALC COMMENT:: 4.5 ratio (ref 0.0–5.0)
LDL Chol Calc (NIH): 122 mg/dL — ABNORMAL HIGH (ref 0–99)
Triglycerides: 238 mg/dL — ABNORMAL HIGH (ref 0–149)
VLDL Cholesterol Cal: 42 mg/dL — ABNORMAL HIGH (ref 5–40)

## 2023-09-24 LAB — CBC
Hematocrit: 45.9 % (ref 37.5–51.0)
Hemoglobin: 15.9 g/dL (ref 13.0–17.7)
MCH: 31.9 pg (ref 26.6–33.0)
MCHC: 34.6 g/dL (ref 31.5–35.7)
MCV: 92 fL (ref 79–97)
Platelets: 316 10*3/uL (ref 150–450)
RBC: 4.98 x10E6/uL (ref 4.14–5.80)
RDW: 13.2 % (ref 11.6–15.4)
WBC: 6.1 10*3/uL (ref 3.4–10.8)

## 2023-09-24 LAB — CMP14+EGFR
ALT: 34 IU/L (ref 0–44)
AST: 10 IU/L (ref 0–40)
Albumin: 4.3 g/dL (ref 4.1–5.1)
Alkaline Phosphatase: 110 IU/L (ref 44–121)
BUN/Creatinine Ratio: 45 — ABNORMAL HIGH (ref 9–20)
BUN: 14 mg/dL (ref 6–20)
Bilirubin Total: <0.2 mg/dL (ref 0.0–1.2)
CO2: 23 mmol/L (ref 20–29)
Calcium: 9.2 mg/dL (ref 8.7–10.2)
Chloride: 96 mmol/L (ref 96–106)
Creatinine, Ser: 0.31 mg/dL — ABNORMAL LOW (ref 0.76–1.27)
Globulin, Total: 2.8 g/dL (ref 1.5–4.5)
Glucose: 100 mg/dL — ABNORMAL HIGH (ref 70–99)
Potassium: 3.9 mmol/L (ref 3.5–5.2)
Sodium: 136 mmol/L (ref 134–144)
Total Protein: 7.1 g/dL (ref 6.0–8.5)
eGFR: 161 mL/min/{1.73_m2} (ref >59)

## 2023-09-24 LAB — VITAMIN D 25 HYDROXY (VIT D DEFICIENCY, FRACTURES): Vit D, 25-Hydroxy: 36.8 ng/mL (ref 30.0–100.0)

## 2023-09-24 NOTE — Telephone Encounter (Signed)
 Will let Diego Foy know when she is back in the office on Monday  Copied from CRM #[id number]. Topic: Compliment - Staff >> Sep 24, 2023 12:20 PM Lizabeth Riggs wrote: Date of encounter: 09/23/23 Details of compliment: Almira Armour, [name]'s mom, wants to bake Danielle in the lab a cake for always finding a vein to draw blood for [place]. Almira Armour said [name] is a hard stick.  Who would the patient like to thank/see rewarded? Danielle in lab On a scale of 1-10, how was your experience? 10  Route to Research officer, political party.   ----------------------------------------------------------------------- >> Sep 24, 2023 12:40 PM Lizabeth Riggs wrote: Almira Armour wants Danielle to call her and let her know her favorite cake.

## 2023-09-25 ENCOUNTER — Ambulatory Visit: Payer: Self-pay | Admitting: Family Medicine

## 2023-09-28 ENCOUNTER — Other Ambulatory Visit: Payer: Self-pay | Admitting: Physical Medicine & Rehabilitation

## 2023-09-28 DIAGNOSIS — S069X9S Unspecified intracranial injury with loss of consciousness of unspecified duration, sequela: Secondary | ICD-10-CM

## 2023-10-05 ENCOUNTER — Telehealth (INDEPENDENT_AMBULATORY_CARE_PROVIDER_SITE_OTHER): Payer: Self-pay | Admitting: Pediatrics

## 2023-10-05 NOTE — Telephone Encounter (Signed)
 Mom called again and stated the date she was given was 11/26/23 at [facility]

## 2023-10-05 NOTE — Telephone Encounter (Signed)
  Name of who is calling: Scott Bean Relationship to Patient: mom   Best contact number: [phone]  Provider they see: waddell  Reason for call: called to see about pump refill date, she would like a call back regarding this. Says She was given a date at last appt but is not sure what happened.      PRESCRIPTION REFILL ONLY  Name of prescription:  Pharmacy:

## 2023-10-05 NOTE — Telephone Encounter (Signed)
 Contacted patients mother. Verified patients name and DOB as well as mothers name.   Scheduled patients refill for 8.18.2025 at 9:30 AM.  SS, CCMA

## 2023-10-22 ENCOUNTER — Telehealth: Payer: Self-pay

## 2023-10-22 NOTE — Telephone Encounter (Signed)
 Written rx given to [facility]

## 2023-10-22 NOTE — Telephone Encounter (Signed)
 Rx faxed to [place] at [phone]

## 2023-10-22 NOTE — Telephone Encounter (Signed)
 Copied from CRM [id number]. Topic: General - Other >> Oct 22, 2023  2:10 PM Miquel SAILOR wrote: Reason for CRM: Patient mother Donzell calling on order for patients Milk. Need call back ASAP on update [phone]  Milk Kfarm 1.0 /Kfarm 1.4  Needs to be sent to  [facility] [phone]

## 2023-10-26 ENCOUNTER — Encounter: Payer: Self-pay | Admitting: Family Medicine

## 2023-10-27 ENCOUNTER — Telehealth (INDEPENDENT_AMBULATORY_CARE_PROVIDER_SITE_OTHER): Payer: Self-pay | Admitting: Pediatrics

## 2023-10-27 NOTE — Telephone Encounter (Signed)
 Called mom back.  Scheduled a Nutrition appointment with Bruna for 7.31 at 1:30 PM,   Informed mom of providers being out of the office but will respond as soon as they can.  Mom verbalized understanding of this.  SS, CCMA

## 2023-10-27 NOTE — Telephone Encounter (Signed)
  Name of who is calling: Scott Bean Relationship to Patient: mom   Best contact number: [phone]  Provider they see: Waddell   Reason for call: mom states that patient is needing help getting feeding supplies/formula from [facility]. She states they are needing a prior authorization. She also has questions to as if patient needs to follow up from when he last saw [place]. She would like a call back to confirm.      PRESCRIPTION REFILL ONLY  Name of prescription:  Pharmacy:

## 2023-10-27 NOTE — Telephone Encounter (Signed)
 Contacted patients mother.  Verified patients name and DOB as well as mothers name. Mom stated that he has been out of the 1.0 for a few days, she has been giving 1.4 and she fears that he will gain back the weight that he lost.   She stated that he ordered the his tube feeding supplies last week and still has not heard back from them. When she was told that the ordered need a PA, she asked his PCP to initiate the PA they did. She has been calling [facility] everyday and they keep telling her the order will arrive in the next business day.   Mom is asking if the nutritionist can give them an emergency order.   I informed mom that she wouldn't be able to do so until she see's him in clinic. Mom is asking for a joint visit on 8.18.2025.  Informed mom that I would ask and that I would also send this message to Mrs. Ellouise to see what can be done in the meantime.   Mom verbalized understanding of this.   SS, CCMA

## 2023-10-28 ENCOUNTER — Other Ambulatory Visit: Payer: Self-pay | Admitting: Physical Medicine & Rehabilitation

## 2023-10-28 DIAGNOSIS — S069X9S Unspecified intracranial injury with loss of consciousness of unspecified duration, sequela: Secondary | ICD-10-CM

## 2023-10-28 NOTE — Telephone Encounter (Signed)
 I called and spoke with Mom. Scott Bean received his shipment of formula today.

## 2023-11-05 ENCOUNTER — Ambulatory Visit (INDEPENDENT_AMBULATORY_CARE_PROVIDER_SITE_OTHER): Payer: MEDICAID

## 2023-11-23 ENCOUNTER — Ambulatory Visit (INDEPENDENT_AMBULATORY_CARE_PROVIDER_SITE_OTHER): Payer: MEDICAID | Admitting: Pediatrics

## 2023-11-23 VITALS — Wt 207.0 lb

## 2023-11-23 DIAGNOSIS — G8250 Quadriplegia, unspecified: Secondary | ICD-10-CM

## 2023-11-23 DIAGNOSIS — G808 Other cerebral palsy: Secondary | ICD-10-CM | POA: Diagnosis not present

## 2023-11-23 DIAGNOSIS — G40309 Generalized idiopathic epilepsy and epileptic syndromes, not intractable, without status epilepticus: Secondary | ICD-10-CM

## 2023-11-26 ENCOUNTER — Other Ambulatory Visit: Payer: Self-pay | Admitting: Physical Medicine & Rehabilitation

## 2023-11-26 DIAGNOSIS — S069X9S Unspecified intracranial injury with loss of consciousness of unspecified duration, sequela: Secondary | ICD-10-CM

## 2023-11-27 NOTE — Telephone Encounter (Signed)
 Please send Rx today he will take his last dose tomorrow. Thank you

## 2023-12-26 ENCOUNTER — Other Ambulatory Visit: Payer: Self-pay | Admitting: Physical Medicine & Rehabilitation

## 2023-12-26 DIAGNOSIS — S069X9S Unspecified intracranial injury with loss of consciousness of unspecified duration, sequela: Secondary | ICD-10-CM

## 2023-12-28 NOTE — Telephone Encounter (Signed)
 Please advise of refill request for patient

## 2024-01-06 ENCOUNTER — Encounter: Payer: MEDICAID | Attending: Physical Medicine & Rehabilitation | Admitting: Physical Medicine & Rehabilitation

## 2024-01-06 ENCOUNTER — Ambulatory Visit: Payer: Self-pay

## 2024-01-06 ENCOUNTER — Encounter: Payer: Self-pay | Admitting: Physical Medicine & Rehabilitation

## 2024-01-06 ENCOUNTER — Encounter: Payer: Self-pay | Admitting: Family Medicine

## 2024-01-06 DIAGNOSIS — S069X9S Unspecified intracranial injury with loss of consciousness of unspecified duration, sequela: Secondary | ICD-10-CM | POA: Insufficient documentation

## 2024-01-06 DIAGNOSIS — S069X9D Unspecified intracranial injury with loss of consciousness of unspecified duration, subsequent encounter: Secondary | ICD-10-CM

## 2024-01-06 MED ORDER — METHYLPHENIDATE HCL 10 MG PO TABS: 10.0000 mg | ORAL_TABLET | Freq: Two times a day (BID) | ORAL | 0 refills | Status: DC

## 2024-01-06 NOTE — Progress Notes (Signed)
 Subjective:    Patient ID: Scott Bean, male    DOB: 05-May-1990, 33 y.o.   MRN: [id number]  HPI  Scott Bean is here in follow-up of his traumatic brain injury.  Things have been fairly stable since we last met 6 months ago.  He does seem to be engaging more from a verbal and cognitive standpoint.  He is trying to speak now although it is often unintelligible.  He likes music and sometimes will sing when music is going on.  He watched television as well.  He remains on Ritalin  for his attention and initiation.  He takes Tegretol  for seizure prophylaxis.  Mom denies any new bowel or bladder problems nor any issues with his skin.  He does seem to have oily skin along his face and back which they have seen someone for.  Sleep is fairly regular.  Wheelchair remains functional and in good condition.  Pain Inventory Average Pain 0 Pain Right Now 0 My pain is N/A  In the last 24 hours, has pain interfered with the following? General activity N/A Relation with others N/A Enjoyment of life N/A What TIME of day is your pain at its worst? N/A Sleep (in general) N/A  Pain is worse with: N/A Pain improves with: N/A Relief from Meds: N/A  Family History  Problem Relation Age of Onset   Lung cancer Maternal Grandmother        Died at 10   Liver cancer Maternal Grandmother    Heart Problems Maternal Grandfather        Died at 34   Seizures Neg Hx    Social History   Socioeconomic History   Marital status: Single    Spouse name: Not on file   Number of children: Not on file   Years of education: Not on file   Highest education level: Not on file  Occupational History   Not on file  Tobacco Use   Smoking status: Never   Smokeless tobacco: Never  Vaping Use   Vaping status: Never Used  Substance and Sexual Activity   Alcohol use: No   Drug use: No   Sexual activity: Never  Other Topics Concern   Not on file  Social History Narrative      Scott Bean graduated from [employer]  in 2016.    He lives with his mother.   Social Drivers of Corporate investment banker Strain: Not on file  Food Insecurity: Not on file  Transportation Needs: Not on file  Physical Activity: Not on file  Stress: Not on file  Social Connections: Unknown (08/06/2021)   [facility]   Social Network    Social Network: Not on file   Past Surgical History:  Procedure Laterality Date   decompressive craniotomy     implanation of intrathecal baclofen  pump     PEG TUBE PLACEMENT     reimplantation of intrathecal baclofen  pump     VENTRICULOPERITONEAL SHUNT     Past Surgical History:  Procedure Laterality Date   decompressive craniotomy     implanation of intrathecal baclofen  pump     PEG TUBE PLACEMENT     reimplantation of intrathecal baclofen  pump     VENTRICULOPERITONEAL SHUNT     Past Medical History:  Diagnosis Date   Bacteremia 12/10/2011   Cellulitis 12/27/2012   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, implant, and graft 02/06/2013   PNA (pneumonia) 12/09/2011   Traumatic brain injury (  HCC)    BP (!) 156/106 (BP Location: Left Arm, Patient Position: Sitting, Cuff Size: Normal)   Pulse 82   Ht 6' 7 (2.007 m)   Wt 207 lb (93.9 kg)   SpO2 95%   BMI 23.32 kg/m   Opioid Risk Score:   Fall Risk Score:  `1  Depression screen [facility] 2/9     09/23/2023    2:47 PM 10/16/2020    3:15 PM 10/12/2019    1:57 PM  Depression screen PHQ 2/9  Decreased Interest 0 0 0  Down, Depressed, Hopeless 0 0 0  PHQ - 2 Score 0 0 0  Altered sleeping 0 0   Tired, decreased energy 0 0   Change in appetite 0 0   Feeling bad or failure about yourself  0 0   Trouble concentrating 0 0   Moving slowly or fidgety/restless 0 0   Suicidal thoughts 0 0   PHQ-9 Score 0 0   Difficult doing work/chores Not difficult at all Not difficult at all      Review of Systems  All other systems reviewed and are negative.      Objective:   Physical Exam General:  No acute distress HEENT: NCAT, EOMI, oral membranes moist Cards: reg rate  Chest: normal effort Abdomen: Soft, NT, ND Skin: dry, intact Extremities: no edema Psych: pleasant and appropriate  Skin: Clean and intact without signs of breakdown Neuro: Scott Bean is in his wheelchair reclined.  Verbalizing more but still inaudible. Scott Bean when I sang one of his favorite songs.  Makes eye contact with verbal and tactile cues.  He has continued contractures of the fingers and wrist of both sides right more than left.  Lower extremities tight as well.  + clonus in both ankles  Musculoskeletal: Continued contractures at the hands and elbows .           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   Chronic aphasia/apraxia/dysarthria 5.   Elevated blood pressure       Plan: Mom and dad continue to provide high-level care. Maintain heel cord stretching as possible. Skin is in good shape Towel rolls, hand splinting to stretch finger flexors/wrists been discussed.  Communication board--family working on him buying in a bit more on the board -is more alert and verbalizing more.  -music based activities   -continue ritalin  at 10 mg bid               Medication was refilled and a second prescription was sent to the patient's pharmacy for next month.               We will continue the controlled substance monitoring program, this consists of regular clinic visits, examinations, routine drug screening, pill counts as well as use of [place]  Controlled Substance Reporting System. [place].    4.  Baclofen  pump per Dr. Waddell.  baclofen  concentration increased.    -Tone is unchanged. He has contracture 5.  Seizure prophylaxis: Per Dr. Waddell      Twenty minutes of face to face patient care time were spent during this visit. All questions were encouraged and answered.  Follow up with me in 6 mos .

## 2024-01-06 NOTE — Patient Instructions (Signed)
 ALWAYS FEEL FREE TO CALL OUR OFFICE WITH ANY PROBLEMS OR QUESTIONS [phone])  **PLEASE NOTE** ALL MEDICATION REFILL REQUESTS (INCLUDING CONTROLLED SUBSTANCES) NEED TO BE MADE AT LEAST 7 DAYS PRIOR TO REFILL BEING DUE. ANY REFILL REQUESTS INSIDE THAT TIME FRAME MAY RESULT IN DELAYS IN RECEIVING YOUR PRESCRIPTION.

## 2024-01-06 NOTE — Telephone Encounter (Signed)
 Please offer either DOD tomorrow or double book our 330 Friday and we'll see him then for manual recheck. His care is complicated by contractures so BP may not be dependable

## 2024-01-06 NOTE — Telephone Encounter (Signed)
 FYI Only or Action Required?: FYI only for provider.  Patient was last seen in primary care on 09/23/2023 by Jolinda Norene HERO, DO.  Called Nurse Triage reporting Hypertension.  Symptoms began today.  Interventions attempted: Nothing.  Symptoms are: stable.  Triage Disposition: See PCP Within 2 Weeks  Patient/caregiver understands and will follow disposition?: Yes  Copied from CRM [id number]. Topic: Clinical - Red Word Triage >> Jan 06, 2024  3:43 PM Scott Bean wrote: Red Word that prompted transfer to Nurse Triage: High BP Reason for Disposition  [1] Systolic BP >= 130 OR Diastolic >= 80 AND [2] not taking BP medications  Answer Assessment - Initial Assessment Questions Had appointment with Babs today and elevated bp of 156/104 and provider stated it shouldn't be anything to worry about, mother calls concerned and states she would like to just follow up about it with provider. Denies sxs  1. BLOOD PRESSURE: What is your blood pressure? Did you take at least two measurements 5 minutes apart?     156/104 at provider appointment today 2. ONSET: When did you take your blood pressure?     11:20 today at appointment 3. HOW: How did you take your blood pressure? (e.g., automatic home BP monitor, visiting nurse)     @ provider appointment 4. HISTORY: Do you have Bean history of high blood pressure?     Unsure 5. MEDICINES: Are you taking any medicines for blood pressure? Have you missed any doses recently?     Denies 6. OTHER SYMPTOMS: Do you have any symptoms? (e.g., blurred vision, chest pain, difficulty breathing, headache, weakness)     denies  Protocols used: Blood Pressure - High-Bean-AH

## 2024-01-06 NOTE — Telephone Encounter (Signed)
 Appointment scheduled with Dr. Zollie for 01/07/2024 at 3:05pm.

## 2024-01-07 ENCOUNTER — Ambulatory Visit (INDEPENDENT_AMBULATORY_CARE_PROVIDER_SITE_OTHER): Payer: MEDICAID | Admitting: Family Medicine

## 2024-01-07 ENCOUNTER — Encounter: Payer: Self-pay | Admitting: Family Medicine

## 2024-01-07 ENCOUNTER — Ambulatory Visit: Payer: MEDICAID | Admitting: Family Medicine

## 2024-01-07 VITALS — BP 171/125

## 2024-01-07 DIAGNOSIS — I10 Essential (primary) hypertension: Secondary | ICD-10-CM | POA: Diagnosis not present

## 2024-01-07 DIAGNOSIS — G40309 Generalized idiopathic epilepsy and epileptic syndromes, not intractable, without status epilepticus: Secondary | ICD-10-CM | POA: Diagnosis not present

## 2024-01-07 DIAGNOSIS — S069XAS Unspecified intracranial injury with loss of consciousness status unknown, sequela: Secondary | ICD-10-CM

## 2024-01-07 DIAGNOSIS — R4189 Other symptoms and signs involving cognitive functions and awareness: Secondary | ICD-10-CM

## 2024-01-07 MED ORDER — CLONIDINE HCL 0.1 MG PO TABS
0.1000 mg | ORAL_TABLET | Freq: Two times a day (BID) | ORAL | 1 refills | Status: DC
Start: 2024-01-07 — End: 2024-02-01
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
## 2024-01-07 NOTE — Telephone Encounter (Signed)
 Appointment scheduled with Dr. Zollie for 01/07/2024

## 2024-01-10 ENCOUNTER — Encounter: Payer: Self-pay | Admitting: Family Medicine

## 2024-01-11 ENCOUNTER — Other Ambulatory Visit: Payer: Self-pay | Admitting: Family Medicine

## 2024-01-13 ENCOUNTER — Ambulatory Visit: Payer: MEDICAID | Admitting: Family Medicine

## 2024-01-28 ENCOUNTER — Other Ambulatory Visit (INDEPENDENT_AMBULATORY_CARE_PROVIDER_SITE_OTHER): Payer: Self-pay | Admitting: Pediatrics

## 2024-01-28 ENCOUNTER — Encounter (INDEPENDENT_AMBULATORY_CARE_PROVIDER_SITE_OTHER): Payer: Self-pay | Admitting: Pediatrics

## 2024-01-28 DIAGNOSIS — G40309 Generalized idiopathic epilepsy and epileptic syndromes, not intractable, without status epilepticus: Secondary | ICD-10-CM

## 2024-01-28 MED ORDER — LEVETIRACETAM 100 MG/ML PO SOLN: 1500.0000 mg | Freq: Two times a day (BID) | ORAL | 5 refills | Status: DC

## 2024-01-28 MED ORDER — CARBAMAZEPINE 100 MG/5ML PO SUSP: ORAL | 5 refills | Status: DC

## 2024-01-28 NOTE — Progress Notes (Signed)
   Baclofen  Pump Interrogation, Programming, Refill  Date of Service: 11/23/2023   Patient Name: Scott Bean      MRN: [id number]      Date of Birth: [birth year] Primary Care Physician: Jolinda Norene HERO, DO  Provider: Corean Geralds MD MPH   HPI: Mother reports no concerns since last appointment.    Indications:  Congenital Quadriplegic Cerebral Palsy Time Out::  Done immediately prior to procedure  Description:  The patient was placed in the supine position. The pump was interrogated and found to have a calculated residual volume of: 3.5 ml. There was no redness or edema noted at the pump site. Continued skin iritation on the abdomen outside of the injection site. Prior to procedure, location of pump was estimated 5.3cm from medial line of scar and 7 mm down. The pump reservoir site was prepped with betadine and draped using sterile technique per protocol. The pump was accessed on the first attempt using the 22 gauge needle provided in the refill kit. The residual baclofen  was withdrawn and measured to be: 9 ml. The pump reservoir was refilled with 40ml of baclofen  2022mcg/ml  over 1 minute using sterile technique, deaccessed and the skin cleaned. The pump was reprogrammed with new reservoir amount.See report for details.    LOT no. for baclofen : [id number] Expiration date:07/2025      PLAN: Follow-up scheduled.  Continue Tegretol  and Keppra . Patient will run out of refills before next appointment. Note placed to refill in October.  Discussed in-home physical therapy. Will consider at next appointment.  Contact clinic for change insymptoms  Corean Geralds MD MPH [facility] Neurology, Neurodevelopment and [facility]  [address] [place], [place], [place] [address] Phone: [phone]

## 2024-01-28 NOTE — Progress Notes (Signed)
 Refilled medications

## 2024-02-01 ENCOUNTER — Encounter: Payer: Self-pay | Admitting: Family Medicine

## 2024-02-01 ENCOUNTER — Telehealth: Payer: Self-pay | Admitting: Family Medicine

## 2024-02-01 ENCOUNTER — Ambulatory Visit (INDEPENDENT_AMBULATORY_CARE_PROVIDER_SITE_OTHER): Payer: MEDICAID | Admitting: Family Medicine

## 2024-02-01 ENCOUNTER — Telehealth (INDEPENDENT_AMBULATORY_CARE_PROVIDER_SITE_OTHER): Payer: Self-pay

## 2024-02-01 VITALS — BP 170/109 | Temp 98.3°F

## 2024-02-01 DIAGNOSIS — I10 Essential (primary) hypertension: Secondary | ICD-10-CM | POA: Diagnosis not present

## 2024-02-01 MED ORDER — AMLODIPINE BESYLATE 2.5 MG PO TABS: ORAL_TABLET | ORAL | 0 refills | Status: DC

## 2024-02-01 MED ORDER — AMLODIPINE 1 MG/ML ORAL SUSPENSION: ORAL | 0 refills | Status: AC

## 2024-02-01 NOTE — Telephone Encounter (Signed)
 Copied from CRM [id number]. Topic: Clinical - Medication Question >> Feb 01, 2024  3:18 PM Scott Bean wrote: Reason for CRM: pt mom called back to advise of his sodium. The sodium to tube feed is serve 2 cartons of 1.4 and that has 350 miligrams each carton.  1 Carton of 1.0 and it has 200 mg of sodium in it.   Please advise if you need additional information.

## 2024-02-01 NOTE — Telephone Encounter (Signed)
  Name of who is calling:   Caller's Relationship to Patient: PCP   Best contact number: [phone]  Provider they see: [place]   Reason for call: PCP had a question regarding the feeding solution. Pt was just diagnosed with high blood pressure and wanted to know if there was any salt in the solution, and if so could there be a reduction. She is requesting a call back.      PRESCRIPTION REFILL ONLY  Name of prescription:  Pharmacy:

## 2024-02-01 NOTE — Progress Notes (Signed)
 Subjective: CC:HTN PCP: Jolinda Norene HERO, DO YEP:Fjktzoo C Hjort is a 33 y.o. male presenting to clinic today for:  Patient was started on clonidine about 3 weeks ago for blood pressure that was 171/125.  His mother had noticed some elevations in blood pressure incidentally.  He has not been acting any differently than his norm.  No edema.  Does not look distressed.  She is not sure if his feeds contain salt or not.  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  Give per gtube TID PRN for baclofen  pump failure, Disp: 10 tablet, Rfl: 0   carBAMazepine  (TEGRETOL ) 100 MG/5ML suspension, TAKE 15 MLS EVERY 6 HOURS, Disp: 1800 mL, Rfl: 5   cetirizine  (ZYRTEC ) 10 MG chewable tablet, 10 mg by G-tube route Once Daily., Disp: , Rfl:    cloNIDine (CATAPRES) 0.1 MG tablet, Take 1 tablet (0.1 mg total) by mouth 2 (two) times daily. For Blood Pressure, Disp: 60 tablet, Rfl: 1   clotrimazole -betamethasone  (LOTRISONE ) cream, Apply topically daily as needed (g tube rash)., Disp: 45 g, Rfl: 0   fluticasone  (FLONASE ) 50 MCG/ACT nasal spray, USE 2 SPRAYS IN EACH NOSTRIL ONCE DAILY., Disp: 16 g, Rfl: 5   Lactobacillus Rhamnosus, GG, (RA PROBIOTIC DIGESTIVE CARE) CAPS, Take 1 capsule by mouth daily., Disp: , Rfl:    levETIRAcetam  (KEPPRA ) 100 MG/ML solution, Take 15 mLs (1,500 mg total) by mouth 2 (two) times daily., Disp: 930 mL, Rfl: 5   methylphenidate  (RITALIN ) 10 MG tablet, Take 1 tablet (10 mg total) by mouth 2 (two) times daily with breakfast  and lunch., Disp: 60 tablet, Rfl: 0   methylphenidate  (RITALIN ) 10 MG tablet, Place 1 tablet (10 mg total) into feeding tube 2 (two) times daily with breakfast and lunch., Disp: 60 tablet, Rfl: 0   Nutritional Supplements (NUTRITIONAL SUPPLEMENT PLUS) LIQD, 1 [employer] Standard 1.0 given daily by gtube through gravity feeds., Disp: 10075 mL, Rfl: 12   Nutritional Supplements (NUTRITIONAL SUPPLEMENT PLUS) LIQD, 2 Mallie Pinion Standard 1.4 given by gtube daily through gravity feeds, Disp: 20150 mL, Rfl: 12   Nutritional Supplements (NUTRITIONAL SUPPLEMENT PLUS) LIQD, 3 Prosource (30 mL each) given via gtube daily. Mix each pouch with at least 60 mL of water., Disp: 2790 mL, Rfl: 12   polyethylene glycol powder (GLYCOLAX/MIRALAX) 17 GM/SCOOP powder, 17 g by Per G Tube route daily as needed for Constipation., Disp: , Rfl:    RESTASIS 0.05 % ophthalmic emulsion, Place 1 drop into both eyes 2 (two) times daily., Disp: , Rfl:    tacrolimus (PROTOPIC) 0.1 % ointment, Apply topically., Disp: , Rfl:  Social History   Socioeconomic History   Marital status: Single    Spouse name: Not on file   Number of children: Not on file   Years of education: Not on file   Highest education level: 12th grade  Occupational History   Not on file  Tobacco Use   Smoking status: Never   Smokeless tobacco: Never  Vaping Use  Vaping status: Never Used  Substance and Sexual Activity   Alcohol use: No   Drug use: No   Sexual activity: Never  Other Topics Concern   Not on file  Social History Narrative      [name] graduated from [employer] in 2016.    He lives with his mother.   Social Drivers of Health   Financial Resource Strain: Patient Declined (01/29/2024)   Overall Financial Resource Strain (CARDIA)    Difficulty of Paying Living Expenses: Patient declined  Food Insecurity: Patient Declined (01/29/2024)   Hunger Vital Sign    Worried About Running Out of Food in the Last Year: Patient declined     Ran Out of Food in the Last Year: Patient declined  Transportation Needs: Patient Declined (01/29/2024)   PRAPARE - Administrator, Civil Service (Medical): Patient declined    Lack of Transportation (Non-Medical): Patient declined  Physical Activity: Inactive (01/29/2024)   Exercise Vital Sign    Days of Exercise per Week: 0 days    Minutes of Exercise per Session: Not on file  Stress: Not on file  Social Connections: Unknown (01/29/2024)   Social Connection and Isolation Panel    Frequency of Communication with Friends and Family: Never    Frequency of Social Gatherings with Friends and Family: Patient declined    Attends Religious Services: More than 4 times per year    Active Member of [employer] or Organizations: No    Attends Engineer, Structural: Not on file    Marital Status: Patient declined  Intimate Partner Violence: Unknown (07/11/2021)   Received from [facility]   HITS    Physically Hurt: Not on file    Insult or Talk Down To: Not on file    Threaten Physical Harm: Not on file    Scream or Curse: Not on file   Family History  Problem Relation Age of Onset   Lung cancer Maternal Grandmother        Died at 103   Liver cancer Maternal Grandmother    Heart Problems Maternal Grandfather        Died at 22   Seizures Neg Hx     Objective: Office vital signs reviewed. BP (!) 170/109   Temp 98.3 F (36.8 C)   Physical Examination:  General: Awake, alert, nontoxic male, No acute distress HEENT: sclera white, MMM Cardio: regular rate and rhythm, S1S2 heard, no murmurs appreciated Pulm: clear to auscultation bilaterally, no wheezes, rhonchi or rales; normal work of breathing on room air Extremities: Cool, No edema, cyanosis or clubbing; +2 pulses bilaterally MSK: Contractures of bilateral upper extremities with muscle wasting of the lower extremities.   Assessment/ Plan: 33 y.o. male   Essential (primary) hypertension - Plan: amLODIPine  (KATERZIA) 1 mg/mL SUSP oral suspension, DISCONTINUED: amLODipine (NORVASC) 2.5 MG tablet   Blood pressure not at goal.  Again difficult given the contractures to determine exactly the most accurate blood pressure but we did do a thigh cuff blood pressure measurement and this was about the same level as what they are seeing at home and on the upper extremity cuff.  Given the risk of rebound hypertension with clonidine and the uncertain absorption of this I have switched him over to amlodipine and mom can cross taper this for the next couple of days just to ensure no withdrawal symptoms.  The amlodipine was prescribed as a suspension for ease of administration through his PEG tube as he cannot  swallow pills.  She will send me a 2-week blood pressure history and we can titrate beyond 5 mg if needed.  I have also reached out to the nutritionist to see the sodium content in his feeds and to see if maybe there is any room for reduction there   Janayia Burggraf CHRISTELLA Fielding, DO [facility] [phone]

## 2024-02-03 NOTE — Telephone Encounter (Signed)
 Left message advising of Dr. Melba recommendation.

## 2024-02-03 NOTE — Telephone Encounter (Signed)
 Just wanting to know if the nutritionist may be able to reduce the salt slightly since he has high BP now.

## 2024-02-04 ENCOUNTER — Other Ambulatory Visit (HOSPITAL_COMMUNITY): Payer: Self-pay

## 2024-02-24 ENCOUNTER — Encounter: Payer: Self-pay | Admitting: Family Medicine

## 2024-02-24 NOTE — Telephone Encounter (Signed)
 Please route to prior auth team. I haven't seen anything come through for him.

## 2024-02-25 ENCOUNTER — Other Ambulatory Visit: Payer: Self-pay | Admitting: Family Medicine

## 2024-02-25 DIAGNOSIS — I10 Essential (primary) hypertension: Secondary | ICD-10-CM

## 2024-03-02 ENCOUNTER — Other Ambulatory Visit: Payer: Self-pay | Admitting: Family Medicine

## 2024-03-02 MED ORDER — AMLODIPINE BESYLATE 2.5 MG PO TABS: 2.5000 mg | ORAL_TABLET | Freq: Every day | ORAL | 0 refills | Status: DC

## 2024-03-02 NOTE — Telephone Encounter (Signed)
 Call from [place] at [facility], PA was originally sent but we did not rcv, will resend today to my fax and I will send on to PA Team. But he will have to order the oral suspension. Can the Amlodipine  tablets be refilled in the meantime till the PA & order are done. Please advise

## 2024-03-02 NOTE — Telephone Encounter (Signed)
 done

## 2024-03-15 ENCOUNTER — Other Ambulatory Visit: Payer: Self-pay | Admitting: Family Medicine

## 2024-03-16 ENCOUNTER — Telehealth: Payer: Self-pay | Admitting: Pharmacy Technician

## 2024-03-16 ENCOUNTER — Other Ambulatory Visit (HOSPITAL_COMMUNITY): Payer: Self-pay

## 2024-03-16 NOTE — Telephone Encounter (Signed)
 I have teams messaged the PA team this morning about the PA that I sent from [facility] on 02/25/24. They do not see it in [employer] (electronic fax). I have given them the name of the medication. Can we refill the tablets again Please advise

## 2024-03-16 NOTE — Telephone Encounter (Signed)
 Pharmacy Patient Advocate Encounter   Received notification from Physician's Office that prior authorization for Katerzia 1MG /ML suspension is required/requested.   Insurance verification completed.   The patient is insured through [employer].   Per test claim: PA required; PA started via CoverMyMeds. KEY [id number] . Waiting for clinical questions to populate.

## 2024-03-16 NOTE — Telephone Encounter (Signed)
Noted  -LS

## 2024-03-17 ENCOUNTER — Other Ambulatory Visit (HOSPITAL_COMMUNITY): Payer: Self-pay

## 2024-03-17 NOTE — Telephone Encounter (Signed)
 Pharmacy Patient Advocate Encounter  Received notification from [facility] that Prior Authorization for Katerzia 1MG /ML suspension has been DENIED.  Full denial letter will be uploaded to the media tab. See denial reason below.     PA #/Case ID/Reference #: [id number]

## 2024-03-28 ENCOUNTER — Other Ambulatory Visit: Payer: Self-pay | Admitting: Family Medicine

## 2024-03-28 ENCOUNTER — Other Ambulatory Visit: Payer: Self-pay | Admitting: Physical Medicine & Rehabilitation

## 2024-03-28 DIAGNOSIS — S069X9S Unspecified intracranial injury with loss of consciousness of unspecified duration, sequela: Secondary | ICD-10-CM

## 2024-03-28 NOTE — Telephone Encounter (Signed)
 Can you please help with this?  Patient has a g tube.

## 2024-03-29 ENCOUNTER — Telehealth: Payer: Self-pay

## 2024-03-29 NOTE — Telephone Encounter (Signed)
 Sent in 30 more tablets until we can get a better solution for this.

## 2024-03-29 NOTE — Telephone Encounter (Signed)
 Pt's mother called to report that he will be out by christmas. Pt's mother does not want him to run out.

## 2024-03-29 NOTE — Telephone Encounter (Signed)
 Copied from CRM #[id number]. Topic: Clinical - Prescription Issue >> Mar 29, 2024  3:32 PM Montie POUR wrote: Reason for CRM:  Donzell, mom, is calling about amLODipine  (NORVASC ) 2.5 MG tablet. She needs to pick up medication today since the holidays are here and she cannot go tomorrow. Charts states 03/28/24 1302 Reorder Ina Craven D, LPN To Nmizm:[id number] And it has not been send to her pharmacy. amLODipine  (NORVASC ) 2.5 MG tablet 30 tablet 0 03/16/2024 -  Sig: TAKE TWO TABLETS DAILY AS DIRECTED UNTIL START SUSPENSION  Sent to pharmacy as: amLODipine  (NORVASC ) 2.5 MG tablet  E-Prescribing Status: Receipt confirmed by pharmacy (03/16/2024  8:59 AM EST)   Please check on medication and if there is a problem, please call Donzell at [phone]. Thanks

## 2024-03-29 NOTE — Telephone Encounter (Signed)
 Left message for Scott Bean to call back. Scott Bean will need to call the pharmacy to see if they will let her pick up the medication early.

## 2024-03-29 NOTE — Telephone Encounter (Signed)
 Refill sent per [facility].

## 2024-04-15 ENCOUNTER — Other Ambulatory Visit: Payer: Self-pay | Admitting: Family Medicine

## 2024-04-15 NOTE — Telephone Encounter (Signed)
 Copied from CRM #[id number]. Topic: Clinical - Medication Refill >> Apr 15, 2024  8:38 AM Ivette P wrote: Medication: amLODipine  (NORVASC ) 2.5 MG tablet  Has the patient contacted their pharmacy? Yes (Agent: If no, request that the patient contact the pharmacy for the refill. If patient does not wish to contact the pharmacy document the reason why and proceed with request.) (Agent: If yes, when and what did the pharmacy advise?)  This is the patient's preferred pharmacy:  [facility] [place], [place] - [address] [place] [address] Phone: [phone] Fax: [phone]  Is this the correct pharmacy for this prescription? Yes If no, delete pharmacy and type the correct one.   Has the prescription been filled recently? No  Is the patient out of the medication? Yes, pt only has enough for tomorrow  Has the patient been seen for an appointment in the last year OR does the patient have an upcoming appointment? Yes  Can we respond through MyChart? Yes  Agent: Please be advised that Rx refills may take up to 3 business days. We ask that you follow-up with your pharmacy.

## 2024-04-26 ENCOUNTER — Other Ambulatory Visit: Payer: Self-pay | Admitting: Physical Medicine & Rehabilitation

## 2024-04-26 ENCOUNTER — Telehealth: Payer: Self-pay | Admitting: Family Medicine

## 2024-04-26 DIAGNOSIS — S069X9S Unspecified intracranial injury with loss of consciousness of unspecified duration, sequela: Secondary | ICD-10-CM

## 2024-04-26 MED ORDER — AMLODIPINE BESYLATE 2.5 MG PO TABS: 2.5000 mg | ORAL_TABLET | Freq: Two times a day (BID) | ORAL | 0 refills | Status: AC

## 2024-04-26 MED ORDER — METHYLPHENIDATE HCL 10 MG PO TABS: 10.0000 mg | ORAL_TABLET | Freq: Two times a day (BID) | ORAL | 0 refills | Status: AC

## 2024-04-26 NOTE — Telephone Encounter (Signed)
 done

## 2024-04-26 NOTE — Telephone Encounter (Signed)
 Copied from CRM [id number]. Topic: Clinical - Prescription Issue >> Apr 26, 2024 12:04 PM Sophia H wrote: Reason for CRM: Mother states that : amLODipine  (NORVASC ) 2.5 MG tablet RX at pharmacy is written incorrectly. Patient takes 2.5MG  twice a day, not just once. Needs corrected before bad weather.   Advised of 3 business day turnaround.   [facility] [place], [place] - [address]

## 2024-05-04 NOTE — Progress Notes (Signed)
 "  Medical Nutrition Therapy - Initial Assessment  Appt start time: 10:00 AM Appt end time: 10:30 AM Reason for referral: G-tube feedings Referring provider: Corean Geralds, MD  Pertinent medical hx: TBI, Spastic tetraplegia, hydrocephalus, epilepsy, GERD, high blood pressure, dysphagia, G-tube  Food allergies/contraindications: NKA  Pertinent Medications: see medication list  Vitamins/Supplements: fish oil (1.5 tsp), elderberry drops  Pertinent labs: 09/23/23  Component Ref Range & Units     Glucose 100 High   BUN 14  Creatinine, Ser 0.31 Low   eGFR 161  BUN/Creatinine Ratio 45 High   Sodium 136  Potassium 3.9  Chloride 96  CO2 23  Calcium 9.2  Total Protein 7.1  Albumin 4.3  Globulin, Total 2.8  Bilirubin Total <0.2  Alkaline Phosphatase 110  AST 10  ALT 34   Component Ref Range & Units    Cholesterol, Total 211 High   Triglycerides 238 High   HDL 47  VLDL Cholesterol Cal 42 High   LDL Chol Calc (NIH) 122 High   Chol/HDL Ratio 4.5   Component Ref Range & Units   Vit D, 25-Hydroxy 36.8    Component Ref Range & Units   WBC 6.1  RBC 4.98  Hemoglobin 15.9  Hematocrit 45.9  MCV 92  MCH 31.9  MCHC 34.6  RDW 13.2  Platelets 316    Notes: Scott Bean, 34 y.o., seen in person today accompanied by mother for an initial appointment regarding G-tube feedings. Mom reported that Scott Bean is tolerating feeds well. She is worried about his blood pressure, but is aware that his current diet does not have excessive salt. He continues NPO. He receives an enema daily, but no other GI issues reported. Mom had no additional questions or concerns at this time.   Nutrition Assessment:  Anthropometrics:  Wt Readings from Last 5 Encounters:  05/05/24 207 lb (93.9 kg)  01/06/24 207 lb (93.9 kg)  11/23/23 207 lb (93.9 kg)  09/23/23 215 lb (97.5 kg)  11/26/22 214 lb 6.4 oz (97.3 kg)   Ht Readings from Last 5 Encounters:  01/06/24 6' 7 (2.007 m)  09/23/23 6' 7  (2.007 m)  11/26/22 6' 7 (2.007 m)  09/16/22 6' 7 (2.007 m)  07/23/22 6' 7 (2.007 m)   BMI Readings from Last 5 Encounters:  05/05/24 23.32 kg/m  01/06/24 23.32 kg/m  11/23/23 23.32 kg/m  09/23/23 24.22 kg/m  11/26/22 24.15 kg/m   Estimated minimum needs: Based on weight 93.9 kg Calories: 1225 kcal/day (based on previous regimen) Protein: 0.8 g/kg/day (DRI) Fluids: 1225 mL/day (1 mL/kcal)  Feeding Hx: (From previous records)  RD note 03/24/23: DME: [facility]    Formula: Scott Bean Standard 1.4 and Scott Bean Standard 1.0  Current regimen:  Day feeds: 325 mL (1 carton) via gravity feeds x 3 feeds  @ 8 AM, 1 PM, 6 PM (mom will give 1/2 then will wait 30 minutes then other 1/2 of carton) Overnight feeds: none Total Volume: 1 cartons Scott Bean Standard 1.0, 2 carton Scott Bean Standard 1.4              FWF: 160 mL with each feed x3, 60 mL after each feed and after medications x4, 120 mL diet cranberry juice @ 6 AM, 180 mL water with Miralax (1200 mL total) Nutrition Supplement: 3 Prosource daily   Feed positioning/location: 15-80 degree incline PO foods: none PO beverages: none  Dietary Intake Hx:  DME: [facility]  Formula: 1 carton Scott Bean Standard  1.0 and 2 cartons Scott Bean Standard 1.4  Current regimen:  Day feeds: 325 mL via bolus x 3 feeds (8 AM, 1 PM, 6 PM)  FWF: 160 mL with each feed x3, 60 mL after each feed and after medications x4, 120 mL diet cranberry juice @ 6 AM, 180 mL water with Miralax (1200 mL total) Supplements: 3 Prosource, fish oil (1.5 tsp), black elderberry  Provides: 975 mL, 1235 + 180 = 1415 kcal, 101 g of protein (1.07 g/kg), and 1919 mL based on wt 93.9 kg.  Previous Supplements Tried: 2kcal HN (gas/bloating)   PO foods/beverages: none  GI: enema daily  GU: no concerns N/V: none  Estimated intake likely meeting needs given stable weight.   Nutrition Diagnosis: Inadequate oral intake related to dysphagia as evidenced  by pt dependent on G-tube feedings to meet nutritional needs. (Ongoing)  Intervention: Discussed pt's weight and current regimen. Discussed nutritional needs. Discussed altered labs. Discusses adding more fiber to Scott diet and possibly choosing a different formula after labs are rechecked. Discussed recommendations below. All questions answered, family in agreement with plan.   Nutrition Recommendations: - Start adding one tsp of Benefiber to each of his feeds daily.  - Continue current feeding regimen. We will discuss an alternative feeding plan at next visit if needed.   Teach back method used.  Monitoring/Evaluation: Continue to Monitor: - Growth trends  - TF tolerance - Lower carb formula options - Labs - A1C  Follow-up in 6 months joint with Dr. Waddell.  Total time spent in chart review, face-to-face counseling, and documentation: 45 minutes. "

## 2024-05-05 ENCOUNTER — Ambulatory Visit (INDEPENDENT_AMBULATORY_CARE_PROVIDER_SITE_OTHER): Payer: MEDICAID | Admitting: Pediatrics

## 2024-05-05 ENCOUNTER — Ambulatory Visit (INDEPENDENT_AMBULATORY_CARE_PROVIDER_SITE_OTHER): Payer: Self-pay

## 2024-05-05 VITALS — Wt 207.0 lb

## 2024-05-05 DIAGNOSIS — G40909 Epilepsy, unspecified, not intractable, without status epilepticus: Secondary | ICD-10-CM

## 2024-05-05 DIAGNOSIS — I10 Essential (primary) hypertension: Secondary | ICD-10-CM | POA: Diagnosis not present

## 2024-05-05 DIAGNOSIS — G40309 Generalized idiopathic epilepsy and epileptic syndromes, not intractable, without status epilepticus: Secondary | ICD-10-CM

## 2024-05-05 DIAGNOSIS — R1310 Dysphagia, unspecified: Secondary | ICD-10-CM

## 2024-05-05 DIAGNOSIS — Z931 Gastrostomy status: Secondary | ICD-10-CM | POA: Diagnosis not present

## 2024-05-05 DIAGNOSIS — R638 Other symptoms and signs concerning food and fluid intake: Secondary | ICD-10-CM

## 2024-05-05 DIAGNOSIS — R1312 Dysphagia, oropharyngeal phase: Secondary | ICD-10-CM

## 2024-05-05 MED ORDER — CARBAMAZEPINE 100 MG/5ML PO SUSP: ORAL | 5 refills | Status: AC

## 2024-05-05 MED ORDER — BACLOFEN 5 MG PO TABS: ORAL_TABLET | ORAL | 0 refills | Status: AC

## 2024-05-05 MED ORDER — LEVETIRACETAM 100 MG/ML PO SOLN: 1500.0000 mg | Freq: Two times a day (BID) | ORAL | 5 refills | Status: AC

## 2024-05-05 NOTE — Progress Notes (Unsigned)
" ° °  Baclofen  Pump Interrogation, Programming, Refill  Date of Service: 05/05/2024   Patient Name: Scott Bean      MRN: [id number]      Date of Birth: 1990-09-28 Primary Care Physician: Jolinda Norene HERO, DO  Provider: Corean Geralds MD MPH   HPI: ***Mother reports no concerns since last appointment.    Indications:     ***Congenital Quadriplegic Cerebral Palsy Time Out:: *** Done immediately prior to procedure  Description:  The patient was placed in the supine position. The pump was interrogated and found to have a calculated residual volume of: *** ml. There was *** no redness or edema noted at the pump site. The pump reservoir site was prepped with betadine and draped using sterile technique per protocol. The pump was accessed on the *** first attempt using the 22 gauge needle provided in the refill kit. The residual baclofen  was withdrawn and measured to be: *** ml. The pump reservoir was refilled with *** of baclofen  *** 207mcg/ml  over *** minute using sterile technique, deaccessed and the skin cleaned. The pump was reprogrammed with new reservoir amount and ***.  See report for details.    LOT no. for baclofen : *** Expiration date:***   PLAN: Follow-up scheduled Continue ***. Refills ***  Parents to call for any signs of infection, overdose, or withdrawal.   Corean Geralds MD MPH [facility] Neurology, Neurodevelopment and [facility]  [address] [place], [place], [place] [address] Phone: [phone]  "

## 2024-05-05 NOTE — Patient Instructions (Addendum)
 Formula: Scott Bean Standard 1.4 and Scott Bean Standard 1.0  Day feeds: 325 mL (1 carton) via gravity feeds x 3 feeds  @ 8 AM, 1 PM, 6 PM (mom will give 1/2 then will wait 30 minutes then other 1/2 of carton) Total Volume: 1 cartons [facility] Standard 1.0, 2 carton [facility] Standard 1.4              FWF: 160 mL with each feed x3, 60 mL after each feed and after medications x4, 120 mL diet cranberry juice @ 6 AM, 180 mL water with Miralax (1200 mL total) Nutrition Supplement: 3 Prosource daily; Benefiber 1 tsp 3x/day with feeds

## 2024-07-06 ENCOUNTER — Ambulatory Visit: Payer: MEDICAID | Admitting: Physical Medicine & Rehabilitation

## 2024-09-30 ENCOUNTER — Encounter: Payer: MEDICAID | Admitting: Family Medicine

## 2024-10-13 ENCOUNTER — Ambulatory Visit (INDEPENDENT_AMBULATORY_CARE_PROVIDER_SITE_OTHER): Payer: Self-pay

## 2024-10-13 ENCOUNTER — Encounter (INDEPENDENT_AMBULATORY_CARE_PROVIDER_SITE_OTHER): Payer: Self-pay | Admitting: Pediatrics
